# Patient Record
Sex: Male | Born: 1935 | Race: White | Hispanic: No | Smoking: Former smoker
Health system: Southern US, Community
[De-identification: ages and names within clinical notes are randomized; demographics above are authoritative.]

## PROBLEM LIST (undated history)

## (undated) DIAGNOSIS — N289 Disorder of kidney and ureter, unspecified: Secondary | ICD-10-CM

## (undated) DIAGNOSIS — I4891 Unspecified atrial fibrillation: Secondary | ICD-10-CM

## (undated) DIAGNOSIS — I2510 Atherosclerotic heart disease of native coronary artery without angina pectoris: Secondary | ICD-10-CM

## (undated) DIAGNOSIS — I4510 Unspecified right bundle-branch block: Secondary | ICD-10-CM

## (undated) DIAGNOSIS — I6529 Occlusion and stenosis of unspecified carotid artery: Secondary | ICD-10-CM

## (undated) DIAGNOSIS — I10 Essential (primary) hypertension: Secondary | ICD-10-CM

## (undated) DIAGNOSIS — M549 Dorsalgia, unspecified: Secondary | ICD-10-CM

## (undated) DIAGNOSIS — E7800 Pure hypercholesterolemia, unspecified: Secondary | ICD-10-CM

## (undated) DIAGNOSIS — E785 Hyperlipidemia, unspecified: Secondary | ICD-10-CM

## (undated) DIAGNOSIS — G4710 Hypersomnia, unspecified: Secondary | ICD-10-CM

## (undated) HISTORY — PX: CHOLECYSTECTOMY: SHX55

## (undated) HISTORY — PX: INTRAOCULAR LENS INSERTION: SHX110

## (undated) HISTORY — DX: Occlusion and stenosis of unspecified carotid artery: I65.29

## (undated) HISTORY — DX: Essential (primary) hypertension: I10

## (undated) HISTORY — DX: Dorsalgia, unspecified: M54.9

## (undated) HISTORY — DX: Atherosclerotic heart disease of native coronary artery without angina pectoris: I25.10

## (undated) HISTORY — PX: HERNIA REPAIR: SHX51

## (undated) HISTORY — DX: Hypersomnia, unspecified: G47.10

## (undated) HISTORY — DX: Pure hypercholesterolemia, unspecified: E78.00

## (undated) HISTORY — PX: CAROTID ENDARTERECTOMY: SUR193

## (undated) HISTORY — DX: Unspecified atrial fibrillation: I48.91

## (undated) HISTORY — DX: Disorder of kidney and ureter, unspecified: N28.9

## (undated) HISTORY — PX: CORONARY ARTERY BYPASS GRAFT: SHX141

## (undated) HISTORY — DX: Hyperlipidemia, unspecified: E78.5

## (undated) HISTORY — DX: Unspecified right bundle-branch block: I45.10

---

## 2006-09-15 ENCOUNTER — Encounter: Admission: RE | Admit: 2006-09-15 | Discharge: 2006-09-15 | Payer: Self-pay | Admitting: Cardiology

## 2007-10-12 ENCOUNTER — Encounter: Admission: RE | Admit: 2007-10-12 | Discharge: 2007-10-12 | Payer: Self-pay | Admitting: Cardiology

## 2007-10-19 ENCOUNTER — Inpatient Hospital Stay (HOSPITAL_COMMUNITY): Admission: RE | Admit: 2007-10-19 | Discharge: 2007-10-20 | Payer: Self-pay | Admitting: Cardiology

## 2007-11-07 ENCOUNTER — Ambulatory Visit (HOSPITAL_COMMUNITY): Admission: RE | Admit: 2007-11-07 | Discharge: 2007-11-07 | Payer: Self-pay | Admitting: Cardiology

## 2007-11-25 ENCOUNTER — Inpatient Hospital Stay (HOSPITAL_COMMUNITY): Admission: EM | Admit: 2007-11-25 | Discharge: 2007-12-02 | Payer: Self-pay | Admitting: Emergency Medicine

## 2007-11-25 ENCOUNTER — Encounter (INDEPENDENT_AMBULATORY_CARE_PROVIDER_SITE_OTHER): Payer: Self-pay | Admitting: Internal Medicine

## 2007-12-01 ENCOUNTER — Ambulatory Visit: Payer: Self-pay | Admitting: Infectious Disease

## 2007-12-13 ENCOUNTER — Ambulatory Visit (HOSPITAL_COMMUNITY): Admission: RE | Admit: 2007-12-13 | Discharge: 2007-12-13 | Payer: Self-pay | Admitting: Surgery

## 2008-01-03 ENCOUNTER — Ambulatory Visit (HOSPITAL_COMMUNITY): Admission: RE | Admit: 2008-01-03 | Discharge: 2008-01-03 | Payer: Self-pay | Admitting: General Surgery

## 2008-02-03 ENCOUNTER — Encounter: Admission: RE | Admit: 2008-02-03 | Discharge: 2008-02-03 | Payer: Self-pay | Admitting: Gastroenterology

## 2008-04-28 ENCOUNTER — Inpatient Hospital Stay (HOSPITAL_COMMUNITY): Admission: EM | Admit: 2008-04-28 | Discharge: 2008-05-16 | Payer: Self-pay | Admitting: Emergency Medicine

## 2008-05-07 ENCOUNTER — Encounter (INDEPENDENT_AMBULATORY_CARE_PROVIDER_SITE_OTHER): Payer: Self-pay | Admitting: Surgery

## 2008-05-07 ENCOUNTER — Encounter: Payer: Self-pay | Admitting: Surgery

## 2008-07-05 ENCOUNTER — Emergency Department (HOSPITAL_COMMUNITY): Admission: EM | Admit: 2008-07-05 | Discharge: 2008-07-06 | Payer: Self-pay | Admitting: Emergency Medicine

## 2008-07-09 ENCOUNTER — Inpatient Hospital Stay (HOSPITAL_COMMUNITY): Admission: EM | Admit: 2008-07-09 | Discharge: 2008-07-24 | Payer: Self-pay | Admitting: Emergency Medicine

## 2008-07-13 ENCOUNTER — Ambulatory Visit: Payer: Self-pay | Admitting: Surgery

## 2008-07-17 ENCOUNTER — Encounter: Payer: Self-pay | Admitting: Thoracic Surgery

## 2008-08-01 ENCOUNTER — Ambulatory Visit: Payer: Self-pay | Admitting: Thoracic Surgery

## 2008-08-01 ENCOUNTER — Encounter: Admission: RE | Admit: 2008-08-01 | Discharge: 2008-08-01 | Payer: Self-pay | Admitting: Thoracic Surgery

## 2008-08-29 ENCOUNTER — Ambulatory Visit: Payer: Self-pay | Admitting: Thoracic Surgery

## 2008-08-29 ENCOUNTER — Encounter: Admission: RE | Admit: 2008-08-29 | Discharge: 2008-08-29 | Payer: Self-pay | Admitting: Thoracic Surgery

## 2008-10-10 ENCOUNTER — Ambulatory Visit: Payer: Self-pay | Admitting: Thoracic Surgery

## 2008-10-10 ENCOUNTER — Encounter: Admission: RE | Admit: 2008-10-10 | Discharge: 2008-10-10 | Payer: Self-pay | Admitting: Thoracic Surgery

## 2009-04-05 ENCOUNTER — Ambulatory Visit (HOSPITAL_COMMUNITY): Admission: RE | Admit: 2009-04-05 | Discharge: 2009-04-06 | Payer: Self-pay | Admitting: General Surgery

## 2009-06-18 ENCOUNTER — Ambulatory Visit: Payer: Self-pay | Admitting: Thoracic Surgery

## 2009-06-18 ENCOUNTER — Encounter: Admission: RE | Admit: 2009-06-18 | Discharge: 2009-06-18 | Payer: Self-pay | Admitting: Thoracic Surgery

## 2009-07-30 IMAGING — CR DG CHEST 1V PORT
1 series · 1 of 1 positions shown · non-contrast
Comparison: 04/30/2008

CLINICAL DATA: Back and side pain.  Shortness of breath.

PORTABLE CHEST - 1 VIEW

[view not recorded]
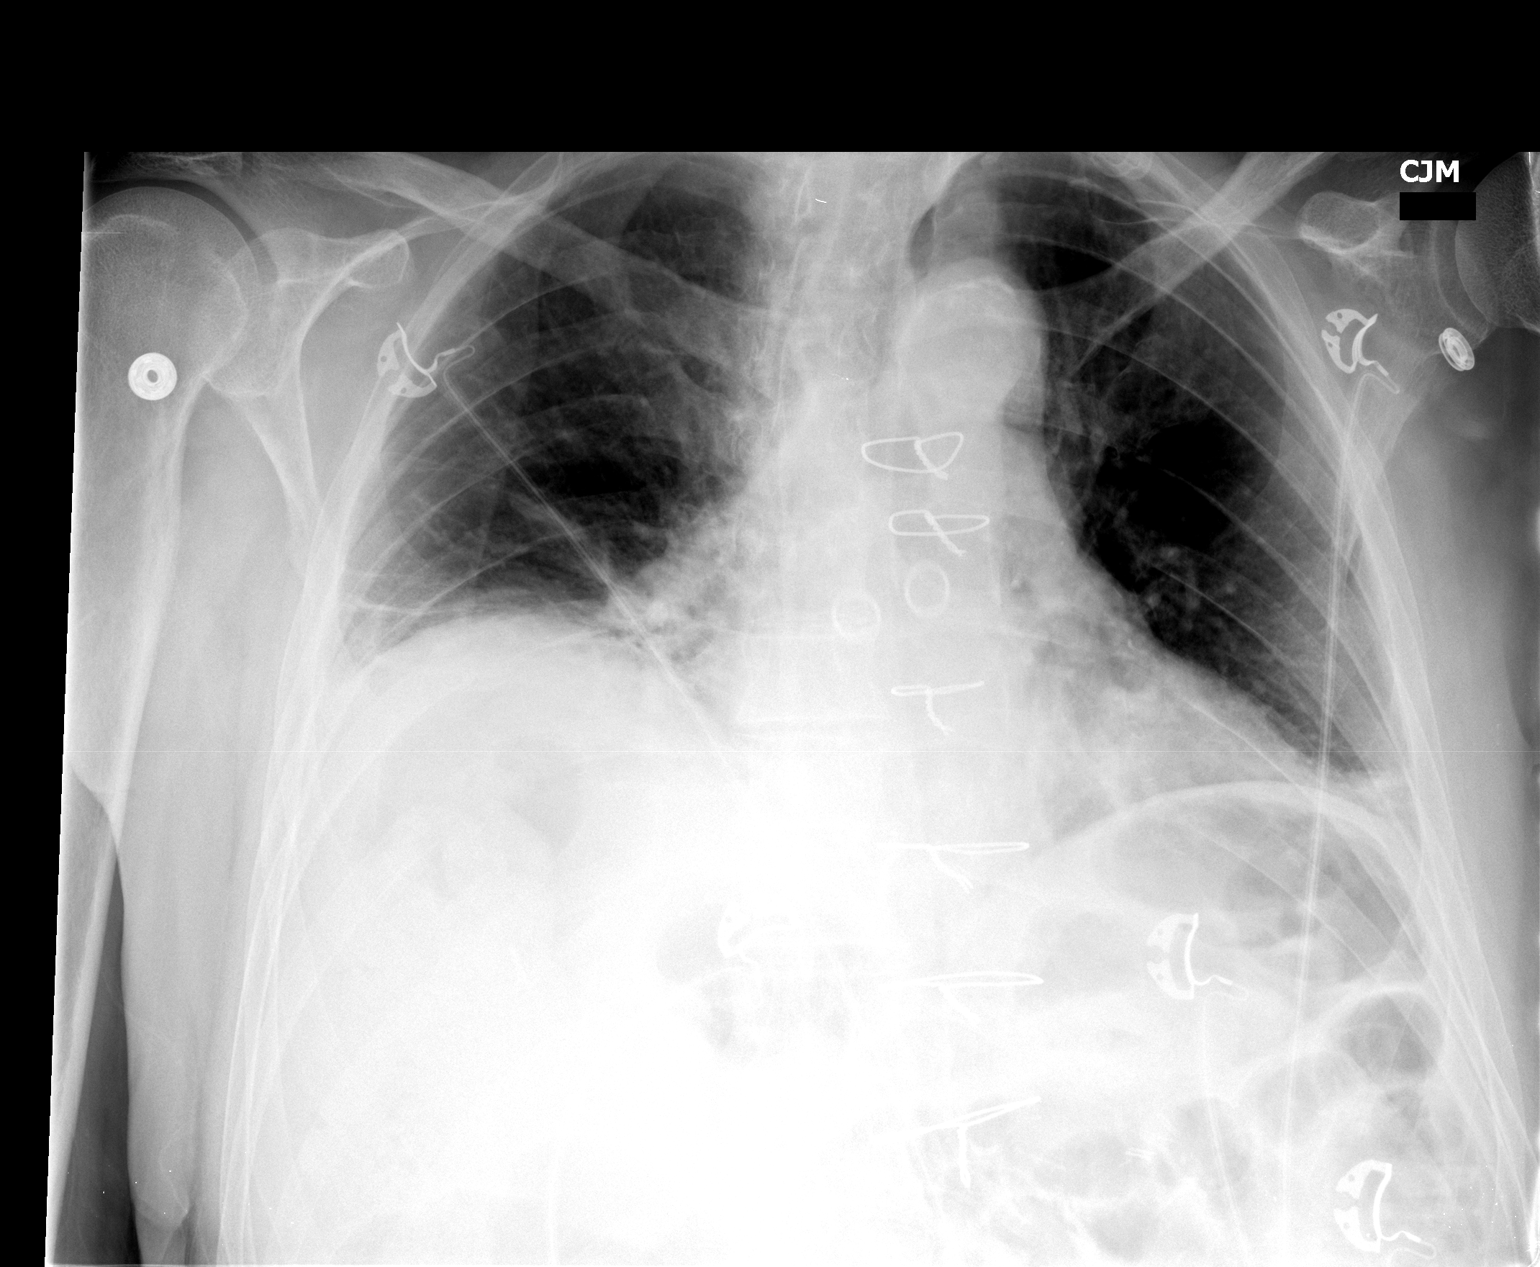

[1 of 1 positions shown; findings below may reference images not displayed]

FINDINGS: Single view of the chest demonstrates stable elevation of
the right hemidiaphragm.  There appears to be increased basilar
atelectasis, particularly on the right side.  The patient is status
post median sternotomy.  No evidence for frank pulmonary edema or
focal airspace disease.
IMPRESSION: Low lung volumes with bibasilar atelectasis.

## 2009-08-02 IMAGING — CR DG CHEST 1V PORT
1 series · 1 of 1 positions shown · non-contrast
Comparison: 07/05/2008

CLINICAL DATA: Pleuritic chest pain.  Weakness.  Shortness of
breath.

PORTABLE CHEST - 1 VIEW

[AP]
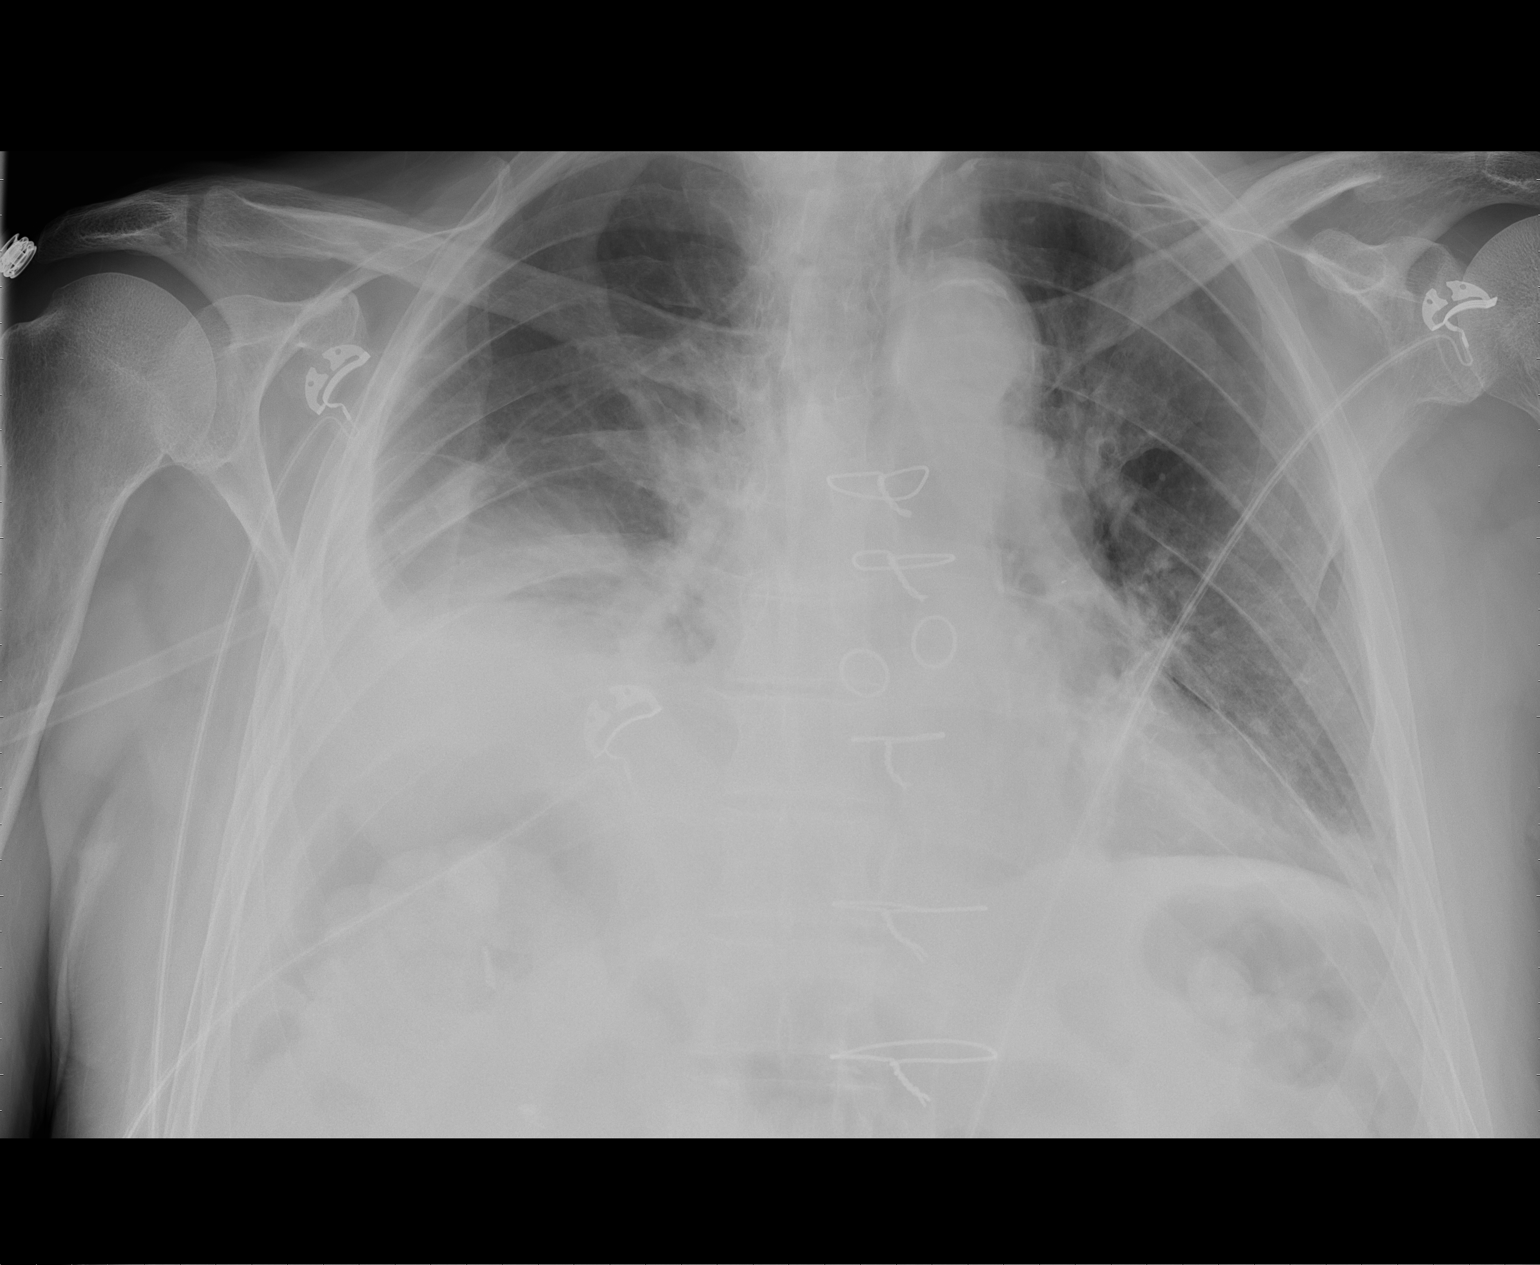

[1 of 1 positions shown; findings below may reference images not displayed]

FINDINGS: Right pleural effusion with right basilar atelectasis is
again noted.  There continue to be opacities at the left lung base,
similar to the prior exam, probably reflecting atelectasis.  Mildly
elevated right hemidiaphragm is again noted.

Prior CABG noted. Low lung volumes are present, causing crowding of
the pulmonary vasculature.
IMPRESSION: 1.  Airspace opacity the right lung base with right pleural
effusion and patchy opacities at the left lung base.  These
findings are not significantly changed compared to the CT scan from
07/05/2008.

## 2009-08-03 IMAGING — NM NM LIVER FUNCTION STUDY
2 series · 12 of 12 positions shown · non-contrast
Comparison: 05/09/2008 and 05/01/2008

CLINICAL DATA: Bile leak.

NM LIVER FUNCTION STUDY
TECHNIQUE: Sequential abdominal images were obtained for
approximately 60 minutes following intravenous injection of
radiopharmaceutical.
Radiopharmaceutical: 5 mCi technetium Choletec.

[Series 1: he hepatobiliary · 3.22mm/px · 6 of 56 frames shown (1 of 2)]
[frame 5/56]
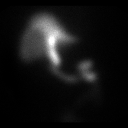
[frame 14/56]
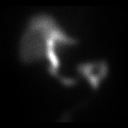
[frame 24/56]
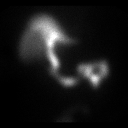
[frame 33/56]
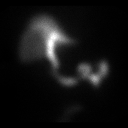
[frame 42/56]
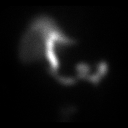
[frame 52/56]
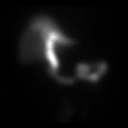

[Series 1: he hepatobiliary · 3.22mm/px · 6 of 60 frames shown (2 of 2)]
[frame 6/60]
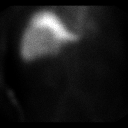
[frame 16/60]
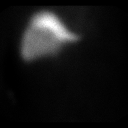
[frame 26/60]
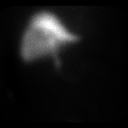
[frame 36/60]
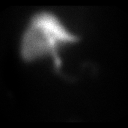
[frame 46/60]
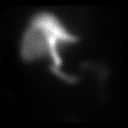
[frame 56/60]
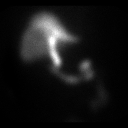

[12 of 12 positions shown; findings below may reference images not displayed]

FINDINGS: There is asymmetric appearing uptake in the liver which
is a stable finding and may be due to colonic interposition.  There
is prompt excretion into biliary tree.  Activity seen the small
bowel by 35 minutes.  No abnormal collections of the
radiopharmaceutical to suggest a bile leak.
IMPRESSION: Negative nuclear medicine hepatobiliary scan for bile leak.

## 2010-05-25 ENCOUNTER — Encounter: Payer: Self-pay | Admitting: Cardiology

## 2010-05-25 ENCOUNTER — Encounter: Payer: Self-pay | Admitting: Surgery

## 2010-05-26 ENCOUNTER — Encounter: Payer: Self-pay | Admitting: Cardiology

## 2010-05-26 ENCOUNTER — Encounter: Payer: Self-pay | Admitting: Gastroenterology

## 2010-08-05 LAB — TYPE AND SCREEN: ABO/RH(D): B NEG

## 2010-08-05 LAB — CBC
HCT: 40.8 % (ref 39.0–52.0)
Hemoglobin: 14 g/dL (ref 13.0–17.0)
MCHC: 34.2 g/dL (ref 30.0–36.0)
MCV: 94.5 fL (ref 78.0–100.0)
RDW: 13.1 % (ref 11.5–15.5)

## 2010-08-05 LAB — URINALYSIS, ROUTINE W REFLEX MICROSCOPIC
Ketones, ur: NEGATIVE mg/dL
Protein, ur: NEGATIVE mg/dL
Urobilinogen, UA: 0.2 mg/dL (ref 0.0–1.0)

## 2010-08-05 LAB — COMPREHENSIVE METABOLIC PANEL
Alkaline Phosphatase: 65 U/L (ref 39–117)
BUN: 22 mg/dL (ref 6–23)
Creatinine, Ser: 1.35 mg/dL (ref 0.4–1.5)
Glucose, Bld: 101 mg/dL — ABNORMAL HIGH (ref 70–99)
Potassium: 4.9 mEq/L (ref 3.5–5.1)
Total Protein: 6.6 g/dL (ref 6.0–8.3)

## 2010-08-05 LAB — PREPARE PLATELETS

## 2010-08-05 LAB — DIFFERENTIAL
Basophils Absolute: 0 10*3/uL (ref 0.0–0.1)
Basophils Relative: 0 % (ref 0–1)
Monocytes Relative: 6 % (ref 3–12)
Neutro Abs: 6.3 10*3/uL (ref 1.7–7.7)
Neutrophils Relative %: 79 % — ABNORMAL HIGH (ref 43–77)

## 2010-08-05 LAB — ABO/RH: ABO/RH(D): B NEG

## 2010-08-14 LAB — CBC
HCT: 31.2 % — ABNORMAL LOW (ref 39.0–52.0)
HCT: 34.4 % — ABNORMAL LOW (ref 39.0–52.0)
HCT: 38.8 % — ABNORMAL LOW (ref 39.0–52.0)
HCT: 39.3 % (ref 39.0–52.0)
HCT: 43.1 % (ref 39.0–52.0)
Hemoglobin: 10.6 g/dL — ABNORMAL LOW (ref 13.0–17.0)
Hemoglobin: 11.6 g/dL — ABNORMAL LOW (ref 13.0–17.0)
Hemoglobin: 11.8 g/dL — ABNORMAL LOW (ref 13.0–17.0)
Hemoglobin: 13.7 g/dL (ref 13.0–17.0)
Hemoglobin: 14.3 g/dL (ref 13.0–17.0)
MCHC: 34.2 g/dL (ref 30.0–36.0)
MCHC: 34.3 g/dL (ref 30.0–36.0)
MCHC: 35.1 g/dL (ref 30.0–36.0)
MCHC: 34.3 g/dL (ref 30.0–36.0)
MCHC: 34.7 g/dL (ref 30.0–36.0)
MCHC: 34.8 g/dL (ref 30.0–36.0)
MCHC: 35 g/dL (ref 30.0–36.0)
MCV: 90.8 fL (ref 78.0–100.0)
MCV: 91.6 fL (ref 78.0–100.0)
MCV: 92.9 fL (ref 78.0–100.0)
MCV: 91.8 fL (ref 78.0–100.0)
MCV: 92.1 fL (ref 78.0–100.0)
MCV: 92.2 fL (ref 78.0–100.0)
MCV: 93.2 fL (ref 78.0–100.0)
Platelets: 260 10*3/uL (ref 150–400)
Platelets: 279 10*3/uL (ref 150–400)
Platelets: 170 10*3/uL (ref 150–400)
Platelets: 183 10*3/uL (ref 150–400)
Platelets: 207 10*3/uL (ref 150–400)
RBC: 3.41 MIL/uL — ABNORMAL LOW (ref 4.22–5.81)
RBC: 3.67 MIL/uL — ABNORMAL LOW (ref 4.22–5.81)
RBC: 3.7 MIL/uL — ABNORMAL LOW (ref 4.22–5.81)
RBC: 4.21 MIL/uL — ABNORMAL LOW (ref 4.22–5.81)
RBC: 4.41 MIL/uL (ref 4.22–5.81)
RDW: 14.3 % (ref 11.5–15.5)
RDW: 14.6 % (ref 11.5–15.5)
RDW: 14.6 % (ref 11.5–15.5)
RDW: 14 % (ref 11.5–15.5)
RDW: 14.4 % (ref 11.5–15.5)
WBC: 17.5 10*3/uL — ABNORMAL HIGH (ref 4.0–10.5)
WBC: 7.7 10*3/uL (ref 4.0–10.5)
WBC: 9.8 10*3/uL (ref 4.0–10.5)
WBC: 11.2 10*3/uL — ABNORMAL HIGH (ref 4.0–10.5)
WBC: 9.4 10*3/uL (ref 4.0–10.5)

## 2010-08-14 LAB — POCT I-STAT 3, ART BLOOD GAS (G3+)
Bicarbonate: 21.9 mEq/L (ref 20.0–24.0)
O2 Saturation: 93 %
TCO2: 23 mmol/L (ref 0–100)
pCO2 arterial: 35.9 mmHg (ref 35.0–45.0)
pH, Arterial: 7.392 (ref 7.350–7.450)
pO2, Arterial: 67 mmHg — ABNORMAL LOW (ref 80.0–100.0)

## 2010-08-14 LAB — COMPREHENSIVE METABOLIC PANEL
ALT: 24 U/L (ref 0–53)
AST: 15 U/L (ref 0–37)
AST: 203 U/L — ABNORMAL HIGH (ref 0–37)
AST: 24 U/L (ref 0–37)
AST: 26 U/L (ref 0–37)
Albumin: 1.9 g/dL — ABNORMAL LOW (ref 3.5–5.2)
Albumin: 2.1 g/dL — ABNORMAL LOW (ref 3.5–5.2)
Albumin: 2.3 g/dL — ABNORMAL LOW (ref 3.5–5.2)
Alkaline Phosphatase: 100 U/L (ref 39–117)
BUN: 12 mg/dL (ref 6–23)
BUN: 12 mg/dL (ref 6–23)
BUN: 18 mg/dL (ref 6–23)
CO2: 27 mEq/L (ref 19–32)
CO2: 28 mEq/L (ref 19–32)
CO2: 24 mEq/L (ref 19–32)
Calcium: 8.3 mg/dL — ABNORMAL LOW (ref 8.4–10.5)
Calcium: 8.5 mg/dL (ref 8.4–10.5)
Calcium: 8.6 mg/dL (ref 8.4–10.5)
Calcium: 8.8 mg/dL (ref 8.4–10.5)
Chloride: 102 mEq/L (ref 96–112)
Chloride: 99 mEq/L (ref 96–112)
Creatinine, Ser: 1.01 mg/dL (ref 0.4–1.5)
Creatinine, Ser: 1.17 mg/dL (ref 0.4–1.5)
Creatinine, Ser: 1.18 mg/dL (ref 0.4–1.5)
GFR calc Af Amer: >60 mL/min (ref >60)
GFR calc Af Amer: >60 mL/min (ref >60)
GFR calc Af Amer: >60 mL/min (ref >60)
GFR calc Af Amer: >60 mL/min (ref >60)
GFR calc non Af Amer: >60 mL/min (ref >60)
GFR calc non Af Amer: >60 mL/min (ref >60)
GFR calc non Af Amer: 59 mL/min — ABNORMAL LOW (ref >60)
GFR calc non Af Amer: 60 mL/min — ABNORMAL LOW (ref >60)
Glucose, Bld: 107 mg/dL — ABNORMAL HIGH (ref 70–99)
Potassium: 4.3 mEq/L (ref 3.5–5.1)
Sodium: 139 mEq/L (ref 135–145)
Total Bilirubin: 0.7 mg/dL (ref 0.3–1.2)
Total Protein: 4.6 g/dL — ABNORMAL LOW (ref 6.0–8.3)

## 2010-08-14 LAB — POCT I-STAT, CHEM 8
Calcium, Ion: 1.05 mmol/L — ABNORMAL LOW (ref 1.12–1.32)
Creatinine, Ser: 1.3 mg/dL (ref 0.4–1.5)
Glucose, Bld: 118 mg/dL — ABNORMAL HIGH (ref 70–99)
Glucose, Bld: 139 mg/dL — ABNORMAL HIGH (ref 70–99)
HCT: 42 % (ref 39.0–52.0)
Hemoglobin: 13.6 g/dL (ref 13.0–17.0)
Hemoglobin: 14.3 g/dL (ref 13.0–17.0)
Potassium: 4 mEq/L (ref 3.5–5.1)
TCO2: 26 mmol/L (ref 0–100)
TCO2: 27 mmol/L (ref 0–100)

## 2010-08-14 LAB — CROSSMATCH: Antibody Screen: NEGATIVE

## 2010-08-14 LAB — TISSUE CULTURE

## 2010-08-14 LAB — BASIC METABOLIC PANEL
BUN: 18 mg/dL (ref 6–23)
CO2: 24 mEq/L (ref 19–32)
CO2: 29 mEq/L (ref 19–32)
CO2: 29 mEq/L (ref 19–32)
Calcium: 8 mg/dL — ABNORMAL LOW (ref 8.4–10.5)
Calcium: 8.6 mg/dL (ref 8.4–10.5)
Calcium: 8.7 mg/dL (ref 8.4–10.5)
Calcium: 8.8 mg/dL (ref 8.4–10.5)
Chloride: 101 mEq/L (ref 96–112)
Creatinine, Ser: 0.95 mg/dL (ref 0.4–1.5)
Creatinine, Ser: 1.16 mg/dL (ref 0.4–1.5)
Creatinine, Ser: 1.18 mg/dL (ref 0.4–1.5)
GFR calc Af Amer: >60 mL/min (ref >60)
GFR calc Af Amer: >60 mL/min (ref >60)
GFR calc Af Amer: >60 mL/min (ref >60)
GFR calc non Af Amer: 56 mL/min — ABNORMAL LOW (ref >60)
GFR calc non Af Amer: >60 mL/min (ref >60)
Glucose, Bld: 105 mg/dL — ABNORMAL HIGH (ref 70–99)
Sodium: 138 mEq/L (ref 135–145)
Sodium: 138 mEq/L (ref 135–145)

## 2010-08-14 LAB — LIPASE, BLOOD
Lipase: 138 U/L — ABNORMAL HIGH (ref 11–59)
Lipase: 17 U/L (ref 11–59)

## 2010-08-14 LAB — HEPATIC FUNCTION PANEL
ALT: 249 U/L — ABNORMAL HIGH (ref 0–53)
ALT: 50 U/L (ref 0–53)
AST: 40 U/L — ABNORMAL HIGH (ref 0–37)
AST: 44 U/L — ABNORMAL HIGH (ref 0–37)
Albumin: 2.7 g/dL — ABNORMAL LOW (ref 3.5–5.2)
Albumin: 3.6 g/dL (ref 3.5–5.2)
Alkaline Phosphatase: 83 U/L (ref 39–117)
Alkaline Phosphatase: 88 U/L (ref 39–117)
Bilirubin, Direct: 0.2 mg/dL (ref 0.0–0.3)
Bilirubin, Direct: 0.5 mg/dL — ABNORMAL HIGH (ref 0.0–0.3)
Indirect Bilirubin: 1.3 mg/dL — ABNORMAL HIGH (ref 0.3–0.9)
Total Bilirubin: 1.1 mg/dL (ref 0.3–1.2)
Total Bilirubin: 1.5 mg/dL — ABNORMAL HIGH (ref 0.3–1.2)
Total Protein: 5.8 g/dL — ABNORMAL LOW (ref 6.0–8.3)
Total Protein: 6.6 g/dL (ref 6.0–8.3)

## 2010-08-14 LAB — APTT
aPTT: 34 seconds (ref 24–37)
aPTT: 51 seconds — ABNORMAL HIGH (ref 24–37)

## 2010-08-14 LAB — POCT CARDIAC MARKERS: CKMB, poc: 2.2 ng/mL (ref 1.0–8.0)

## 2010-08-14 LAB — DIFFERENTIAL
Basophils Relative: 0 % (ref 0–1)
Basophils Relative: 1 % (ref 0–1)
Eosinophils Absolute: 0.1 10*3/uL (ref 0.0–0.7)
Eosinophils Relative: 1 % (ref 0–5)
Eosinophils Relative: 2 % (ref 0–5)
Lymphocytes Relative: 6 % — ABNORMAL LOW (ref 12–46)
Lymphs Abs: 0.7 10*3/uL (ref 0.7–4.0)
Lymphs Abs: 0.7 10*3/uL (ref 0.7–4.0)
Monocytes Absolute: 0.6 10*3/uL (ref 0.1–1.0)
Monocytes Absolute: 0.6 10*3/uL (ref 0.1–1.0)
Monocytes Relative: 7 % (ref 3–12)
Neutro Abs: 8.2 10*3/uL — ABNORMAL HIGH (ref 1.7–7.7)
Neutrophils Relative %: 77 % (ref 43–77)

## 2010-08-14 LAB — BODY FLUID CULTURE: Culture: NO GROWTH

## 2010-08-14 LAB — PROTIME-INR
INR: 1.5 (ref 0.00–1.49)
Prothrombin Time: 16.2 seconds — ABNORMAL HIGH (ref 11.6–15.2)
Prothrombin Time: 18.9 seconds — ABNORMAL HIGH (ref 11.6–15.2)

## 2010-08-18 LAB — TYPE AND SCREEN: ABO/RH(D): B NEG

## 2010-08-18 LAB — BASIC METABOLIC PANEL
BUN: 19 mg/dL (ref 6–23)
BUN: 40 mg/dL — ABNORMAL HIGH (ref 6–23)
CO2: 23 mEq/L (ref 19–32)
CO2: 25 mEq/L (ref 19–32)
CO2: 28 mEq/L (ref 19–32)
Calcium: 8.4 mg/dL (ref 8.4–10.5)
Chloride: 105 mEq/L (ref 96–112)
Chloride: 97 mEq/L (ref 96–112)
Creatinine, Ser: 1.04 mg/dL (ref 0.4–1.5)
Creatinine, Ser: 1.31 mg/dL (ref 0.4–1.5)
GFR calc Af Amer: 34 mL/min — ABNORMAL LOW (ref >60)
GFR calc Af Amer: >60 mL/min (ref >60)
Glucose, Bld: 118 mg/dL — ABNORMAL HIGH (ref 70–99)
Glucose, Bld: 122 mg/dL — ABNORMAL HIGH (ref 70–99)
Glucose, Bld: 94 mg/dL (ref 70–99)
Potassium: 3.7 mEq/L (ref 3.5–5.1)
Potassium: 4 mEq/L (ref 3.5–5.1)
Sodium: 137 mEq/L (ref 135–145)
Sodium: 139 mEq/L (ref 135–145)

## 2010-08-18 LAB — CBC
HCT: 24 % — ABNORMAL LOW (ref 39.0–52.0)
HCT: 25.9 % — ABNORMAL LOW (ref 39.0–52.0)
HCT: 30.4 % — ABNORMAL LOW (ref 39.0–52.0)
HCT: 31.2 % — ABNORMAL LOW (ref 39.0–52.0)
HCT: 33.3 % — ABNORMAL LOW (ref 39.0–52.0)
HCT: 33.4 % — ABNORMAL LOW (ref 39.0–52.0)
HCT: 33.7 % — ABNORMAL LOW (ref 39.0–52.0)
HCT: 35 % — ABNORMAL LOW (ref 39.0–52.0)
Hemoglobin: 10 g/dL — ABNORMAL LOW (ref 13.0–17.0)
Hemoglobin: 10.2 g/dL — ABNORMAL LOW (ref 13.0–17.0)
Hemoglobin: 10.7 g/dL — ABNORMAL LOW (ref 13.0–17.0)
Hemoglobin: 6.4 g/dL — CL (ref 13.0–17.0)
Hemoglobin: 8.1 g/dL — ABNORMAL LOW (ref 13.0–17.0)
Hemoglobin: 8.4 g/dL — ABNORMAL LOW (ref 13.0–17.0)
MCHC: 33.8 g/dL (ref 30.0–36.0)
MCHC: 34 g/dL (ref 30.0–36.0)
MCHC: 34.2 g/dL (ref 30.0–36.0)
MCHC: 34.4 g/dL (ref 30.0–36.0)
MCHC: 34.4 g/dL (ref 30.0–36.0)
MCHC: 34.4 g/dL (ref 30.0–36.0)
MCHC: 34.8 g/dL (ref 30.0–36.0)
MCHC: 34.9 g/dL (ref 30.0–36.0)
MCV: 91.7 fL (ref 78.0–100.0)
MCV: 92.2 fL (ref 78.0–100.0)
MCV: 92.2 fL (ref 78.0–100.0)
MCV: 92.6 fL (ref 78.0–100.0)
MCV: 92.9 fL (ref 78.0–100.0)
MCV: 92.9 fL (ref 78.0–100.0)
Platelets: 158 10*3/uL (ref 150–400)
Platelets: 251 10*3/uL (ref 150–400)
Platelets: 265 10*3/uL (ref 150–400)
Platelets: 270 10*3/uL (ref 150–400)
Platelets: 274 10*3/uL (ref 150–400)
Platelets: 287 10*3/uL (ref 150–400)
Platelets: 376 10*3/uL (ref 150–400)
Platelets: 437 10*3/uL — ABNORMAL HIGH (ref 150–400)
RBC: 2 MIL/uL — ABNORMAL LOW (ref 4.22–5.81)
RBC: 3.14 MIL/uL — ABNORMAL LOW (ref 4.22–5.81)
RBC: 3.28 MIL/uL — ABNORMAL LOW (ref 4.22–5.81)
RBC: 3.77 MIL/uL — ABNORMAL LOW (ref 4.22–5.81)
RDW: 12.3 % (ref 11.5–15.5)
RDW: 12.8 % (ref 11.5–15.5)
RDW: 12.9 % (ref 11.5–15.5)
RDW: 12.9 % (ref 11.5–15.5)
RDW: 13.2 % (ref 11.5–15.5)
RDW: 13.8 % (ref 11.5–15.5)
RDW: 13.8 % (ref 11.5–15.5)
RDW: 14.6 % (ref 11.5–15.5)
WBC: 11.1 10*3/uL — ABNORMAL HIGH (ref 4.0–10.5)
WBC: 13 10*3/uL — ABNORMAL HIGH (ref 4.0–10.5)
WBC: 13.6 10*3/uL — ABNORMAL HIGH (ref 4.0–10.5)
WBC: 33.2 10*3/uL — ABNORMAL HIGH (ref 4.0–10.5)
WBC: 8.4 10*3/uL (ref 4.0–10.5)
WBC: 9.1 10*3/uL (ref 4.0–10.5)

## 2010-08-18 LAB — DIFFERENTIAL
Basophils Absolute: 0 10*3/uL (ref 0.0–0.1)
Basophils Absolute: 0 10*3/uL (ref 0.0–0.1)
Basophils Relative: 0 % (ref 0–1)
Basophils Relative: 0 % (ref 0–1)
Eosinophils Relative: 1 % (ref 0–5)
Monocytes Absolute: 0.9 10*3/uL (ref 0.1–1.0)
Monocytes Absolute: 1 10*3/uL (ref 0.1–1.0)
Neutro Abs: 11.3 10*3/uL — ABNORMAL HIGH (ref 1.7–7.7)
Neutro Abs: 17.2 10*3/uL — ABNORMAL HIGH (ref 1.7–7.7)
Neutrophils Relative %: 89 % — ABNORMAL HIGH (ref 43–77)

## 2010-08-18 LAB — CULTURE, BLOOD (ROUTINE X 2)
Culture: NO GROWTH
Culture: NO GROWTH
Culture: NO GROWTH

## 2010-08-18 LAB — CLOSTRIDIUM DIFFICILE EIA
C difficile Toxins A+B, EIA: NEGATIVE
C difficile Toxins A+B, EIA: NEGATIVE

## 2010-08-18 LAB — ABO/RH: ABO/RH(D): B NEG

## 2010-08-18 LAB — PROTIME-INR
INR: 1.3 (ref 0.00–1.49)
Prothrombin Time: 16.9 seconds — ABNORMAL HIGH (ref 11.6–15.2)

## 2010-08-18 LAB — HEPATIC FUNCTION PANEL
Bilirubin, Direct: 0.3 mg/dL (ref 0.0–0.3)
Indirect Bilirubin: 1 mg/dL — ABNORMAL HIGH (ref 0.3–0.9)

## 2010-08-18 LAB — COMPREHENSIVE METABOLIC PANEL
ALT: 38 U/L (ref 0–53)
Alkaline Phosphatase: 115 U/L (ref 39–117)
BUN: 22 mg/dL (ref 6–23)
Chloride: 101 mEq/L (ref 96–112)
Glucose, Bld: 122 mg/dL — ABNORMAL HIGH (ref 70–99)
Potassium: 3.8 mEq/L (ref 3.5–5.1)
Sodium: 138 mEq/L (ref 135–145)
Total Bilirubin: 1.3 mg/dL — ABNORMAL HIGH (ref 0.3–1.2)

## 2010-08-18 LAB — CROSSMATCH: Antibody Screen: NEGATIVE

## 2010-08-18 LAB — HEMOGLOBIN AND HEMATOCRIT, BLOOD
HCT: 22.7 % — ABNORMAL LOW (ref 39.0–52.0)
Hemoglobin: 7.7 g/dL — CL (ref 13.0–17.0)

## 2010-08-18 LAB — CREATININE, SERUM
Creatinine, Ser: 1.92 mg/dL — ABNORMAL HIGH (ref 0.4–1.5)
GFR calc Af Amer: 41 mL/min — ABNORMAL LOW (ref >60)
GFR calc non Af Amer: 34 mL/min — ABNORMAL LOW (ref >60)

## 2010-08-18 LAB — URINE CULTURE
Culture: NO GROWTH
Special Requests: NEGATIVE

## 2010-08-18 LAB — POTASSIUM: Potassium: 5.1 mEq/L (ref 3.5–5.1)

## 2010-09-03 ENCOUNTER — Ambulatory Visit (HOSPITAL_COMMUNITY)
Admission: RE | Admit: 2010-09-03 | Discharge: 2010-09-03 | Disposition: A | Discharge status: Home / Self Care | Payer: Medicare Other | Source: Ambulatory Visit | Attending: General Surgery | Admitting: General Surgery

## 2010-09-03 ENCOUNTER — Other Ambulatory Visit (HOSPITAL_COMMUNITY): Payer: Self-pay | Admitting: General Surgery

## 2010-09-03 ENCOUNTER — Other Ambulatory Visit: Payer: Self-pay | Admitting: General Surgery

## 2010-09-03 ENCOUNTER — Encounter (HOSPITAL_COMMUNITY): Payer: Medicare Other

## 2010-09-03 DIAGNOSIS — I10 Essential (primary) hypertension: Secondary | ICD-10-CM

## 2010-09-03 DIAGNOSIS — I519 Heart disease, unspecified: Secondary | ICD-10-CM

## 2010-09-03 DIAGNOSIS — Z01812 Encounter for preprocedural laboratory examination: Secondary | ICD-10-CM | POA: Insufficient documentation

## 2010-09-03 DIAGNOSIS — Z01818 Encounter for other preprocedural examination: Secondary | ICD-10-CM | POA: Insufficient documentation

## 2010-09-03 LAB — SURGICAL PCR SCREEN
MRSA, PCR: NEGATIVE
Staphylococcus aureus: POSITIVE — AB

## 2010-09-03 LAB — CBC
Hemoglobin: 13.9 g/dL (ref 13.0–17.0)
MCHC: 33.7 g/dL (ref 30.0–36.0)
Platelets: 135 10*3/uL — ABNORMAL LOW (ref 150–400)
RDW: 13 % (ref 11.5–15.5)

## 2010-09-03 LAB — BASIC METABOLIC PANEL
Calcium: 9.9 mg/dL (ref 8.4–10.5)
GFR calc Af Amer: 54 mL/min — ABNORMAL LOW (ref >60)
GFR calc non Af Amer: 44 mL/min — ABNORMAL LOW (ref >60)
Sodium: 143 mEq/L (ref 135–145)

## 2010-09-08 ENCOUNTER — Observation Stay (HOSPITAL_COMMUNITY)
Admission: RE | Admit: 2010-09-08 | Discharge: 2010-09-09 | Disposition: A | Discharge status: Home / Self Care | Payer: Medicare Other | Source: Ambulatory Visit | Attending: General Surgery | Admitting: General Surgery

## 2010-09-08 DIAGNOSIS — Z91199 Patient's noncompliance with other medical treatment and regimen due to unspecified reason: Secondary | ICD-10-CM | POA: Insufficient documentation

## 2010-09-08 DIAGNOSIS — G4733 Obstructive sleep apnea (adult) (pediatric): Secondary | ICD-10-CM | POA: Insufficient documentation

## 2010-09-08 DIAGNOSIS — I2510 Atherosclerotic heart disease of native coronary artery without angina pectoris: Secondary | ICD-10-CM | POA: Insufficient documentation

## 2010-09-08 DIAGNOSIS — I4891 Unspecified atrial fibrillation: Secondary | ICD-10-CM | POA: Insufficient documentation

## 2010-09-08 DIAGNOSIS — Z951 Presence of aortocoronary bypass graft: Secondary | ICD-10-CM | POA: Insufficient documentation

## 2010-09-08 DIAGNOSIS — K4090 Unilateral inguinal hernia, without obstruction or gangrene, not specified as recurrent: Principal | ICD-10-CM | POA: Insufficient documentation

## 2010-09-08 DIAGNOSIS — Z9119 Patient's noncompliance with other medical treatment and regimen: Secondary | ICD-10-CM | POA: Insufficient documentation

## 2010-09-08 DIAGNOSIS — K219 Gastro-esophageal reflux disease without esophagitis: Secondary | ICD-10-CM | POA: Insufficient documentation

## 2010-09-08 DIAGNOSIS — I10 Essential (primary) hypertension: Secondary | ICD-10-CM | POA: Insufficient documentation

## 2010-09-09 NOTE — Op Note (Signed)
NAME:  Jesus Beck, Jesus Beck NO.:  [id number]  MEDICAL RECORD NO.:  [id number]           PATIENT TYPE:  O  LOCATION:  DAYL                         FACILITY:  [facility]  PHYSICIAN:  Sharlet Salina T. Maite Burlison, M.D.DATE OF BIRTH:  05/06/1925  DATE OF PROCEDURE:  09/08/2010 DATE OF DISCHARGE:                              OPERATIVE REPORT   PREOPERATIVE DIAGNOSIS:  Right inguinal hernia.  POSTOPERATIVE DIAGNOSIS:  Right inguinal hernia.  SURGICAL PROCEDURE:  Open repair of right inguinal hernia with mesh.  SURGEON:  Lorne Skeens. Shawnta Schlegel, M.D.  ANESTHESIA:  General.  BRIEF HISTORY:  Mr. [name] is an 77 year old male who presents with an increasing uncomfortable bulge in his right groin and on exam has a moderate-sized reducible slightly tender right inguinal hernia.  The patient has had an open hernia repair on the left previously and did well with this and is on chronic anticoagulation and we therefore after discussion have elected to proceed with open repair of his right inguinal hernia with mesh.  This procedure, indications, risks of anesthetic complications, bleeding, infection, recurrence were discussed and understood.  The patient stopped his Coumadin 5 days preoperatively. He is now brought to the operating room for the procedure.  DESCRIPTION OF OPERATION:  The patient was brought to the operating room, placed in the supine position on the operating room table and general laryngeal mask anesthesia was induced.  The right groin was widely sterilely prepped and draped.  He received preoperative IV antibiotics.  Correct patient and procedure were verified.  Oblique incision was made in the right groin.  Dissection was carried down through the subcutaneous tissues and cautery.  The external oblique was exposed down to the external ring and the inguinal ligament.  It was divided along the line of its fibers through the external ring.  Both the ilioinguinal nerves and  hypogastric nerves were identified and protected through the remainder of the dissection.  The cord was dissected up above the floor of the pubic tubercle and cremasteric fibers, was divided back to the internal ring completely freeing the cord to the internal ring.  There was obviously a moderate-sized direct hernia.  Dissection within the cord structures also revealed a small to moderate sized indirect sac which was dissected away from cord structures up to the level of the internal ring at which point it was suture ligated and divided.  The stump retracted above the internal ring.  The hernia defect at the floor was mobilized from surrounding soft tissue completely and defined.  I did imbricate the floor of the anal canal with a running 2-0 Prolene.  Following this, a piece of Parietex mesh was chosen and trimmed to size to fit the floor of the anal canal with tails around the cord of the internal ring.  It was sutured nicely in the pubic tubercle and then to the shelving edge of inguinal ligament working medial to lateral.  Medially the mesh was sutured into the rectus sheath with interrupted 2-0 Prolene.  The tails were then tacked to the lateral to the cord with interrupted Prolene creating a new internal ring  snug to a fingertip.  The cord and ilioinguinal nerves were returned to their anatomic position.  The soft tissue was infiltrated with Marcaine with epinephrine.  An On-Q pain catheter was introduced percutaneously deep to the external oblique and directed along the cord and lying on the mesh.  Following this, the external oblique was closed over the cord with running 2-0 Vicryl. Scarpa fascia was closed with running 2-0 Vicryl and the skin with subcuticular 4-0 Monocryl and Dermabond.  Sponge and needle count was correct.  The patient was taken to recovery in good condition.     Lorne Skeens. Myley Bahner, M.D.     Tory Emerald  D:  09/08/2010  T:  09/08/2010  Job:   [id number]  Electronically Signed by Glenna Fellows M.D. on 09/09/2010 11:09:47 AM

## 2010-09-16 NOTE — Consult Note (Signed)
[name], [name] NO.:  [id number]   MEDICAL RECORD NO.:  [id number]          PATIENT TYPE:  INP   LOCATION:  5120                         FACILITY:  [facility]   PHYSICIAN:  Evelene Croon, M.D.     DATE OF BIRTH:  [birth year]   DATE OF CONSULTATION:  07/13/2008  DATE OF DISCHARGE:                                 CONSULTATION   REASON FOR CONSULTATION:  Loculated right pleural effusion, possible  empyema.   CLINICAL HISTORY:  I was asked by Dr. Johna Sheriff to evaluate Jesus Beck for  a large right pleural effusion that appeared loculated by CT scan and  ultrasound.  He is an 77 year old gentleman with a history of recurrent  acute cholecystitis, who had undergone coronary stenting in June 2009  after an acute MI.  He had an episode of acute cholecystitis during that  time and was treated with percutaneous cholecystostomy tube placement.  This tube was subsequently removed by Dr. Johna Sheriff in the office and  plans are made to proceed with elective cholecystectomy once the patient  could be taken off Plavix.  The patient was brought into the hospital  and placed on Lovenox twice daily as a bridge while the Plavix was  washed out.  He subsequently underwent laparoscopic cholecystectomy on  May 07, 2008, by Dr. Michaell Cowing, which sound like it was a long procedure  with greater than 60 minutes of lysis of adhesions.  He had a slow  postoperative course with postoperative anemia on postop day #3, with a  hemoglobin of 6.4.  He had leukocytosis of 23,000.  He was transfused  and treated for urinary tract infection.  He gradually improved and was  discharged home on May 16, 2008.  The patient presented again on  July 09, 2008, with complaints of pleuritic right-sided chest pain and  shortness of breath.  He was diagnosed with subhepatic fluid collection  by CT scan.  It was felt most likely to be a hematoma.  This was drained  percutaneously by Interventional Radiology and  showed hematoma with all  cultures negative.  There was no bile leak through the catheter.  The  patient has had a scan to rule out bile leak also.  He had a small right  pleural effusion at the time of admission.  Subsequent CT scan showed  that there was a moderate-sized right pleural effusion with the right  lower lobe atelectasis or infiltrate.  Since admission, the patient has  had repeated chest x-rays that have shown progression of the right  pleural effusion.  The Pulmonary Medicine Team was consulted and Dr.  Levy Pupa saw the patient and did an ultrasound of the right chest  and felt that this was a loculated effusion and may not be amenable to  thoracentesis drainage.   REVIEW OF SYSTEMS:  GENERAL:  He denies any fever or chills.  He denies  any recent weight changes.  He denies fatigue.  EYES:  Negative.  ENT:  Negative.  ENDOCRINE:  He denies diabetes and hypothyroidism.  CARDIOVASCULAR:  He denies any anginal  chest pain.  He has had shortness  of breath.  He does report pleuritic right chest pain radiating to the  scapula on the right side.  He denies any peripheral edema.  He denies  palpitations.  RESPIRATORY:  He denies cough or sputum production.  GI:  He has had no nausea or vomiting.  He denies melena and bright red  blood per rectum.  GU:  He denies dysuria and hematuria.  MUSCULOSKELETAL:  He denies arthralgias and myalgias.  NEUROLOGIC:  He denies any focal weakness or numbness.  Denies dizziness  and syncope.  He has never had a TIA or a stroke.   ALLERGIES:  None.   PAST MEDICAL HISTORY:  Significant for coronary disease, status post  coronary artery bypass graft surgery in the past and status post  coronary stenting.  He has history of atrial fibrillation.  He has a  history of hypertension.  He has a history of hyperlipidemia.  He is  status post cholecystectomy.  He is status post carotid endarterectomy.  He is status post lens implantation.    SOCIAL HISTORY:  He lives with his daughter.  He is a previous smoker.  He denies alcohol use.   FAMILY HISTORY:  Negative.   PHYSICAL EXAMINATION:  VITAL SIGNS:  He is afebrile.  Blood pressure  146/54, pulse 62 and regular, respiratory rate is 16 and unlabored.  Oxygen saturation on room air is 93%-95%.  GENERAL:  He is an elderly white male, in no distress.  HEENT:  Normocephalic and atraumatic.  Pupils are equal and reactive to  light and accommodation.  Extraocular muscles are intact.  Throat is  clear.  NECK:  Normal carotid pulses bilaterally.  There are no bruits.  There  is no adenopathy or thyromegaly.  CARDIAC:  Regular rate and rhythm with normal S1 and S2.  There is no  murmur, rub, or gallop.  There is a well-healed sternotomy incision.  The sternum is stable.  LUNGS:  Decreased breath sounds over the right lower lobe with tubular  breath sounds there.  There is a percutaneous drainage through the right  lateral lower chest.  ABDOMINAL:  Active bowel sounds.  His abdomen is soft, flat, and  nontender.  There are no palpable masses or organomegaly.  EXTREMITY:  No peripheral edema.  Pedal pulses are palpable bilaterally.  SKIN:  Warm and dry.  NEUROLOGIC:  Alert and oriented x3.  Motor and sensory exams are grossly  normal.   LABORATORY EXAMINATION:  All fluid culture to be negative.  His white  blood cell count was 10.3 on 07/12/2008.  Hemoglobin 12.8, platelet  count 207,000.  His electrolytes were normal with a BUN of 12,  creatinine 1.0.  Total bilirubin was elevated at 2.8.  Alkaline  phosphatase 232, SGOT 203, SGPT 522.  Albumin was low at 2.3.  His  lipase was elevated at 138, amylase was elevated at 170.  His BNP was  169.   IMPRESSION:  [name] has a moderate-sized loculated right pleural  effusion with right lower lobe atelectasis.  This has progressed since  admission and maybe related to the percutaneous subhepatic drain, which  may traverse the lower  portion in the right pleural space.  He currently  has no signs of infection and is clinically stable.  I suspect this will  probably require a right VATS for complete drainage and re-expansion of  the lung.  I discussed this with him.  His daughter is not  here at this  time.  We will plan to follow him over the weekend and may plan to  perform this procedure early next week.      Evelene Croon, M.D.  Electronically Signed     BB/MEDQ  D:  07/14/2008  T:  07/14/2008  Job:  [id number]

## 2010-09-16 NOTE — H&P (Signed)
NAMELUDGER, BONES NO.:  [id number]   MEDICAL RECORD NO.:  [id number]          PATIENT TYPE:  INP   LOCATION:  3729                         FACILITY:  [facility]   PHYSICIAN:  Lyn Records, M.D.   DATE OF BIRTH:  [birth year]   DATE OF ADMISSION:  04/28/2008  DATE OF DISCHARGE:                              HISTORY & PHYSICAL   REASON FOR ADMISSION:  Left subcostal and parasternal chest discomfort.   SUBJECTIVE:  The patient is an 77 year old gentleman with a prior  history of coronary artery bypass grafting in 1999 and drug-eluting  stent implantation in the LAD beyond the LIMA graft insertion site in  June 2009.  For the past 1-2 months, he has experienced left parasternal  and left subcostal discomfort with exertion that he describes as a gas  pain.  Prior to last evening, the discomfort would go away with rest.  After eating supper last evening, he climbs stairs to go to bed and  began having the subcostal and parasternal chest discomfort.  It did not  resolve with rest.  He denies ongoing chest discomfort but states that  last evening the discomfort was quite severe.  It did not occur to him  to try a sublingual nitroglycerin.  He is being admitted to the hospital  now to rule out unstable angina and to determine the source of pain.   SIGNIFICANT MEDICAL PROBLEMS:  1. Hypertension.  2. Hyperlipidemia.  3. Coronary artery disease with 1999 coronary artery bypass grafting      which included a LIMA to the LAD, a saphenous vein graft to the      first diagonal, and a saphenous vein graft to the RCA.  The patient      also had a drug-eluting stent placed in the mid LAD this past June      2009 by Dr. Eldridge Dace.  4. Cholelithiasis, treated with cholecystostomy in November 2009.  5. History of paroxysmal atrial fibrillation.  6. Chronic right bundle-branch block.  7. Bilateral carotid endarterectomy in 2004.   MEDICATIONS:  1. Sotalol 40 mg b.i.d.  2.  Isosorbide mononitrate 60 mg per day.  3. Plavix 75 mg per day.  4. Atacand 16 mg per day.  5. Simvastatin 40 mg per day.  6. Aspirin 325 mg per day.  7. Amlodipine 5 mg daily.  8. Tramadol 50 mg as needed for pain.   SOCIAL HISTORY:  The patient is widowed.  He smoked a pipe but quit  greater than 5 years ago.  He has an occasional alcoholic beverage.   FAMILY HISTORY:  Noncontributory.   OBJECTIVE:  On exam, the patient is in no acute distress.  The blood  pressure is 160/90, heart rate 98.  He is afebrile.  The cardiac monitor  reveals sinus tachycardia.  He has a left carotid bruit.  No jugular  vein distention is noted.  The lungs are clear to auscultation and  percussion.  Cardiac exam reveals no gallop, click, rub, or murmur.  On  inspiration, however, a soft S4 gallop, summation may be  heard.  The  abdomen is soft.  There is no tenderness in the left or right upper  quadrant.  Bowel sounds are normal.  Extremities reveal no edema.  The  pulses are 2+ in the femorals, the posterior tibials, and the radials  bilaterally.   EKG reveals right bundle, leftward axis, first-degree AV block, and no  acute ST-T wave change.  His EKG is unchanged when compared to a December 02, 2007, tracing.   The point-of-care, CK-MB is 17 and a CK-MB on standard laboratory  testing is 10.2.  Both troponin measurements were normal.  Chest x-ray  reveals cardiomegaly but is otherwise unremarkable.   ASSESSMENT:  1. Left subcostal and parasternal discomfort of uncertain etiology.      Ischemic origin is suspected.  Rule out recrudescence of      gallbladder disease.  Rule out reflux esophagitis or other      gastrointestinal source.  2. Hypertension.  3. History of paroxysmal atrial fibrillation.   PLAN:  1. Check amylase/lipase.  2. IV nitroglycerin and heparin.  3. May need further cardiac testing.  4. If he develops any abdominal discomfort, may need a repeat general      surgical  evaluation.      Lyn Records, M.D.  Electronically Signed     HWS/MEDQ  D:  04/28/2008  T:  04/29/2008  Job:  [id number]   cc:   Armanda Magic, M.D.  Deirdre Peer. Polite, M.D.

## 2010-09-16 NOTE — Discharge Summary (Signed)
Jesus Beck, Jesus Beck NO.:  [id number]   MEDICAL RECORD NO.:  [id number]          PATIENT TYPE:  INP   LOCATION:  3728                         FACILITY:  [facility]   PHYSICIAN:  Juanetta Gosling, MDDATE OF BIRTH:  June 05, 1925   DATE OF ADMISSION:  04/28/2008  DATE OF DISCHARGE:  05/16/2008                               DISCHARGE SUMMARY   ADMITTING PHYSICIAN:  Lyn Records, MD   DISCHARGING PHYSICIAN:  Troy Sine. Dwain Sarna, MD   OPERATIVE PHYSICIAN:  [name], MD   PRIMARY CARDIOLOGIST:  Armanda Magic, MD   CHIEF COMPLAINT/REASON FOR ADMISSION:  Mr. Knotts is an 77 year old male  patient with significant cardiac history including prior CABG as well as  drug-eluting stent in June 2009 and associated acute MI.  He is  currently on Plavix.  He also has a history during that admission in  June experiencing acute cholecystitis requiring percutaneous  cholecystostomy tube placement.  He had subsequently followed up with  Dr. Johna Sheriff in the office and the percutaneous drain had been  discontinued, and Dr. Johna Sheriff had been in communication with Dr. Mayford Knife  with plans to eventually proceed with elective cholecystectomy once Dr.  Mayford Knife felt it was safe for the patient to have Plavix discontinued with  a 5-7 days necessary preoperatively.   The patient did present to the ER on April 28, 2008, with left  parasternal left subcostal discomfort with exertion that he describes as  gas pain.  The discomfort would go away with rest.  He ate supper the  evening prior but also climbed the stairs to go to bed and began having  subcostal and parasternal chest discomfort that did not resolve with  rest.  There by making it difficult to differentiate whether this was  ischemic pain or GI pain.  The evening prior to pain was very very  severe, worsen it had ever been.  The patient did not attempt to use  sublingual nitroglycerin.  He presented to the ER where Cardiology  evaluated him.  His vital signs were stable.  He was mildly hypertensive  with a BP of 160/90, sinus tachycardia on the telemetry monitor.  No  JVD.  A soft S4.  Abdomen was soft without tenderness in either upper  quadrants.  No peripheral edema.  EKG was stable with first degree AV  block, right bundle-branch block, and this was comparable to December 02, 2007, EKG.  Initial point-of-care enzymes were negative.  Dr. Katrinka Blazing  admitted the patient was following diagnoses.  1. Left subcostal and parasternal chest discomfort, uncertain of      cardiac versus GI etiology.  2. Known history of acute cholecystitis and cholelithiasis and prior      cholecystostomy tube.  3. Hypertension, moderate control.  4. History of paroxysmal atrial fibrillation.  5. History of known CAD, on Plavix and aspirin.   HOSPITAL COURSE:  The patient was admitted by the cardiac team where  subsequent workup revealed no ischemic etiology for his pain.  He did  have a mildly elevated BNP at 717, but otherwise,  did not appear to have  any clinical signs consistent with acute CHF.   After several days in the hospital, the patient was able to clarify that  actually his pain was more in the stomach and has been reoccurring since  hospitalization.  Labs were checked that revealed a total bilirubin of  2.0, and given his history of known gallbladder disease, surgical  consultation was obtained.  Dr. Carolynne Edouard initially saw the patient since he  was on-call for [facility].  The patient's white count at that time  was found to be 15,000.  All LFTs were normal except for a bilirubin of  2.  Dr. Carolynne Edouard felt that the patient probably had a degree of chronic  cholecystitis with an acute exacerbation as well as cholelithiasis.  At  this point, there was some concern whether the patient would potentially  need percutaneous cholecystostomy tube for additional temporization if  could not come off Plavix for an extended period versus  going ahead and  proceeding with surgery if the patient could come off Plavix.   During this time frame, the hospital day #2 onward, Dr. Mayford Knife was not  available and Dr. Eldridge Dace was seeing the patient in her absence.  He  was initially reluctant to take the patient off Plavix without talking  to Dr. Mayford Knife first.  From a surgical standpoint, the patient's LFTs  began to trend upwards.  His AST increased to 173, ALT 156, alkaline  phosphatase 172, total bilirubin 2.4.  At this point, the patient had  stopped having pain and he was tolerating a low-fat diet without any  nausea as well.  He did have a white count that had increased to 30,000,  neutrophils were 83%.  Because of increase in LFTs and concerns of  recurrent issues after discharge home, Dr. Eldridge Dace talked with the  patient's family including risks of stent thrombosis off Plavix if  stopped.  Plans were at this point to go ahead and proceed with holding  the Plavix for 5 days but beginning Lovenox b.i.d. as a bridge to  minimize risk of stent thrombosis.   The patient remained hospitalized on Lovenox with plans to proceed with  cholecystectomy the following week once the patient has been off Plavix  for least 5 days.  During this time frame, the patient's white count  began to normalize and his LFTs began to trend downward towards normal.  He had no additional pain or nausea with diet.   The patient was subsequently taken to the OR on May 07, 2008, by Dr.  Michaell Cowing where he underwent a laparoscopic cholecystectomy with a normal  intraoperative cholangiogram.  He also had a laparoscopic lysis of  adhesions greater than 60 minutes due to the dense adhesions found with  dense inflammation as well involving the transverse colon and the colon  and omentum.  The patient was sent back to the telemetry floor to  recover.   In immediate postop period, postop days #1 and #2, the patient began to  complain of dizziness and not  feeling well.  His vital signs were  stable.  Blood pressure was stable on 163/67, heart rate was 65.  He was  tolerating clear liquids but had marked decrease in urinary output.  By  postop day #2, his white count had increased from an immediate postop  white count of 19,800 to 33,200.  In review of prior cultures, the  patient did have a urine culture from April 30, 2008, that  showed  30,000 colonies of Enterococcus, sensitive to Levaquin and ampicillin.  The patient was also having diarrhea and complaining of significant  weakness and mild crampy abdominal pain but no significant pain.  At  this point, it was uncertain what the etiology of the leukocytosis was.  Considerations were given to a flare and full development of a  Enterococcus urinary tract infection versus C. difficile colitis versus  other.  He was started empirically on IV Unasyn.  His creatinine had  also increased to 3.29, so aggressive IV fluid hydration was initiated.  His total bilirubin, which had briefly bumped to 1.7, postop was  decreasing down to 1.2.  But given the degree of inflammation,  adhesions, and difficulty with the laparoscopic cholecystectomy  procedure, Dr. Michaell Cowing opted to check a HIDA scan to make sure there was  no evidence of a bile leak.  Again on abdominal exam, his abdomen was  nondistended, nontender, incisions were stable, and he had normal active  bowel sounds.  The patient's hemoglobin was also checked and had  decreased to 9.  Cardiology was assisting in the management of his  medications, and because of the renal insufficiency and acute renal  failure, his ARB was placed on hold.  Subsequently early a.m.,  hemoglobin on postop day #3 revealed a hemoglobin had decreased to 6.4,  so the patient was ordered 1 unit of packed red blood cells with plans  to additionally transfuse.  After the first unit of packed red blood  cells if hemoglobin was still less than 8 and it was less than 8, so  he  received a second unit of packed red blood cells.  In regard to his  white count, this had decreased to 23,400.  With hydration, his  creatinine had decreased to 2.28.  His coags were normal.  He did have  positive occult blood in stool microscopic, but this is not unexpected  given the degree of manipulation of the bowels during surgery as well as  his significant diarrhea.  He had no frank blood per rectum.  Because of  the acute blood loss anemia felt to be related to an intraoperative  issue, Plavix and Lovenox were placed on hold.   For the next several days, the patient's Lovenox and Plavix remained on  hold.  His diet was slowly advanced.  Stool collections for C. diff were  negative.  His leukocytosis gradually improved.  Cardiology made  adjustments in medications as well.  The patient had some difficulty  with bradycardia, so sotalol dosage was adjusted.  The patient continued  to tolerate advancement in diet and his hemoglobin remained stable  without any additional transfusion requirements.  He remained on Unasyn  IV to empirically treat possible UTI.  His diet re-improved and his  creatinine decreased to 1.31 by postop day #4, so IV fluids were  decreased.   By postop day #8, the patient's hematocrit was stable at 33.6 and Dr.  Dwain Sarna who assumed the care of the patient this week opted to restart  Plavix without any Lovenox bridge and changed the patient over from the  Unasyn to p.o. Augmentin.   On postop day #9, the patient's vital signs were stable.  His  temperature was 97.2, BP 149/74, pulse 63 and regular, respirations 18.  He was sating 93-96% on room air.  Last creatinine had been checked on  May 14, 2008, and this was 1.04 with a creatinine clearance greater  than 60 mL.  Potassium was 3.7 and sodium 133.  The patient was having  bowel movements.  He had a CBC checked on postop day #9, which showed a  white count of 10,800, hemoglobin 11.6, platelets  274,000.  The patient  from the general surgical standpoint as well as cardiac standpoint was  doing fine, no abdominal pain, tolerating a diet.  No chest pain.  No  further bradycardia.  He was complaining of bilateral foot pain and  redness, especially pain in the right great toe.  The patient has a  history of gout in the past.  On exam, the patient's feet especially the  right great toe was warm and erythematous in appearance and quite  tender.  The patient does not normally take allopurinol or colchicine as  a gout preventative, so he was given a solitary dose of Toradol 15 mg IV  before discharge and started on colchicine 0.6 mg b.i.d. for total of 14  days since his creatinine clearance is greater than 60 mL and plans are  to, otherwise, let the patient return home today and follow up with  Surgery, Medicine, and Cardiology after discharge.  Because we are  restarting Plavix and he has had the issue of a postoperative bleed, it  is recommended that at some point would follow up with either the  surgeon, the cardiologist, or the primary care physician.  He has a  repeat H and H done.  Also since he has a history of renal insufficiency  although he is not on an ARB, we are starting colchicine for the gout.  It is recommended that he go ahead and have if not a BMET at least a  creatinine checked in the followup.   FINAL DISCHARGE DIAGNOSES:  1. Abdominal pain secondary to acute-on-chronic cholecystitis, known      cholelithiasis.  2. Status post laparoscopic cholecystectomy with extensive      laparoscopic lysis of adhesions.  3. History of coronary artery disease, prior coronary artery bypass      grafting and prior stent, on Plavix.  4. Acute blood loss anemia secondary to postoperative bleeding,      hemoglobin and hematocrit stable on date of discharge.  5. History of prior chronic renal insufficiency.  Current creatinine      clearance greater than 60.  6. Hypertension,  controlled.  7. Leukocytosis, improved multifactorial secondary to probable urinary      tract infection as well as reactive secondary to intra-abdominal      hematoma from bleed.   DISCHARGE MEDICATIONS:  The patient is to resume the following home  medications.  1. Plavix 75 mg daily.  2. Atacand 32 mg half a tab, this has been placed on hold by      Cardiology.  This may or may not be resumed on the date of      discharge.  Cardiology has not seen the patient yet.  3. Sotalol 80 mg one-half tablet daily.  4. Imdur 60 mg daily.  5. Simvastatin 40 mg daily.  6. Aspirin 325 mg daily.  7. Tylenol p.r.n.  8. Zantac p.r.n.  9. Protonix 40 mg daily.  10.Tramadol 50 mg b.i.d.   NEW MEDICATIONS:  1. Norvasc 5 mg daily.  2. Colchicine 0.6 mg b.i.d. for 14 days.  3. Augmentin 875 mg b.i.d. for 7 days.   DIET:  Low sodium, heart healthy.   ACTIVITY:  No restrictions.   WOUND CARE:  Not applicable.   FOLLOWUP:  1.  The patient is to followup with Dr. Michaell Cowing, [phone].  He needs to      call and to be seen in 7-10 days.  Recommend follow up H and H and      creatinine at followup.  2. Follow up with Dr. Mayford Knife as directed.  3. Follow up with Dr. Nehemiah Settle regarding your gout.      Allison L. Marya Landry, MD  Electronically Signed    ALE/MEDQ  D:  05/16/2008  T:  05/16/2008  Job:  [id number]   cc:   Armanda Magic, M.D.  [name], MD  Deirdre Peer Polite, M.D.

## 2010-09-16 NOTE — Consult Note (Signed)
NAME:  JARNELL, CORDARO NO.:  [id number]   MEDICAL RECORD NO.:  [id number]          PATIENT TYPE:  AMB   LOCATION:  DAY                          FACILITY:  [facility]   PHYSICIAN:  Ollen Gross. Vernell Morgans, M.D. DATE OF BIRTH:  1925-11-04   DATE OF CONSULTATION:  04/30/2008  DATE OF DISCHARGE:                                 CONSULTATION   We were asked to see Mr. Yerger in consultation by Dr. Eldridge Dace to  evaluate him for cholecystitis.   CHIEF COMPLAINT:  Epigastric pain.   Mr. Mitton is an 77 year old gentleman who was admitted couple of days ago  with some epigastric pain and episode of nausea.  This had been going on  for several days.  He has a strong history of coronary artery disease  and congestive heart failure and was brought in to make sure he did not  have an MI.  He actually had similar pains to this back in July when he  was found to have cholecystitis and was treated with a percutaneous  gallbladder drain.  At that same time, he also had a cardiac stent  placed and was started on Plavix.  He is followed by Dr. Johna Sheriff for  this.  He currently denies any pain and has tolerated the diet in the  last day or so without any difficulties.   His past medical history is significant for:  1. Coronary artery disease.  2. Congestive heart failure.  3. Hyperlipidemia.  4. Hypertension.  5. Paroxysmal atrial fibrillation.  6. Right bundle-branch block.  7. Carotid vascular disease.  8. Gallstones with cholecystitis.   Past surgical history is significant for:  1. Coronary artery bypass grafting.  2. Placement of a stent.  3. Carotid endarterectomies.  4. Percutaneous cholecystostomy drainage tube.   Medications include sotalol, isosorbide, Plavix, Atacand, simvastatin,  aspirin, amlodipine, tramadol.   ALLERGIES:  No known drug allergies.   SOCIAL HISTORY:  He denies any current use of alcohol or tobacco  products.   Family history is noncontributory.   PHYSICAL EXAMINATION:  VITAL SIGNS:  He is afebrile with stable vitals.  GENERAL:  He is an elderly white male in no acute distress.  SKIN:  Warm and dry.  No jaundice.  EYES:  Extraocular muscles are intact.  Pupils equal, round, and  reactive to light.  Sclerae nonicteric.  LUNGS:  Clear bilaterally.  No use of accessory respiratory muscles.  HEART:  There is regular rate and rhythm with an impulse in left chest.  ABDOMEN:  Soft, nontender.  No palpable mass or hepatosplenomegaly.  EXTREMITIES:  No cyanosis, clubbing, or edema.  Good strength in his  arms and legs.  PSYCHOLOGICAL:  He is alert and oriented x3 with no evidence of anxiety  or depression.   On review of his lab work, his white count today was about 15,000, but  it was normal on previous studies.  His liver functions are essentially  normal except for total bilirubin of 2.  His ultrasound was done today,  which showed some stones in the gallbladder and  gallbladder wall  thickening, but this is unchanged pretty much since July.   ASSESSMENT AND PLAN:  This is an 77 year old gentleman with some  significant cardiac history who appears to have some chronic  cholecystitis with cholelithiasis.  At this point though he is pain free  and has tolerated a diet in the last 24 hours, options at this time I  believe would include antibiotics and more time versus percutaneously  draining the gallbladder versus surgery.  The last 2 options would still  require him to be off his Plavix for at least 5-7 days.  At this point  though since he looks so good, we will plan to start him on antibiotics  and see how he does.  If he gets better on antibiotics, then we may be  able to follow him for a period of time and get him farther out from the  stand whether it may be safer to take his Plavix away for a period of  time, and we will continue to follow him closely with you.      Ollen Gross. Vernell Morgans, M.D.  Electronically Signed      PST/MEDQ  D:  04/30/2008  T:  05/01/2008  Job:  [id number]

## 2010-09-16 NOTE — Assessment & Plan Note (Signed)
OFFICE VISIT   Jesus Beck, Jesus Beck  DOB:  12/03/1925                                        June 18, 2009  CHART #:  [id number]   HISTORY:  The patient is an 77 year old gentleman who is approximately  11 months out following a right video-assisted thoracoscopic surgery  with mini thoracotomy for drainage of an empyema and decortication.  Recently, he has noted some occasional discomfort in particular with  coughing or sneezing, but also when lifting or pulling on things, this  discomfort becomes somewhat worse.  It is fairly intermittent in nature.  It is usually quite mild on a scale of mild to severe.  He has had no  significant difficulties with shortness of breath.  Overall, he states  he does pretty much what he wants to do as far as activity without too  much difficulty.   Chest x-ray was obtained on today's date.  It appears to be quite  stable.  There is a chronically elevated right hemidiaphragm, which is  essentially unchanged.  There are no new infiltrates or effusions.   PHYSICAL EXAMINATION:  Vital Signs:  Blood pressure 123/66, pulse 52,  respirations 18, and oxygen saturation is 92% on room air.  General:  A  well-developed elderly male, in no acute distress.  Pulmonary:  Diminished breath sounds in the right base, otherwise clear.  Cardiac:  Regular rate and rhythm.  Incision is well healed.   ASSESSMENT:  Chronic post thoracotomy discomfort.  There are no real  acute changes in this regard.  We will follow him on a p.r.n. basis  should there be any other significant surgical issues that present.   Rowe Clack, P.A.-C.   Sherryll Burger  D:  06/18/2009  T:  06/19/2009  Job:  [id number]

## 2010-09-16 NOTE — Letter (Signed)
August 29, 2008   Jesus Sportsman, MD  [address] [place], [place] [address]   Re:  Jesus Beck, [name]                  DOB:  1926-01-01   Dear Dr. Michaell Cowing:   I saw the patient back today and his blood pressure was 109/54, pulse  54, respirations 18, and saturations were 96%.  Chest x-ray still showed  elevation of the right diaphragm, but no recurrence of his effusion.  He  is doing well overall.  I will see him back again in 6 weeks with a  chest x-ray.   Ines Bloomer, M.D.  Electronically Signed   DPB/MEDQ  D:  08/29/2008  T:  08/30/2008  Job:  [id number]

## 2010-09-16 NOTE — Letter (Signed)
August 01, 2008   [name], MD  [address] [place] [address]   Re:  Jesus Beck, HUTMACHER                  DOB:  05-Dec-1925   Dear Viviann Spare,   I saw the patient back in the office today after we drained his empyema.  His chest x-ray shows just some postoperative changes, but it looks much  better than previously.  He is feeling better.  There is no tenderness.  No abdominal tenderness.  His lungs are clear to auscultation and  percussion.  His blood pressure was 120/61, sats were 93%, pulse 62.  He  is doing well overall.  We will see him back again in 4 weeks with a  chest x-ray.   Sincerely,   Ines Bloomer, M.D.  Electronically Signed   DPB/MEDQ  D:  08/01/2008  T:  08/02/2008  Job:  [id number]   cc:   Deirdre Peer. Polite, M.D.

## 2010-09-16 NOTE — Discharge Summary (Signed)
Beck, Jesus NO.:  [id number]   MEDICAL RECORD NO.:  [id number]          PATIENT TYPE:  INP   LOCATION:  2018                         FACILITY:  [facility]   PHYSICIAN:  Jesus Beck, M.D.    DATE OF BIRTH:  [birth year]   DATE OF ADMISSION:  07/08/2008  DATE OF DISCHARGE:  07/24/2008                               DISCHARGE SUMMARY   CONSULTANTS:  1. Dr. Miles Beck with Interventional Radiology.  2. Dr. Delton Beck with Critical Care/Pulmonary Medicine.  3. Dr. Evelene Beck with Cardiothoracic Surgery.   OPERATING PHYSICIAN THIS ADMISSION:  Dr. Karle Beck with  Cardiothoracic Surgery.   CHIEF COMPLAINT/REASON FOR ADMISSION:  Jesus Beck is an 77 year old male  patient with multiple medical problems, well known to our service from  prior laparoscopic cholecystectomy with postoperative ERCP and known  common bile duct stones.  His surgery was done on May 07, 2008, by  Dr. Michaell Beck.  At that time, he had a negative intraoperative  cholangiogram.  His postoperative course was complicated by a  perihepatic hematoma secondary to gallbladder fossa bed bleeding.  This  did require transfusion.  This delayed the patient going home, but  otherwise he apparently was discharged home without incident.  Prior to  the surgery, the patient had had percutaneous cholecystostomy tube  placed for an acute episode of cholecystitis back in July 2009.  At that  time, he had recently had an MI and was deemed unsafe to remove off  Plavix after having had cardiac stent placed.   The patient represented to the hospital on July 08, 2008, with symptoms  consistent with failure to thrive, abdominal pain, and right pleuritic  chest pain.  The patient had been in the ER several days before  complaining of chest pain and he had actually seen Dr. Nehemiah Beck on July 06, 2008, and had EKG and chest x-ray, and because of this was sent to  the ER by Dr. Nehemiah Beck.  He was evaluated by the Jesus Beck, and was found  to  have atelectasis in the setting of an elevated right hemidiaphragm and  was discharged home to follow up with his primary care physician.  Upon  Dr. Dixon Beck evaluation, the patient was found to be short of breath, had  diminished breath sounds, and was tender in the epigastric and right  upper quadrant regions without guarding or rebounding.  His LFTs were  found to be normal.  His white count was normal at 9.9 and platelets  were 183,000.  Dr. Lindie Beck admitted the patient for observation and a  possible right lower lobe pneumonia.  A CT had been done that showed a  perihepatic fluid collection that could be a biloma given the patient's  abdominal pain.   ADMITTING DIAGNOSES:  1. Abdominal pain and perihepatic fluid collection in a patient post      cholecystectomy with prior postoperative gallbladder fossa      bleeding.  2. Normal LFTs.  3. Questionable mild respiratory failure.  4. Questionable right lower lobe pneumonia on x-ray.  5. Coronary artery disease, asymptomatic on  Plavix.   HOSPITAL COURSE:  The patient was admitted.  He was sent to the step-  down unit.  Within the first 24 hours, the patient had a dramatic  increase in his LFTs, total bilirubin went to 3.5, direct bilirubin 2.2,  indirect bilirubin 1.3, alkaline phosphatase 289, AST 1449, ALT 1100,  lipase 138, amylase 170.  The patient was having significant abdominal  pain.  There was some concern the patient may have either had a retained  common bile duct stone or possibly passed a common bile duct stone.  Because of his pain, his blood pressure was not controlled, and he was  having low-grade fevers.  Additional review of studies showed that on  May 09, 2008, prior to the previous discharge, HIDA scan done at that  time showed no bile leak.   CT was reviewed per myself and Jesus Beck and we opted to have  Interventional Radiology evaluate the patient for possible drain  placement in the perihepatic fluid  collection.  This was done and there  was found to be an old hematoma, drain was left in place.  Over the next  several days, the patient's LFTs dramatically decreased.  His abdominal  pain began to resolve.  A HIDA scan was done that showed no evidence of  biloma, bile leak, or obstructive process.  The patient continued to  have fevers up to 101, continued to have decreased lung sounds in the  right base, although on exam he looked tachypneic, the patient denied  shortness of breath.   Over the next several days, focus was on the patient's pulmonary status.  He had been empirically placed on Avelox upon admission and this had  been continued.  Because of the hematoma, his Plavix had been placed on  hold as well as any empiric Lovenox.  His LFTs continued to trend down,  his white count had normalized, and he continued to have coarse lung  sounds and diminished lung sounds in the right base.  By July 12, 2008,  repeat chest x-ray was done and that demonstrated a very large layering  pleural effusion.  Previous diagnostic studies were reviewed including  prior abdominal CTs, which did capture the base of the lungs, and it  appears the patient had a degree of an effusion back on May 13, 2008, and this appears to have been an evolving process.  This effusion  looked like it would be amenable to thoracentesis procedures, so Dr.  Delton Beck of Critical Care Medicine was consulted, but due to the complexity  of the effusion, given its location, and tracking anteriorly and  laterally with many compartments, it was felt that this would not be  amenable to percutaneous thoracentesis procedure and a CT consult was  recommended.  Dr. Evelene Beck did evaluate the patient on July 13, 2008, and felt that the patient would probably benefit from a VATS  procedure.  It is important to note that over the previous several days,  the patient have been complaining of some right scapular and shoulder   pain.   On July 14, 2008, from the abdominal standpoint, the patient was  stable.  He had no further abdominal pain, was tolerating a regular  diet.  Interventional Radiology discontinued his percutaneous drain.   On July 17, 2008, the patient was taken to the OR by Dr. Laneta Simmers, where  he underwent right video-assisted thoracoscopic surgery.  Mini  thoracotomy drainage of empyema with decortication by Dr. Edwyna Shell.  The  patient was sent back to the Critical Care Unit in immediate  postoperative period with several chest tubes in place, and was  requiring oxygen, but did not require postoperative intubation.   Over the next several days, the patient continued to improve from a  pulmonary status.  By postop day #2, he was able to transfer to the step-  down unit.  His Foley was discontinued on postop day #3.  He had been  placed empirically on vancomycin and continued on Avelox in the  postoperative period.  On postop day #4, his posterior chest tube was  discontinued and over the next several days, the remainder of the chest  tubes were discontinued.  By postoperative day 5, the patient was deemed  appropriate to transfer to the general floor.  He had been up,  ambulating in the room with assistance and by postop day #5, once the  majority of the indwelling lines were discontinued, the patient was able  to ambulate without difficulty.  His white count was 7200, hemoglobin  was 10.4, platelets 279,000.  Sodium was 140, potassium 4.2, CO2 of 29,  glucose 106, BUN 20, creatinine 1.16.  LFTs were normal.  All of his  intraoperative tissue and fluid cultures were negative and showed no  growth.  On date of discharge, his right chest tube sutures were in  place.  Those areas were clean without any drainage.  The mini  thoracotomy incision was approximated and the skin was approximated with  Dermabond and this was clean, dry, and intact without any redness.  The  patient was ambulating without  shortness of breath, shoulder pain, and  was not utilizing any oxygen.  He had an IJ line that was in place and  Dr. Edwyna Shell had ordered that discontinued and okayed the patient for  discharge home on postoperative day #6.   FINAL DISCHARGE DIAGNOSES:  1. Pleuritic chest pain and right shoulder pain secondary to complex      loculated right pleural effusions with associated empyema.  2. Status post video-assisted thoracoscopic surgery procedure with      mini thoracotomy and decortication by Dr. Edwyna Shell on July 17, 2008.  3. Abdominal pain and transaminitis with presumed passage of prior      retained common bile duct stone, now resolved.  4. Resolving perihepatic hematoma from previous laparoscopic      cholecystectomy in January 2010.  5. Chronic acute blood loss anemia exacerbated by recent failure to      thrive and deconditioning, now on iron supplementation.  6. History of coronary artery disease on Plavix, stable this      hospitalization.   DISCHARGE MEDICATIONS:  1. Plavix 75 mg daily.  2. Sotalol 80 mg half tablet b.i.d.  3. Protonix 40 mg daily.  4. Imdur 60 mg daily.  5. Simvastatin 40 mg daily.  6. Atacand 32 mg daily.  7. Amlodipine 5 mg daily.  8. Zantac p.r.n.  9. Nitroglycerin 0.4 mg p.r.n.  10.Vicodin.  He was taking this prior to admission.  This is then      placed on hold.  11.Tramadol 50 mg p.r.n. as previously directed.  Do not take if you      are taking Percocet.   New medications include:  1. Avelox 400 mg daily for the next 2 days.  Complete your course of      treatment.  2. Nu-Iron 150 mg daily.  3. Percocet 5/325 one to two tablets every 4  hours as needed for pain.      Do not take Percocet with your tramadol.   DIET:  Low sodium, heart healthy.   WOUND CARE:  Pat sutures dry, right chest.   ACTIVITY:  Increase activity slowly.  May walk up steps.  May shower.  No driving for 1 week.  No lifting for 4 weeks or as directed by Dr.  Edwyna Shell.    FOLLOWUP APPOINTMENTS:  1. He needs to call Dr. Scheryl Darter office to be seen in 1 week to have      routine followup and removal of chest tube      sutures.  2. You need to call Dr. Michaell Beck' office to be seen in the next 2-3      weeks, so he can follow up with you regarding surgical reasons for      hospitalization, i.e. the passage of presumed retained common bile      duct stone.      Jesus Beck, N.P.      Jesus Beck, M.D.  Electronically Signed    ALE/MEDQ  D:  07/24/2008  T:  07/24/2008  Job:  [id number]   cc:   Ines Bloomer, M.D.  Deirdre Peer. Polite, M.D.  Armanda Magic, M.D.  [name], MD

## 2010-09-16 NOTE — Letter (Signed)
October 10, 2008   [name], MD  [address] [place], [place] [address]   Re:  QUINTEL, MCCALLA                  DOB:  [birth year]   Dear Brett Canales,   I saw the patient back today for follow up of his empyema.  His  incisions are well healed.  His chest x-ray is stable.  Blood pressure  is 155/75, pulse 52, respirations 18, and saturations were 95%.  I will  see him back again.  He is doing well from his empyema status and I do  not need to see him again anymore.  I appreciate the opportunity of  seeing the patient.   Sincerely,   Ines Bloomer, M.D.  Electronically Signed   DPB/MEDQ  D:  10/10/2008  T:  10/11/2008  Job:  [id number]

## 2010-09-16 NOTE — Op Note (Signed)
NAME:  Jesus Beck, Jesus Beck NO.:  [id number]   MEDICAL RECORD NO.:  [id number]          PATIENT TYPE:  INP   LOCATION:  3314                         FACILITY:  [facility]   PHYSICIAN:  Ines Bloomer, M.D. DATE OF BIRTH:  1926/02/16   DATE OF PROCEDURE:  DATE OF DISCHARGE:                               OPERATIVE REPORT   PREOPERATIVE DIAGNOSIS:  Empyema, right chest.   POSTOPERATIVE DIAGNOSIS:  Empyema, right chest.   OPERATION PERFORMED:  Right video-assisted thoracoscopic surgery, mini-  thoracotomy, drainage of empyema with decortication.   SURGEON:  Ines Bloomer, MD   FIRST ASSISTANT:  Doree Fudge, PA-C.   ANESTHESIA:  General anesthesia.   DESCRIPTION OF PROCEDURE:  After percutaneous insertion of all  monitoring lines, the patient underwent general anesthesia, turned to  the thoracotomy position.  The patient had a previous cholecystectomy  for chronic cholelithiasis and had percutaneous drains inserted and then  developed a right empyema.  Two trocar sites were made in the anterior  and posterior axillary line at the seventh intercostal space, the left  lung was deflated with a dual-lumen tube.  Near in there space there was  evidence of marked inflammatory process, infection secondary __________  that you would see.  This was dissected out and then the exudate was  sent for culture and using Kaiser ring forceps, the lung was freed up.  The upper lobe and middle lobe were freed up and then the exudate and  the inflamed  __________ was taken off the pleura, medially and  laterally and down into the costophrenic angle.  However, we could not  decorticate the lower lobe because of his thick pleura, so we did a 5cm  to 6 cm incision over the 7th and 6th intercostal space and placed a  small __________ retractor in this space after dividing the muscle.  We  then did a decortication of the lower lobe, stripping the thickened peel  off with sharp and  blunt dissection using Kitner's as well as freeing up  the major fissure and freeing all the debris and exudate out the major  fissure.  When the right lower lobe was completely free, we then did a  partial decortication of the posterior segment of the upper lobe and of  the middle lobe until they were completely freed also.  It was a very  tedious procedure, but after it was completed, we then placed 3 chest  tubes, 2 to the trocar sides in the anterior and posterior straight  chest tube and into an incision in between the 2, a right angle chest  tube in the costophrenic angle.  The chest was closed with 3  pericostal's drilling through the 7th rib and passed around the 6th rib,  #1 Vicryl in the muscle layer, 2-0 Vicryl in subcutaneous tissue, and  Dermabond for the skin.  The patient was returned to the recovery room  in stable condition.      Ines Bloomer, M.D.  Electronically Signed    DPB/MEDQ  D:  07/19/2008  T:  07/20/2008  Job:  [id number] 

## 2010-09-16 NOTE — H&P (Signed)
NAME:  Jesus Beck, Jesus Beck                  ACCOUNT NO.:  [id number]   MEDICAL RECORD NO.:  [id number]          PATIENT TYPE:  INP   LOCATION:  4733                         FACILITY:  [facility]   PHYSICIAN:  Corinna L. Lendell Caprice, MDDATE OF BIRTH:  13-Jan-1926   DATE OF ADMISSION:  11/24/2007  DATE OF DISCHARGE:                              HISTORY & PHYSICAL   CHIEF COMPLAINT:  Fevers and chills.   HISTORY OF PRESENT ILLNESS:  Jesus Beck is a pleasant 77 year old white  male with a history of heart disease, paroxysmal atrial fibrillation,  and cholelithiasis who presents to the emergency room with the above  complaints.  He has been having fevers and chills for about 3 or 4 days  now.  He has had intermittent vomiting and intermittent right upper  quadrant pain.  He has had no hematemesis.  He has had no diarrhea.  He  also feels some joint pain, specifically his hips, his right wrist, and  his right shoulder, which he attributed to doing yard work.  He had an  ultrasound of the abdomen on November 07, 2007, which showed cholelithiasis,  but no evidence of cholecystitis or biliary obstruction.  He has had no  cough.  He has had some dysuria for the past several weeks.  He feels  short of breath when he has chills.  He was found to have a  leukocytosis.  No urinalysis has been done.  He also had an episode of  SVT into 150s which resolved with IV Cardizem 10 mg.  He was  asymptomatic with this episode.  He was also found to have an elevated  troponin and Jesus Beck was called, but deferred admission to the  patient's medical doctor which is Jesus Beck.   PAST MEDICAL HISTORY:  1. Coronary artery disease with history of coronary artery bypass      graft and drug-eluting stent on October 19, 2007, to the mid LAD.      Cardiac catheterization at that time showed good ejection fraction.  2. Paroxysmal atrial fibrillation.  3. Cholelithiasis.  4. Hiatal hernia and gastroesophageal reflux based on upper GI  from      November 07, 2007.  5. Chronic right bundle-branch block.  6. Hypertension.  7. Hyperlipidemia.   MEDICATIONS:  1. Tramadol.  2. Imdur 30 mg a day.  3. Plavix 75 mg a day.  4. Aspirin 325 mg a day.  5. Atacand 32 mg a day.  6. Propafenone 150 mg a day.  7. Simvastatin 40 mg a day.  8. Zantac 75 mg a day.  9. Tylenol as needed.   SOCIAL HISTORY:  The patient quit smoking about 6 years ago.  He drinks  occasionally.   FAMILY HISTORY:  His mother had heart disease.  He has several siblings  with heart disease.   PAST SURGICAL HISTORY:  Coronary artery bypass graft.   REVIEW OF SYSTEMS:  As above, otherwise negative.   PHYSICAL EXAMINATION:  VITAL SIGNS:  Temperature was 99.3, blood  pressure 108/64, pulse 86, and while I was examining the patient  increased to 150 and now is down into the 70s after Cardizem,  respiratory rate 18, and oxygen saturation 96% on room air.  GENERAL:  The patient is well nourished, well developed with occasional  rigors.  HEENT:  Normocephalic, atraumatic.  Pupils equal, round, and reactive to  light.  He has dry mucous membranes.  NECK:  Supple.  No JVD.  No lymphadenopathy.  LUNGS:  Clear to auscultation bilaterally without wheezes, rhonchi, or  rales.  CARDIOVASCULAR:  Regular rate and rhythm without murmurs, gallops, or  rubs.  ABDOMEN:  Normal bowel sounds, soft, nontender, and nondistended.  GU/RECTAL:  Deferred.  EXTREMITIES:  No clubbing, cyanosis, or edema.  He has some healing  abrasions over his pretibial areas.  Pulses are intact.  NEUROLOGIC:  Alert and oriented.  Cranial nerves and sensorimotor exam  are intact.  PSYCHIATRIC:  The patient is calm and cooperative with normal affect.  SKIN:  No rash.   LABORATORY DATA:  White blood cell count is 18,000 with 91% neutrophils,  3% lymphocytes, hemoglobin 13.6, hematocrit 40.2, and platelet count  120.  On last CBC, his platelet count was 147.  Sodium 135, potassium  5.4, and  his potassium was 4.2 on October 20, 2007, chloride 102,  bicarbonate 27, glucose 129, BUN 32, creatinine 1.99, and on October 20, 2007 his creatinine was 1.37, total bilirubin is 2.7, alkaline  phosphatase 83, SGOT 125, SGPT 81, total protein 6.4, albumin 3.5.  CPK  total is 1854, CPK-MB is 6.2, troponin is 0.97, relative index is 0.3,  myoglobin greater than 500, lipase 15.  EKG initially showed normal  sinus rhythm with first-degree AV block and right bundle-branch block  which is old.  When he had tachycardia, EKG showed SVT with strain.  Chest x-ray shows no active lung disease, stable chest x-ray.   ASSESSMENT/PLAN:  1. Fevers, chills in the setting of vomiting, leukocytosis, and      occasional right upper quadrant pain.  He did have cholelithiasis      earlier this month.  I will check ultrasound of the gallbladder to      check for cholecystitis or common duct stone.  Although his      alkaline phosphatase is normal, he may need HIDA scan.  Also, check      a urinalysis.  He has received a dose of vancomycin and Zosyn here.      I will continue empiric Unasyn for now.  After his ultrasound,      start clear liquids.  2. Coronary artery disease with recent stent and preserved ejection      fraction and history of coronary artery bypass graft.  The patient      had an elevated CPK as well as troponin.  This may be related to      intermittent rapid atrial fibrillation versus rhabdomyolysis.  I      will check another set of cardiac enzymes and hold his statin for      now.  He has endorsed some pain albeit arthralgias not myalgias.  I      have also consulted Jesus Beck Beck, apparently someone was called      last night by the ED physician.  3. Acute renal insufficiency secondary to prerenal azotemia.  4. Tachycardia, suspect rapid atrial fibrillation versus atrial      flutter.  The patient has converted back to sinus rhythm after IV      Cardizem.  He will be placed  on  telemetry.  5. Hyperkalemia.  Hold his Atacand and give IV fluids.  Repeat this      now and also check magnesium and phosphorus.  6. Hypertension.  7. Hyperlipidemia.  8. Chronic right bundle-branch block.   In addition to above, I will check a TSH and a phosphorus and repeat his  EKG.      Corinna L. Lendell Caprice, MD  Electronically Signed     CLS/MEDQ  D:  11/25/2007  T:  11/25/2007  Job:  [id number]   cc:   Armanda Magic, M.D.

## 2010-09-16 NOTE — Consult Note (Signed)
NAMEMALIKI, GIGNAC NO.:  [id number]   MEDICAL RECORD NO.:  [id number]           PATIENT TYPE:   LOCATION:                                 FACILITY:   PHYSICIAN:  Jake Bathe, MD      DATE OF BIRTH:  11-26-1925   DATE OF CONSULTATION:  11/25/2007  DATE OF DISCHARGE:                                 CONSULTATION   REQUESTING PHYSICIAN:  Corinna L. Lendell Caprice, MD, [facility] hospitalist.   PRIMARY CARDIOLOGIST:  Armanda Magic, MD   REASON FOR CONSULTATION:  Mr. Witherspoon is being seen at the request of Dr.  Lendell Caprice for the evaluation of supraventricular tachycardia as well as  elevated troponin.   HISTORY OF PRESENT ILLNESS:  Mr. Schubert is a 77 year old male with  coronary artery disease, status post bypass with recent drug-eluting  stent placement to distal LAD, past the LIMA insertion point on October 19, 2007, with chronic paroxysmal atrial fibrillation/SVT on propafenone who  last night was admitted secondary to nausea, vomiting, diaphoresis, and  right upper quadrant discomfort.  According to him, he is about to  undergo more complete GI evaluation for esophageal condition.  He, for  the past 2 days, has been having the heavy shakes and could not eat.  He has missed approximately 2-3 doses of his propafenone or 2-3 days.  He denies any chest pain.  He has had chills as described above and  fevers.   Here in the [facility], he was found to have a  white count of 18.9 and slightly elevated LFTs with normal lipase.  During the middle of the night, he began to experience SVT which was  caught on EKG at a ventricular rate of 155 beats per minute, regular,  and consistent with possible atrial tachycardia versus reentrant  circuit.  There is a noticeable retrograde P-wave just following the QRS  complex.  Note, he has had prior history of SVT and is on propafenone.  He also had complained of some shortness of breath.  He stated that he  did some yard  work the other day and feels like some of this right upper  quadrant pain may be associated with heavy exertional activity or muscle  pull.   PAST MEDICAL HISTORY:  1. Coronary artery disease - coronary artery bypass graft - recent      drug-eluting stent to LAD after LIMA insertion point.  2. Hypertension.  3. Dyslipidemia.  4. Paroxysmal atrial fibrillation/SVT on propafenone  5. Chronic right bundle-branch block.   MEDICATIONS:  At home, he is taking:  1. Plavix 75 mg a day.  2. Imdur 30 mg a day.  3. Aspirin 325 mg a day.  4. Atacand 32 mg a day.  5. Propafenone 150 mg once a day.  6. Simvastatin 40 mg a day.  7. Zantac 75 mg a day.  8. Tylenol p.r.n.   FAMILY HISTORY:  Currently noncontributory.   SOCIAL HISTORY:  Denies any tobacco, alcohol, or illicit drug use.   REVIEW OF SYSTEMS:  Unless described above, all  other review of systems  negative.  He does complain of occasional backache and joint pain.   PHYSICAL EXAMINATION:  VITAL SIGNS:  Blood pressure 152/69, heart rate  currently sinus rhythm rate 94, sating 92% on room air with a  respiration rate of 16, and temperature on arrival was 99.3.  GENERAL:  He is alert and oriented x3, in no acute distress, resting  comfortably in his bed, and pleasant.  During the episode of paroxysmal  atrial tachycardia/SVT, he was in no distress.  EYES:  Well-perfused conjunctivae.  No scleral icterus.  NECK:  No JVD.  No carotid bruits, supple.  CARDIOVASCULAR:  Regular rate and rhythm with no appreciable murmurs,  rubs, or gallops.  Normal PMI.  LUNGS:  Clear to auscultation bilaterally.  Normal respiratory effort.  ABDOMEN:  Perhaps mild tenderness in the right upper quadrant area,  otherwise normal.  I do not appreciate any hepatosplenomegaly.  Normal  bowel sounds.  EXTREMITIES:  No clubbing, cyanosis, or edema.  Normal distal pulses.  NEUROLOGIC:  Nonfocal.  Unable to assess gait.  No tremors.  SKIN:  Warm, dry, and intact.   No rashes.   LABS:  First set of biomarkers demonstrated a myoglobin of greater than  500, CK-MB of 3.5, and troponin of 0.22.  Second set then demonstrated a  CK of 18.54, MB of 6.2, and troponin of 0.97.  Lipase was normal.  Sodium 135; potassium 5.4, slightly elevated; BUN 32; creatinine 1.99;  AST 125; ALT 81; total bilirubin 2.7; and calcium 9.5.  White blood cell  count 18.9 with neutrophil percentage 91%, hemoglobin 13.6, hematocrit  40.2, and platelet count 120, slightly low.   Chest x-ray, personally viewed showed no acute airspace disease, prior  sternotomy wires noted, and slightly prominent pulmonary arteries.  Upper GI series on November 07, 2007, showed a minimal hiatal hernia marked  reflux and postbulbar focal narrowing.  CT of abdomen currently pending.   ASSESSMENT AND PLAN:  A 77 year old male with coronary artery disease,  status post bypass with recent drug-eluting stent placement admitted  with leukocytosis, fevers, chills, right upper quadrant discomfort with  mildly elevated bilirubin and liver enzymes concerning for possible  cholecystitis with supraventricular tachycardia noted on ECG as well as  positive troponin.  1. Coronary artery disease - currently stable.  Certainly with recent      drug-eluting stent placement, he would require both Plavix and      aspirin.  Of course, if surgical procedure is required, this may      need to be stopped.  If this is a necessity, he does have bypass      grafting for protection; however, we will have to weigh risks      versus benefit.  For now, continue both Plavix and aspirin as Dr.      Lendell Caprice has prescribed.  Currently, describing no angina.  2. Positive troponin - CK is quite elevated and troponin is also      mildly elevated.  CK and MB ratio is currently reassuring.  Mild      elevation in troponin likely secondary to current illness/abdominal      process.  Certainly, with his mildly elevated creatinine and  recent      intervention, he may still have residual troponin elevation from      that prior procedure.  This is not likely acute coronary syndrome      currently.  We will continue Plavix and aspirin.  3.  Paroxysmal atrial tachycardia/atrial fibrillation/supraventricular      tachycardia - supraventricular tachycardia noted on ECG with clear      retrograde P wave.  May be reentrant tachycardia.  He does have a      history, however, of paroxysmal atrial fibrillation.  He has missed      a few doses of his propafenone.  We will re-prescribe.  Hopefully,      this will help alleviate tachycardia.  Also with a small dose of      diltiazem, I believe 10 mg, he quickly converted back to sinus      rhythm.  4. Hyperlipidemia - we will currently hold statin given LFTs.  5. Right upper quadrant pain/fever/leukocytosis - currently managed by      Dr. Lendell Caprice.  He is on Unasyn and is awaiting right upper quadrant      ultrasound.  6. We will continue to cycle cardiac enzymes and monitor for any signs      of extreme elevation.  We will also obtain an EKG in the morning.      We will continue to follow along with you.   Thank you for this consult.      Jake Bathe, MD  Electronically Signed     MCS/MEDQ  D:  11/25/2007  T:  11/25/2007  Job:  [id number]   cc:   Corinna L. Lendell Caprice, MD

## 2010-09-16 NOTE — Discharge Summary (Signed)
NAME:  Jesus Beck, Jesus Beck NO.:  [id number]   MEDICAL RECORD NO.:  [id number]          PATIENT TYPE:  OIB   LOCATION:  6524                         FACILITY:  [facility]   PHYSICIAN:  Guy Franco, P.A.       DATE OF BIRTH:  February 11, 1926   DATE OF ADMISSION:  10/19/2007  DATE OF DISCHARGE:  10/20/2007                               DISCHARGE SUMMARY   ADDENDUM   His discharge medication was subinguinal nitroglycerin p.r.n. chest  pain.  I reviewed how to lower this medication and how to take it if  needed for chest pain.  Also, the patient will participate in cardiac  rehab phase I and phase II as recommended.      Guy Franco, P.A.     LB/MEDQ  D:  10/20/2007  T:  10/20/2007  Job:  [id number]

## 2010-09-16 NOTE — Cardiovascular Report (Signed)
NAME:  Jesus Beck, Jesus Beck                  ACCOUNT NO.:  [id number]   MEDICAL RECORD NO.:  [id number]          PATIENT TYPE:  OIB   LOCATION:  6524                         FACILITY:  [facility]   PHYSICIAN:  Armanda Magic, M.D.     DATE OF BIRTH:  Jan 07, 1926   DATE OF PROCEDURE:  10/19/2007  DATE OF DISCHARGE:                            CARDIAC CATHETERIZATION   PROCEDURES:  1. Left heart catheterization.  2. Coronary angiography.  3. Left ventriculography.  4. LIMA angiography.  5. Saphenous vein graft angiography.   OPERATOR:  Armanda Magic, M.D.   INDICATIONS:  Chest pain.   COMPLICATIONS:  None.   IV access via right femoral artery 6-French sheath.   This is a 77 year old male who has a history of coronary disease with a  30% left main ostial stenosis and ramus intermedius with a 50% distal  lesion in the lower branch.  Circumflex was normal, right coronary  artery with 70-80% proximal narrowing and then a 90% midvessel lesion.  The patient underwent coronary artery bypass grafting with a LIMA to the  LAD, saphenous vein graft to the diagonal, and saphenous vein graft to  the RCA.  He has been having exertional chest pain and now presents for  cardiac catheterization.  The patient now presents for heart  catheterization.   The patient was brought to the Cardiac Catheterization Laboratory in a  fasting, nonsedated state.  Informed consent was obtained.  The patient  was connected to continuous heart rate, pulse oximetry monitoring, and  his blood pressure monitoring.  The right groin was prepped and draped  in sterile fashion.  Xylocaine 1% was used for local anesthesia.  Using  modified Seldinger technique, a 6-French sheath was placed in right  femoral artery.  Under fluoroscopic guidance, a 6-French JL5 catheter  was placed in the left coronary artery.  Multiple cine films were taken  at 30 degree RAO, 40 degree LAO views.  His catheter was then exchanged  out over a guidewire for  6-French JR4 catheter which was placed under  fluoroscopic guidance in the saphenous vein graft to the RCA.  Multiple  cine films were taken at 30-degree RAO, 40-degree LAO views.  This  catheter was then advanced into the saphenous vein graft to the diagonal  and multiple cine films were taken at 30-degree RAO, 40-degree LAO  views.  This catheter was then placed in the right coronary artery.  Multiple cine films were taken at 30-degree RAO, 40-degree LAO views.  The catheter was then pulled back into the aortic arch and attempts at  advancing into the left subclavian artery were somewhat difficult in  that the artery was very torturous.  Flush cine films were taken of the  LIMA graft which showed that it was widely patent proximally.  The  catheter was then exchanged out of a guidewire for 6-French angled  pigtail catheter which was placed under fluoroscopic guidance in the  left ventricular cavity.  Left ventriculography was performed in the 30-  degree RAO view using total of 30 cc of contrast at 15 cc per  second.  Catheter was then pulled back across the aortic valve with no  significant gradient noted.  At the end of procedure, the patient went  on to PCI of the native LAD.   RESULTS:  The left main shows an ostial stenosis approximately 50%.  It  then trifurcates in the left anterior descending artery, left circumflex  artery, and ramus branch.  The left anterior descending artery has an  80% proximal stenosis and then gives rise to a first diagonal which has  a 95% ostial stenosis.  There is evidence of competitive flow into a  saphenous vein graft to the diagonal mid way down the vessel.  In the  ongoing LAD, there is evidence of a LIMA graft insertion into the mid  LAD.  Just distal to that is the takeoff of the second diagonal branch  which has an ostial 70% narrowing and then bifurcates into two daughter  vessels.  This vessel is very small in caliber.  Just distal to the   takeoff of the second diagonal, there is an 80% eccentric stenosis of  the mid LAD.  The ongoing LAD is widely patent.   The ramus branch has an 80% ostial stenosis proximally and bifurcates  into two daughter vessels.   Left circumflex is widely patent throughout its course in the AV groove.   The right coronary artery is occluded proximally.   The saphenous vein graft to the RCA is widely patent with a widely  patent distal RCA, posterior descending, and posterior lateral vessels.   The saphenous vein graft to the diagonal 1 is widely patent.   The LIMA to the LAD proximally appears patent by flush shots.  We could  not engage it because of the tortuosity of the left subclavian artery.  There was evidence of excellent competitive flow through the native LAD  into the LIMA graft, and we were able to track the LIMA graft almost up  to its origin off the left subclavian artery and appeared widely patent.   Left ventriculography shows normal LV function, EF 60%, left ventricular  pressure 158/9 mmHg, aortic pressure 161/64 mmHg, LVEDP 15 mmHg.   ASSESSMENT:  1. Severe 3-vessel coronary disease with patent left internal mammary      artery to the left anterior descending (coronary artery), patent      saphenous vein graft to the diagonal 1, patent saphenous vein graft      the right coronary artery, 80% stenosis of the left anterior      descending (coronary artery) distal to left internal mammary artery      insertion in the left anterior descending (coronary artery).  The      ramus had an 80% stenosis, was a fairly small vessel.  There was      also a second diagonal with a 70% stenosis proximally, again too      small for percutaneous coronary intervention.  2. Normal left ventricular function.  3. Exertional angina with anterior ischemia, on Cardiolite.   PLAN:  PCI of the mid native LAD, on aspirin and Plavix.  We will hold  his metoprolol since he is bradycardic and continue  his other  medications.      Armanda Magic, M.D.  Electronically Signed    TT/MEDQ  D:  10/19/2007  T:  10/20/2007  Job:  [id number]

## 2010-09-16 NOTE — Cardiovascular Report (Signed)
NAMECALIEB, LICHTMAN NO.:  [id number]   MEDICAL RECORD NO.:  [id number]          PATIENT TYPE:  OIB   LOCATION:  6524                         FACILITY:  [facility]   PHYSICIAN:  Corky Crafts, MDDATE OF BIRTH:  04/27/1926   DATE OF PROCEDURE:  10/19/2007  DATE OF DISCHARGE:                            CARDIAC CATHETERIZATION   REFERRING PHYSICIAN:  Armanda Magic, MD   PROCEDURE PERFORMED:  PCI of the LAD.   PROCEDURE NARRATIVE:  The diagnostic catheterization was performed by  Dr. Mayford Knife.  This revealed a 70% LAD lesion past the insertion of the  LIMA in the mid LAD.  Angiomax was used for anticoagulation.  A CLS-4  guiding catheter was placed into the ostium of the vessel.  Prowater  wire was placed across the lesion.  A 2.0 x 12 Apex balloon was used to  predilate the lesion.  It was deployed at 10 atmospheres for 27 seconds.  Subsequently, a 2.25 x 16 mm Taxus Atom stent was then placed across the  lesion and deployed at 10 atmospheres for 40 seconds.  The procedure was  complicated because it was difficult to get opacification of the mid  LAD.  There was an ostial left main stenosis and there was also  competitive flow from the LIMA which was washing out dye in the area of  interest.  There was an excellent angiographic result.  No residual  stenosis.  The patient tolerated the procedure well.   IMPRESSION:  1. Successful drug-eluting stent to the mid-left anterior descending      past the insertion of the left internal mammary artery.  This was a      2.25 x 16 mm Taxus Atom stent.  2. We will continue aspirin and Plavix for minimum of 12 months along      with other secondary prevention.  He will be watched overnight and      assuming no complications is discharged tomorrow.      Corky Crafts, MD  Electronically Signed     JSV/MEDQ  D:  10/19/2007  T:  10/20/2007  Job:  [id number]   cc:   Deirdre Peer. Polite, M.D.

## 2010-09-16 NOTE — Consult Note (Signed)
NAMEHERSHY, FLENNER NO.:  [id number]   MEDICAL RECORD NO.:  [id number]          PATIENT TYPE:  INP   LOCATION:  4733                         FACILITY:  [facility]   PHYSICIAN:  Sharlet Salina T. Hoxworth, M.D.DATE OF BIRTH:  Sep 20, 1925   DATE OF CONSULTATION:  11/26/2007  DATE OF DISCHARGE:                                 CONSULTATION   CHIEF COMPLAINT:  Right upper quadrant pain, fever, chills, and  shortness of breath.   HISTORY OF PRESENT ILLNESS:  Mr. Toft is a very pleasant 77 year old  male.  I was asked by the [facility] to evaluate him.  He  was admitted late in the evening on November 24, 2007.  He states that the  day prior to this, he was having increasing difficulty with shortness of  breath, fever, shaking chills, and right upper quadrant discomfort.  He  has at least a several week history of some intermittent right upper  quadrant abdominal pain.  He was actually evaluated in early July for  some abdominal discomfort with a gallbladder ultrasound showing a single  gallstone in a noninflamed gallbladder.  He also had an upper GI series  showing some mild narrowing of the duodenum and upper endoscopy was  recommended and is planned.  However, he became more acutely ill on November 24, 2007, as described and was admitted through the emergency room.  Since admission, the patient has been on intravenous antibiotics and is  feeling definitely better with resolution of his fever, chills, and  shortness of breath, although he continues to have some right upper  quadrant discomfort.  This is not severe pain but catches him  when he  takes a deep breath or moves certain ways.   The patient also was found to be in SVT on admission.  He has a history  of paroxysmal atrial fibrillation.  Cardiac workup showed an elevated  troponin.  The patient has a significant recent cardiac history of a  drug-eluting stent placed in the LAD on October 21, 2007, and has been  on  aspirin and Plavix.  He has been evaluated at this hospitalization by  Cardiology.  He is back in normal sinus rhythm.  It appears from the  consultation that his elevated troponin is not felt secondary to acute  ischemia, but this is not completely clear to me on reviewing the  consult written note.  It is felt that the patient needs to remain on  Plavix and aspirin due to his recent stent unless there is an emergency  need for surgery.  The patient has developed some moderately elevated  LFTs, also as described below.  Again, the patient currently feels  better with just some mild persistent right upper quadrant discomfort.  No nausea or vomiting.   PAST MEDICAL HISTORY:  Coronary artery disease, status post coronary  artery bypass graft at [facility] in the 1990s and then status post  drug-eluting stent to the LAD on October 19, 2007.  He is followed for  hypertension, elevated lipids, and GERD.  History of PAF and  SVT as  above.  He has a history of peripheral vascular disease and is status  post bilateral carotid endarterectomy.   MEDICATIONS:  1. Aspirin 324 mg daily.  2. Nitropaste.  3. Ultram p.r.n.  4. Isosorbide 30 mg daily.  5. Diltiazem drip.  6. Protonix 40 mg IV daily.  7. Plavix 75 mg p.o. daily.  8. Rythmol 150 mg p.o. daily.  9. Subcu heparin.  10.IV vancomycin.  11.Unasyn.   ALLERGIES:  No known drug allergies.   Regular medications prior to admission were:  1. Metoprolol 12.5 mg daily.  2. Atacand 60 mg daily.  3. Tramadol p.r.n.  4. Isosorbide 30 mg daily.  5. Plavix 75 daily.  6. Propafenone 150 mg daily.  7. Simvastatin 40 daily.  8. Aspirin 81 mg daily.  9. Zantac 75 p.r.n.   SOCIAL HISTORY:  Lives at home with his son.  Does not smoke cigarettes  and drinks rare alcohol.   FAMILY HISTORY:  Noncontributory.   REVIEW OF SYSTEMS:  GENERAL:  Positive for fever, chills, and malaise  with this illness.  RESPIRATORY:  Positive shortness of  breath with this  illness on presentation, none currently.  CARDIAC:  Denies chest pain.  ABDOMEN:  GI as above.  GU:  No urinary burning or frequency.   PHYSICAL EXAMINATION:  VITAL SIGNS:  Temperature is 99.4, heart rate 96,  respirations 20, and blood pressure 180/87.  GENERAL:  He is a thin, alert, elderly white male in no acute distress.  SKIN:  Warm and dry.  No rash or infection.  HEENT:  Sclerae nonicteric.  NECK:  No palpable mass or thyromegaly.  LYMPH NODES:  No cervical, supraclavicular, or inguinal nodes palpable.  LUNGS:  No increased work of breathing.  Breath sounds clear and equal.  CARDIAC:  Well-healed sternotomy.  Regular rate and rhythm.  No edema.  No carotid bruits.  Healed incisions.  ABDOMEN:  Nondistended.  Soft.  There is a well localized mild-to-  moderate right upper quadrant tenderness with slight guarding.  No  palpable masses or hepatosplenomegaly.  EXTREMITIES:  Thin.  No joint swelling or deformity.  NEUROLOGIC:  Alert and oriented.  Motor and sensory exams grossly  normal.   LABORATORY:  CBC on admission showed [place] 95[address], hemoglobin  of 13.6, platelets of 120 and white count today is decreased to 12.5 and  hemoglobin is 13.  LFTs showed a bilirubin of 2.7 on admission, today  2.6, alkaline phosphatase 83 on admission, 156 today, SGOT and PT 125  and 81 on admission, 173 and 127 today.  Lipase normal at 15.  Blood  cultures, no growth to date.  Cardiac markers showed an elevated  troponin 0.22 on admission, 0.97 on November 25, 2007, and 0.57 later on the  November 25, 2007.  CK-MB and index normal.   IMAGING:  Chest x-ray negative.  Ultrasound of the abdomen now shows  moderate thickening of the gallbladder wall and sludge in the  gallbladder.  Common bile duct could not be visualized due to the gas.  Pancreas appeared normal, previously noted gallstone, not visualized.   ASSESSMENT AND PLAN:  A 77 year old male with multiple medical  problems  and recent coronary artery stent as described above on aspirin and  Plavix.  He also had recent SVT and elevated troponin of questionable  significance.  He certainly has evidence of acute cholecystitis.  He  clinically appears improved on IV antibiotics.  He has moderately  elevated LFTs.  At this point, I would recommend following his LFTs  closely.  If they continue to increase, I would ask Gastroenterology to  evaluate the patient regarding a possible ERCP for suspected common bile  duct stone.  If his LFTs stabilize or decrease, then options would  include urgent laparoscopic cholecystectomy versus percutaneous drainage  of the gallbladder versus treatment with antibiotics and delayed  cholecystectomy.  If the patient is felt to be at higher risk by  Cardiology or if he cannot temporarily come off his Plavix and aspirin  temporarily for surgery, then I would favor percutaneous drainage or  antibiotic treatment and delayed cholecystectomy when his cardiac status  is more stable.  We will follow with you.      Lorne Skeens. Hoxworth, M.D.  Electronically Signed     BTH/MEDQ  D:  11/26/2007  T:  11/26/2007  Job:  [id number]

## 2010-09-16 NOTE — Discharge Summary (Signed)
NAMEALF, DOYLE                  ACCOUNT NO.:  [id number]   MEDICAL RECORD NO.:  [id number]          PATIENT TYPE:  OIB   LOCATION:  6524                         FACILITY:  [facility]   PHYSICIAN:  Armanda Magic, M.D.     DATE OF BIRTH:  02/25/1926   DATE OF ADMISSION:  10/19/2007  DATE OF DISCHARGE:  10/20/2007                               DISCHARGE SUMMARY   DISCHARGE DIAGNOSES:  1. Coronary artery disease and history of known coronary artery bypass      grafting now with disease to the left anterior descending, status      post drug-eluting stent to that region.  2. Hypertension, good control.  3. Dyslipidemia, treated.  4. Paroxysmal atrial fibrillation, on propafenone.  5. Chronic right bundle branch block.  6. Long-term medication use.   HOSPITAL COURSE:  Jesus Beck is a 77 year old male patient with a known  history of coronary artery disease.  He had bypass [place] [facility] [place]  He at that time  had a catheterization done that showed significant left main disease.  He had a LIMA graft to the LAD implanted.  There was also a saphenous  vein graft to the diagonal and saphenous vein graft to the right  coronary artery performed at that time.   Now, he is complaining of some occasional chest discomfort without doing  yard work.  He does not get any shortness of breath with the work.  He  states the pain will gradually go away on its own after about 10 minutes  of rest.  He states that his symptoms are similar to those clearly prior  to his acute heart surgery.  A stress test was ordered and it did reveal  evidence of mild-to-moderate ischemia in the mid anterior region.  The  test was also associated with the patient's typical chest discomfort.  For these reasons, it was recommended that he undergo cardiac  catheterization and the patient was in full agreement.   The cardiac catheterization was scheduled for October 19, 2007.  The  patient was found to have diffuse disease in the native LAD system.  There was 60% left main and proximal 80% LAD lesion and distal 80% LAD  lesion.  All diagonals had ostial lesions:  D1 80%, D2 95%, and D3 70%  stenosis.  The LIMA to the LAD was implanted in between the second and  third diagonals.  The right coronary artery was clean.  The saphenous  vein graft to the right coronary artery was occluded.  The saphenous  vein graft to the first diagonal was patent as well as LIMA to the LAD.   He had 80% stenosis of the LAD distal to the LIMA.  Insertion site was  felt to be discomfort.  Dr. Allyson Sabal was then scrubbed in and deployed a  drug-eluting stent to that area without difficulty.   The patient then remained in the hospital overnight.  Lab studies showed  hemoglobin of 14.5, hematocrit 41.5, platelets 147, white count 6.9.  BUN 7, creatinine  1.37, sodium 141, potassium 4.2, glucose 98.  Please  note the patient had some bradycardia during the night with heart rates  down to 48.  He has a history of PAF and the Rythmol dose that he was on  was 150 mg twice a day and this decreased as listed below.   DISCHARGE MEDICATIONS:  1. Imdur 30 mg a day.  2. Plavix 75 mg a day.  3. Enteric-coated aspirin 325 mg a day.  4. Atacand 32 mg daily.  5. Tramadol p.r.n.  6. Propafenone 150 mg once a day (new dose).  7. Simvastatin 40 mg a day.  8. Zantac 75 mg a day.  9. Tylenol if needed.   DISCHARGE INSTRUCTIONS:  The patient is to remain on a low-sodium heart-  healthy diet.  Increase activity slowly.  No lifting over 10 pounds for  1 week and no driving for 2 days.  Clean cath site gently with soap and  water.  No scrubbing.  Follow up with Dr. Armanda Magic on October 31, 2007  at 2:45 p.m.      Guy Franco, P.A.      Armanda Magic, M.D.  Electronically Signed    LB/MEDQ  D:  10/20/2007  T:  10/20/2007  Job:  [id number]   cc:   Deirdre Peer. Polite, M.D.

## 2010-09-16 NOTE — Op Note (Signed)
NAME:  Jesus Beck, Jesus Beck NO.:  [id number]   MEDICAL RECORD NO.:  [id number]          PATIENT TYPE:  AMB   LOCATION:  DAY                          FACILITY:  [facility]   PHYSICIAN:  Jesus Sportsman, MD     DATE OF BIRTH:  09/29/1925   DATE OF PROCEDURE:  DATE OF DISCHARGE:                               OPERATIVE REPORT   PRIMARY CARE PHYSICIAN:  Armanda Magic, MD and Deirdre Peer. Polite, MD   CARDIOLOGIST:  Lyn Records, MD   SURGEON:  Jesus Sportsman, MD   ASSISTANT:  Kelle Darting. Rennis Harding, N.P..   PREOPERATIVE DIAGNOSIS:  Recurrent acute cholecystitis.   POSTOPERATIVE DIAGNOSIS:  Recurrent acute cholecystitis.   PROCEDURE PERFORMED:  1. Laparoscopic lysis of adhesions x60 minutes.  2. Laparoscopic cholecystectomy with intraoperative cholangiogram.   SPECIMEN:  Gallbladder.   DRAINS:  None.   ESTIMATED BLOOD LOSS:  100 mL.   COMPLICATIONS:  No major complications.   INDICATIONS:  [name] is an 77 year old gentleman who had acute  cholecystitis.  He has coronary disease.  At the time of his gallbladder  attack, he had percutaneous drainage of his gallbladder since they could  not hold anticoagulation for a stent.  Unfortunately it reoccurred with  cholecystitis and was readmitted last week.  His Plavix was held.  He  was transitioned over to Lovenox followed by Cardiology, and it was felt  that he would benefit from cholecystectomy during this admission.  His  white count normalized and his pain decreased.   Anatomy and physiology of hepatobiliary function and pancreatic function  was discussed.  Pathophysiology of cholecystitis was discussed.  Options  were discussed and recommendation was made for laparoscopic possible  open cholecystectomy with intraoperative cholangiogram.  Risks,  benefits, and alternatives were discussed.  Questions were answered and  he agreed to proceed.   OPERATIVE FINDINGS:  He had chronic adhesions from his prior  percutaneous  cholecystostomy tube.  He had some acute on chronic  adhesions of greater omentum as well as transverse colon and mesocolon.   His cholangiogram showed a dilated common bile duct with no evidence of  any obstruction.  I cannulated the cystic duct.   DESCRIPTION OF PROCEDURE:  Informed consent was first confirmed.  The  patient was already on IV Unasyn.  He underwent general anesthesia  without any difficulty.  He had sequential compression devices active  during the entire case.  His Lovenox had been held this morning.  His  Plavix had been held for 6 days.  He underwent general anesthesia  without any difficulty.  He was positioned supine, with arms out.  His  abdomen was prepped and draped in sterile fashion.   A 5-mm port was placed in the right upper quadrant using optical entry  technique with the patient in steep reverse Trendelenburg and right side  up.  A camera inspection revealed no intra-abdominal injury.  Under  direct visualization, final ports were placed in the right flank x2 and  a 10-mm port was placed in the subxiphoid region.   There was  dense inflammation in the right upper quadrant.  I followed  the transverse colon from the hepatic flexure over to its adhesions on  to the gallbladder.  Through careful sharp, blunt, and focused cautery  dissection, I was able to peel off the transverse colon from its  mesocolon off the gallbladder.  Once it freed out, I peeled that down.  We could better see the gallbladder.  The liver and gallbladder were  densely adherent up to the anterior chest wall and held up.  Peritoneum  was freed off the posterolateral wall of the gallbladder turning in to  the dome and to move proximally as possible.  This helped free up some  of the greater omentum attachments as well and freeing the anteromedial  peritoneal coverage from the gallbladder and liver as well.  However,  most of his gallbladder was as hard as concrete and his infundibulum  was  rather thickened and inflamed.   Therefore, I had to transition over to dome down technique.  The  gallbladder fundus was grasped, and I was able to free off the  gallbladder and peel it down off the gallbladder fossa of the liver bed  using cautery and blunt dissection.  Eventually, I was able to peel it  back down until it came to 2 obvious structures on the anterior and  medial side of the gallbladder.  One was coming up on the anterior  medial wall consistent with the cystic artery.  One clip on the  gallbladder site, two clips were placed on the structure, and it was  transected just coming right off the gallbladder.  It had been  skeletonized to make sure there was an end-vessel and there was no loop  to it.   This left one structure going from the gallbladder down to the porta  hepatis and that was the only thing attached to the gallbladder.  It was  skeletonized and followed down.  He had a short cystic duct.   A partial cystectomy was performed.  A 5-French cholangiocatheter was  placed through a subcostal stab incision, and placed on the cystic duct.  A cholangiogram was run using dilute radiopaque contrast and continuous  fluoroscopy.  Contrast flowed from a slightly helical side branch  consistent with cystic duct cannulization.  It went to a very dilated  common bile duct and refluxed up into the right and left intrahepatic  chains.  The contrast easily flowed down the normal feeling ampulla into  the duodenum.  There was no evidence of any definite defects.  We ran  the cholangiogram again at a different angle to make sure since the  right branches seemed to be moving around somewhat anteriorly and  overlapping with the cystic duct initially, but on reinspection I could  see that the cystic lit up first and was distinct from everything else.  Cholangiogram catheter was removed.  I placed an Endoloop around the  gallbladder all the way down to the cystic duct and  ligated it.  I  placed three clips on the cystic duct as well and cystic duct  transection was done right at the infundibulum.   Gallbladder was placed inside the EndoCatch bag and removed from the  subxiphoid port with some general dilation.  The facial defect over the  subxiphoid region was reapproximated using a 0 Vicryl stitch in figure-  of-eight fashion using laparoscopic suture passer.   Copious irrigation of over 3 L was done.  There were hemostasis assured  on the liver bed.  Clips were intact on the cystic duct and arterial  stumps.  There was still some phlegmon on the liver bed from some part  of posterior wall of the gallbladder, but I decided to leave that in  place, and everything was cleaned at the end.  Hemostasis was excellent  on the mesocolon as well.  Capnoperitoneum was evacuated, and ports were  removed.  The ports were tied down.  Skin was closed using 4-0 Monocryl  stitch.  Sterile dressing was applied.   The patient was extubated and sent to recovery room in stable condition.  We will return him back to Telemetry under care of Medicine and  Cardiology.   I am about to discuss the postoperative care with the patient's family.      Jesus Sportsman, MD  Electronically Signed     SCG/MEDQ  D:  05/07/2008  T:  05/08/2008  Job:  [id number]   cc:   Lyn Records, M.D.  Armanda Magic, M.D.  Deirdre Peer. Polite, M.D.

## 2010-09-19 NOTE — Discharge Summary (Signed)
Jesus Beck, Jesus Beck NO.:  [id number]   MEDICAL RECORD NO.:  [id number]          PATIENT TYPE:  INP   LOCATION:  4733                         FACILITY:  [facility]   PHYSICIAN:  Deirdre Peer. Polite, M.D. DATE OF BIRTH:  1926-04-30   DATE OF ADMISSION:  11/24/2007  DATE OF DISCHARGE:  12/02/2007                               DISCHARGE SUMMARY   DISCHARGE DIAGNOSES:  1. Cholecystitis status post percutaneous drain.  2. One of two positive blood cultures enterococcus sensitive to      ampicillin, discharged to home on Augmentin 875 b.i.d.  3. Right ankle focal cellulitis.  4. Coronary artery disease, history of coronary artery bypass grafting      and drug-eluting stent in June 2009.  5. Paroxysmal atrial fibrillation.  6. Hiatal hernia/gastroesophageal reflux disease.  7. Hypertension.  8. Hyperlipidemia.   DISCHARGE MEDICATIONS:  1. Tramadol 50 mg p.r.n.  2. Amlodipine 5 mg daily.  3. Atacand 16 mg daily.  4. Simvastatin 40 mg daily.  5. Isosorbide mono 60 mg daily.  6. Plavix 75 mg daily.  7. Aspirin 325 mg daily.  8. Tylenol p.r.n.  9. Betapace 80 mg b.i.d.  10.Augmentin 875 mg b.i.d.   DISPOSITION:  The patient discharged home in stable condition.   CONSULTANTS:  Dr. Mayford Knife, Cardiology, Interventional Radiology and  General Surgery.   PROCEDURES:  Cholecystostomy place November 29, 2007, by Interventional  Radiology.  MRI lower extremity probable focal cellulitis, abdominal  ultrasound cholelithiasis suggestive of cholecystitis, mild sludge in  the gallbladder without visualization of stone.   HISTORY OF PRESENT ILLNESS:  A 77 year old male presented to [facility] with complaint of fever and chills.  The patient was evaluated, was  felt to have acute cholecystitis and admission was deemed necessary for  further evaluation and treatment.  Please see dictated H&P for further  details.   PAST MEDICAL HISTORY/MEDICATIONS/SOCIAL HISTORY/PAST SURGICAL  HISTORY/ALLERGIES:  Per admission H&P.   HOSPITAL COURSE:  The patient was admitted to telemetry floor bed for  evaluation and management of acute cholecystitis.  The patient's course  was complicated by arrhythmia and elevated troponins.  It was felt that  the patient's troponin elevation was secondary to his arrhythmia.  Because of his recent stent placement, the patient's Plavix was not  held.  Surgery was consulted and was felt that percutaneous drain  will  be the best option at this time for this patient.  In the interim, the  patient was continued on antibiotics.  Cultures did show enterococcus.  The patient had mild erythema and pain on his right ankle initially  thought to be gout.  MRI suggested focal cellulitis.  I&D was consulted  and recommended complete 7 days of antibiotics and followup Augmentin.  The patient remained afebrile, remained without arrhythmia, was cleared  for discharge by Cardiology as well as General Surgery.  The patient was  discharged on above meds, was asked to continue a 7-day course of  Augmentin and have outpatient followup with GI, General Surgery,  Cardiology and Internal Medicine.  Deirdre Peer. Polite, M.D.  Electronically Signed     RDP/MEDQ  D:  04/01/2008  T:  04/01/2008  Job:  [id number]

## 2010-09-30 NOTE — Discharge Summary (Signed)
  NAMEJOSPH, NORFLEET NO.:  [id number]  MEDICAL RECORD NO.:  [id number]           PATIENT TYPE:  O  LOCATION:  1533                         FACILITY:  [facility]  PHYSICIAN:  Sharlet Salina T. Kaisy Severino, M.D.DATE OF BIRTH:  [birth year]  DATE OF ADMISSION:  09/08/2010 DATE OF DISCHARGE:  09/09/2010                              DISCHARGE SUMMARY   DISCHARGE DIAGNOSIS:  Right inguinal hernia.  OPERATIONS AND PROCEDURES:  Open repair of right inguinal hernia on Sep 08, 2010.  HISTORY:  Mr. Gudino is an 77 year old male who presents with a symptomatic enlarging right inguinal hernia.  Due to his age and comorbidities, he is admitted overnight following repair.  PAST MEDICAL HISTORY:  He had an episode of severe cholecystitis in 2009, underwent cholecystectomy.  He had a pleural effusion and VATS procedure.  After that period, he has had repair of his left inguinal hernia in the past.  He has remote history of coronary artery bypass grafting and has peripheral vascular disease and carotid disease.  MEDICATIONS:  Multiple, see H and P.  ALLERGIES:  None.  PERTINENT PHYSICAL EXAMINATION:  GENERAL:  Thin, reasonably healthy- appearing white male. ABDOMEN:  Pertinent for a reducible slightly tender right inguinal hernia.  HOSPITAL COURSE:  The patient underwent uneventful repair of his right inguinal hernia.  He was observed overnight and did very well without complications.  He was up and about wound healing nicely on the first postoperative day and he was discharged home.  He was given prescription for Vicodin for pain.  Will follow up in the office in 2 weeks.     Lorne Skeens. Nikea Settle, M.D.     Tory Emerald  D:  09/16/2010  T:  09/17/2010  Job:  [id number]  Electronically Signed by Glenna Fellows M.D. on 09/30/2010 10:03:29 AM

## 2010-12-23 ENCOUNTER — Other Ambulatory Visit: Payer: Self-pay | Admitting: Internal Medicine

## 2010-12-23 DIAGNOSIS — M545 Low back pain: Secondary | ICD-10-CM

## 2010-12-29 ENCOUNTER — Ambulatory Visit
Admission: RE | Admit: 2010-12-29 | Discharge: 2010-12-29 | Disposition: A | Discharge status: Home / Self Care | Payer: Medicare Other | Source: Ambulatory Visit | Attending: Internal Medicine | Admitting: Internal Medicine

## 2010-12-29 DIAGNOSIS — M545 Low back pain: Secondary | ICD-10-CM

## 2011-01-29 LAB — BASIC METABOLIC PANEL
BUN: 17
BUN: 24 — ABNORMAL HIGH
CO2: 27
Calcium: 9.4
Chloride: 102
Chloride: 107
Creatinine, Ser: 1.22
GFR calc Af Amer: >60
GFR calc non Af Amer: 50 — ABNORMAL LOW
Glucose, Bld: 83
Glucose, Bld: 98
Potassium: 3.9
Potassium: 4.7
Sodium: 141
Sodium: 142

## 2011-01-29 LAB — CBC
HCT: 41.5
MCHC: 35.1
MCV: 94.5
RBC: 4.39

## 2011-01-30 LAB — CBC
HCT: 34.5 — ABNORMAL LOW
HCT: 36.3 — ABNORMAL LOW
HCT: 38.6 — ABNORMAL LOW
Hemoglobin: 12.2 — ABNORMAL LOW
MCHC: 33.6
MCHC: 33.9
MCHC: 34.2
MCV: 94
Platelets: 120 — ABNORMAL LOW
Platelets: 192
Platelets: 87 — ABNORMAL LOW
Platelets: 90 — ABNORMAL LOW
RBC: 3.81 — ABNORMAL LOW
RBC: 3.86 — ABNORMAL LOW
RDW: 12.9
RDW: 13
RDW: 13.2
WBC: 12.5 — ABNORMAL HIGH
WBC: 9.9

## 2011-01-30 LAB — COMPREHENSIVE METABOLIC PANEL
ALT: 35
ALT: 81 — ABNORMAL HIGH
AST: 173 — ABNORMAL HIGH
AST: 22
AST: 29
Albumin: 2.1 — ABNORMAL LOW
Albumin: 2.7 — ABNORMAL LOW
Albumin: 3.5
Alkaline Phosphatase: 148 — ABNORMAL HIGH
Alkaline Phosphatase: 155 — ABNORMAL HIGH
Alkaline Phosphatase: 83
BUN: 15
BUN: 18
CO2: 27
CO2: 29
Calcium: 8 — ABNORMAL LOW
Calcium: 8.4
Calcium: 9.1
Chloride: 103
Chloride: 105
Creatinine, Ser: 1.21
GFR calc Af Amer: >60
GFR calc Af Amer: >60
GFR calc Af Amer: >60
GFR calc non Af Amer: 58 — ABNORMAL LOW
GFR calc non Af Amer: 58 — ABNORMAL LOW
Glucose, Bld: 105 — ABNORMAL HIGH
Glucose, Bld: 98
Potassium: 3.4 — ABNORMAL LOW
Potassium: 4.1
Potassium: 5.4 — ABNORMAL HIGH
Sodium: 135
Sodium: 136
Sodium: 138
Sodium: 141
Total Bilirubin: 1.5 — ABNORMAL HIGH
Total Bilirubin: 2.6 — ABNORMAL HIGH
Total Protein: 4.7 — ABNORMAL LOW
Total Protein: 5 — ABNORMAL LOW
Total Protein: 5.4 — ABNORMAL LOW
Total Protein: 6.4

## 2011-01-30 LAB — CULTURE, BLOOD (ROUTINE X 2)

## 2011-01-30 LAB — DIFFERENTIAL
Basophils Absolute: 0
Basophils Relative: 0
Basophils Relative: 0
Eosinophils Absolute: 0
Eosinophils Relative: 0
Monocytes Absolute: 0.6
Monocytes Absolute: 1.1 — ABNORMAL HIGH
Monocytes Relative: 5
Neutro Abs: 17.2 — ABNORMAL HIGH

## 2011-01-30 LAB — CARDIAC PANEL(CRET KIN+CKTOT+MB+TROPI)
CK, MB: 3.8
Relative Index: 0.5
Total CK: 812 — ABNORMAL HIGH
Troponin I: 0.52

## 2011-01-30 LAB — TROPONIN I: Troponin I: 0.97

## 2011-01-30 LAB — PROTIME-INR
INR: 1.3
Prothrombin Time: 16.8 — ABNORMAL HIGH

## 2011-01-30 LAB — BASIC METABOLIC PANEL
BUN: 27 — ABNORMAL HIGH
CO2: 21
Chloride: 104
Chloride: 106
Creatinine, Ser: 1.14
GFR calc Af Amer: >60
GFR calc non Af Amer: 47 — ABNORMAL LOW
Glucose, Bld: 171 — ABNORMAL HIGH
Potassium: 3.7
Potassium: 4
Sodium: 143

## 2011-01-30 LAB — CK TOTAL AND CKMB (NOT AT ARMC)
Relative Index: 0.3
Relative Index: 0.5

## 2011-01-30 LAB — POCT CARDIAC MARKERS: Troponin i, poc: 0.22 — ABNORMAL HIGH

## 2011-01-30 LAB — BODY FLUID CULTURE

## 2011-01-30 LAB — URIC ACID: Uric Acid, Serum: 4.9

## 2011-01-30 LAB — APTT: aPTT: 46 — ABNORMAL HIGH

## 2011-02-06 LAB — COMPREHENSIVE METABOLIC PANEL WITH GFR
ALT: 15 U/L (ref 0–53)
ALT: 91 U/L — ABNORMAL HIGH (ref 0–53)
AST: 16 U/L (ref 0–37)
AST: 42 U/L — ABNORMAL HIGH (ref 0–37)
Albumin: 2.5 g/dL — ABNORMAL LOW (ref 3.5–5.2)
Albumin: 2.6 g/dL — ABNORMAL LOW (ref 3.5–5.2)
Alkaline Phosphatase: 155 U/L — ABNORMAL HIGH (ref 39–117)
Alkaline Phosphatase: 65 U/L (ref 39–117)
BUN: 22 mg/dL (ref 6–23)
BUN: 26 mg/dL — ABNORMAL HIGH (ref 6–23)
CO2: 25 meq/L (ref 19–32)
CO2: 28 meq/L (ref 19–32)
Calcium: 8.9 mg/dL (ref 8.4–10.5)
Calcium: 9 mg/dL (ref 8.4–10.5)
Chloride: 102 meq/L (ref 96–112)
Chloride: 99 meq/L (ref 96–112)
Creatinine, Ser: 1.36 mg/dL (ref 0.4–1.5)
Creatinine, Ser: 1.46 mg/dL (ref 0.4–1.5)
GFR calc non Af Amer: 46 mL/min — ABNORMAL LOW
GFR calc non Af Amer: 50 mL/min — ABNORMAL LOW
Glucose, Bld: 118 mg/dL — ABNORMAL HIGH (ref 70–99)
Glucose, Bld: 142 mg/dL — ABNORMAL HIGH (ref 70–99)
Potassium: 3.5 meq/L (ref 3.5–5.1)
Potassium: 3.8 meq/L (ref 3.5–5.1)
Sodium: 135 meq/L (ref 135–145)
Sodium: 136 meq/L (ref 135–145)
Total Bilirubin: 1.5 mg/dL — ABNORMAL HIGH (ref 0.3–1.2)
Total Bilirubin: 1.9 mg/dL — ABNORMAL HIGH (ref 0.3–1.2)
Total Protein: 5.3 g/dL — ABNORMAL LOW (ref 6.0–8.3)
Total Protein: 5.5 g/dL — ABNORMAL LOW (ref 6.0–8.3)

## 2011-02-06 LAB — DIFFERENTIAL
Basophils Absolute: 0 K/uL (ref 0.0–0.1)
Basophils Relative: 0 % (ref 0–1)
Basophils Relative: 0 % (ref 0–1)
Eosinophils Absolute: 0 K/uL (ref 0.0–0.7)
Eosinophils Absolute: 0.1 10*3/uL (ref 0.0–0.7)
Eosinophils Relative: 0 % (ref 0–5)
Lymphocytes Relative: 7 % — ABNORMAL LOW (ref 12–46)
Lymphs Abs: 1 K/uL (ref 0.7–4.0)
Monocytes Absolute: 0.8 10*3/uL (ref 0.1–1.0)
Monocytes Absolute: 1.1 K/uL — ABNORMAL HIGH (ref 0.1–1.0)
Monocytes Relative: 8 % (ref 3–12)
Monocytes Relative: 8 % (ref 3–12)
Neutro Abs: 11 K/uL — ABNORMAL HIGH (ref 1.7–7.7)
Neutrophils Relative %: 83 % — ABNORMAL HIGH (ref 43–77)
Neutrophils Relative %: 84 % — ABNORMAL HIGH (ref 43–77)

## 2011-02-06 LAB — URINALYSIS, ROUTINE W REFLEX MICROSCOPIC
Bilirubin Urine: NEGATIVE
Glucose, UA: NEGATIVE mg/dL
Ketones, ur: NEGATIVE mg/dL
Leukocytes, UA: NEGATIVE
Nitrite: NEGATIVE
Protein, ur: NEGATIVE mg/dL
Specific Gravity, Urine: 1.016 (ref 1.005–1.030)
Urobilinogen, UA: 1 mg/dL (ref 0.0–1.0)
pH: 6.5 (ref 5.0–8.0)

## 2011-02-06 LAB — BASIC METABOLIC PANEL
BUN: 25 mg/dL — ABNORMAL HIGH (ref 6–23)
BUN: 26 mg/dL — ABNORMAL HIGH (ref 6–23)
Calcium: 8.4 mg/dL (ref 8.4–10.5)
Calcium: 8.7 mg/dL (ref 8.4–10.5)
Calcium: 8.9 mg/dL (ref 8.4–10.5)
Chloride: 95 mEq/L — ABNORMAL LOW (ref 96–112)
Creatinine, Ser: 1.47 mg/dL (ref 0.4–1.5)
GFR calc Af Amer: 51 mL/min — ABNORMAL LOW (ref >60)
GFR calc Af Amer: 56 mL/min — ABNORMAL LOW (ref >60)
GFR calc non Af Amer: 42 mL/min — ABNORMAL LOW (ref >60)
GFR calc non Af Amer: 46 mL/min — ABNORMAL LOW (ref >60)
GFR calc non Af Amer: 46 mL/min — ABNORMAL LOW (ref >60)
Potassium: 3.4 mEq/L — ABNORMAL LOW (ref 3.5–5.1)
Potassium: 3.4 mEq/L — ABNORMAL LOW (ref 3.5–5.1)
Sodium: 136 mEq/L (ref 135–145)

## 2011-02-06 LAB — CBC
HCT: 31.3 % — ABNORMAL LOW (ref 39.0–52.0)
HCT: 32.8 % — ABNORMAL LOW (ref 39.0–52.0)
HCT: 36.3 % — ABNORMAL LOW (ref 39.0–52.0)
Hemoglobin: 10.7 g/dL — ABNORMAL LOW (ref 13.0–17.0)
Hemoglobin: 11.4 g/dL — ABNORMAL LOW (ref 13.0–17.0)
Hemoglobin: 11.6 g/dL — ABNORMAL LOW (ref 13.0–17.0)
MCHC: 34.2 g/dL (ref 30.0–36.0)
MCHC: 34.3 g/dL (ref 30.0–36.0)
MCHC: 34.9 g/dL (ref 30.0–36.0)
MCV: 82.7 fL (ref 78.0–100.0)
MCV: 91.1 fL (ref 78.0–100.0)
MCV: 92.3 fL (ref 78.0–100.0)
Platelets: 113 K/uL — ABNORMAL LOW (ref 150–400)
Platelets: 118 10*3/uL — ABNORMAL LOW (ref 150–400)
RBC: 3.39 MIL/uL — ABNORMAL LOW (ref 4.22–5.81)
RBC: 4.39 MIL/uL (ref 4.22–5.81)
RDW: 12.2 % (ref 11.5–15.5)
RDW: 12.3 % (ref 11.5–15.5)
RDW: 12.9 % (ref 11.5–15.5)
WBC: 10.2 K/uL (ref 4.0–10.5)
WBC: 13 10*3/uL — ABNORMAL HIGH (ref 4.0–10.5)
WBC: 8.3 10*3/uL (ref 4.0–10.5)

## 2011-02-06 LAB — CARDIAC PANEL(CRET KIN+CKTOT+MB+TROPI)
CK, MB: 1.9 ng/mL (ref 0.3–4.0)
CK, MB: 7 ng/mL — ABNORMAL HIGH (ref 0.3–4.0)
Relative Index: INVALID (ref 0.0–2.5)
Total CK: 82 U/L (ref 7–232)

## 2011-02-06 LAB — COMPREHENSIVE METABOLIC PANEL
ALT: 156 U/L — ABNORMAL HIGH (ref 0–53)
Alkaline Phosphatase: 172 U/L — ABNORMAL HIGH (ref 39–117)
BUN: 30 mg/dL — ABNORMAL HIGH (ref 6–23)
CO2: 27 mEq/L (ref 19–32)
Chloride: 101 mEq/L (ref 96–112)
Chloride: 101 mEq/L (ref 96–112)
Creatinine, Ser: 1.46 mg/dL (ref 0.4–1.5)
GFR calc non Af Amer: 46 mL/min — ABNORMAL LOW (ref >60)
Glucose, Bld: 134 mg/dL — ABNORMAL HIGH (ref 70–99)
Glucose, Bld: 154 mg/dL — ABNORMAL HIGH (ref 70–99)
Potassium: 3.6 mEq/L (ref 3.5–5.1)
Sodium: 135 mEq/L (ref 135–145)
Total Bilirubin: 2 mg/dL — ABNORMAL HIGH (ref 0.3–1.2)
Total Protein: 5.3 g/dL — ABNORMAL LOW (ref 6.0–8.3)

## 2011-02-06 LAB — PROTIME-INR
INR: 1.2 (ref 0.00–1.49)
Prothrombin Time: 15.1 seconds (ref 11.6–15.2)

## 2011-02-06 LAB — URINE MICROSCOPIC-ADD ON

## 2011-02-06 LAB — B-NATRIURETIC PEPTIDE (CONVERTED LAB)
Pro B Natriuretic peptide (BNP): 181 pg/mL — ABNORMAL HIGH (ref 0.0–100.0)
Pro B Natriuretic peptide (BNP): 717 pg/mL — ABNORMAL HIGH (ref 0.0–100.0)

## 2011-02-06 LAB — HEPARIN LEVEL (UNFRACTIONATED)
Heparin Unfractionated: 0.46 IU/mL (ref 0.30–0.70)
Heparin Unfractionated: <0.1 IU/mL — ABNORMAL LOW (ref 0.30–0.70)

## 2011-02-06 LAB — URINE CULTURE

## 2011-02-06 LAB — CK TOTAL AND CKMB (NOT AT ARMC)
CK, MB: 17 ng/mL — ABNORMAL HIGH (ref 0.3–4.0)
CK, MB: 7.9 ng/mL — ABNORMAL HIGH (ref 0.3–4.0)
Total CK: 191 U/L (ref 7–232)
Total CK: 348 U/L — ABNORMAL HIGH (ref 7–232)

## 2011-02-06 LAB — TROPONIN I: Troponin I: 0.06 ng/mL (ref 0.00–0.06)

## 2011-07-15 DIAGNOSIS — H43399 Other vitreous opacities, unspecified eye: Secondary | ICD-10-CM | POA: Diagnosis not present

## 2011-07-15 DIAGNOSIS — H04129 Dry eye syndrome of unspecified lacrimal gland: Secondary | ICD-10-CM | POA: Diagnosis not present

## 2011-07-15 DIAGNOSIS — Z9849 Cataract extraction status, unspecified eye: Secondary | ICD-10-CM | POA: Diagnosis not present

## 2011-07-15 DIAGNOSIS — H5210 Myopia, unspecified eye: Secondary | ICD-10-CM | POA: Diagnosis not present

## 2011-07-20 DIAGNOSIS — Z79899 Other long term (current) drug therapy: Secondary | ICD-10-CM | POA: Diagnosis not present

## 2011-07-20 DIAGNOSIS — E7800 Pure hypercholesterolemia, unspecified: Secondary | ICD-10-CM | POA: Diagnosis not present

## 2011-08-24 DIAGNOSIS — M2550 Pain in unspecified joint: Secondary | ICD-10-CM | POA: Diagnosis not present

## 2011-08-31 DIAGNOSIS — I2510 Atherosclerotic heart disease of native coronary artery without angina pectoris: Secondary | ICD-10-CM | POA: Diagnosis not present

## 2011-08-31 DIAGNOSIS — I10 Essential (primary) hypertension: Secondary | ICD-10-CM | POA: Diagnosis not present

## 2011-08-31 DIAGNOSIS — I4891 Unspecified atrial fibrillation: Secondary | ICD-10-CM | POA: Diagnosis not present

## 2011-09-29 DIAGNOSIS — I10 Essential (primary) hypertension: Secondary | ICD-10-CM | POA: Diagnosis not present

## 2011-09-29 DIAGNOSIS — E785 Hyperlipidemia, unspecified: Secondary | ICD-10-CM | POA: Diagnosis not present

## 2011-09-29 DIAGNOSIS — M545 Low back pain: Secondary | ICD-10-CM | POA: Diagnosis not present

## 2011-10-23 DIAGNOSIS — R609 Edema, unspecified: Secondary | ICD-10-CM | POA: Diagnosis not present

## 2011-12-30 DIAGNOSIS — Z23 Encounter for immunization: Secondary | ICD-10-CM | POA: Diagnosis not present

## 2012-01-26 DIAGNOSIS — E7800 Pure hypercholesterolemia, unspecified: Secondary | ICD-10-CM | POA: Diagnosis not present

## 2012-02-25 DIAGNOSIS — Z23 Encounter for immunization: Secondary | ICD-10-CM | POA: Diagnosis not present

## 2012-02-25 DIAGNOSIS — R21 Rash and other nonspecific skin eruption: Secondary | ICD-10-CM | POA: Diagnosis not present

## 2012-02-29 DIAGNOSIS — E7800 Pure hypercholesterolemia, unspecified: Secondary | ICD-10-CM | POA: Diagnosis not present

## 2012-02-29 DIAGNOSIS — I4891 Unspecified atrial fibrillation: Secondary | ICD-10-CM | POA: Diagnosis not present

## 2012-02-29 DIAGNOSIS — I2510 Atherosclerotic heart disease of native coronary artery without angina pectoris: Secondary | ICD-10-CM | POA: Diagnosis not present

## 2012-02-29 DIAGNOSIS — I10 Essential (primary) hypertension: Secondary | ICD-10-CM | POA: Diagnosis not present

## 2012-03-28 DIAGNOSIS — I10 Essential (primary) hypertension: Secondary | ICD-10-CM | POA: Diagnosis not present

## 2012-03-28 DIAGNOSIS — E7800 Pure hypercholesterolemia, unspecified: Secondary | ICD-10-CM | POA: Diagnosis not present

## 2012-06-13 DIAGNOSIS — M545 Low back pain: Secondary | ICD-10-CM | POA: Diagnosis not present

## 2012-08-30 DIAGNOSIS — E7800 Pure hypercholesterolemia, unspecified: Secondary | ICD-10-CM | POA: Diagnosis not present

## 2012-08-30 DIAGNOSIS — I4891 Unspecified atrial fibrillation: Secondary | ICD-10-CM | POA: Diagnosis not present

## 2012-08-30 DIAGNOSIS — I2510 Atherosclerotic heart disease of native coronary artery without angina pectoris: Secondary | ICD-10-CM | POA: Diagnosis not present

## 2012-08-30 DIAGNOSIS — I10 Essential (primary) hypertension: Secondary | ICD-10-CM | POA: Diagnosis not present

## 2012-09-19 DIAGNOSIS — I10 Essential (primary) hypertension: Secondary | ICD-10-CM | POA: Diagnosis not present

## 2012-09-19 DIAGNOSIS — Z0000 Encounter for general adult medical examination without abnormal findings: Secondary | ICD-10-CM | POA: Diagnosis not present

## 2012-09-19 DIAGNOSIS — E785 Hyperlipidemia, unspecified: Secondary | ICD-10-CM | POA: Diagnosis not present

## 2012-10-17 DIAGNOSIS — R21 Rash and other nonspecific skin eruption: Secondary | ICD-10-CM | POA: Diagnosis not present

## 2012-10-31 DIAGNOSIS — R21 Rash and other nonspecific skin eruption: Secondary | ICD-10-CM | POA: Diagnosis not present

## 2012-10-31 DIAGNOSIS — I129 Hypertensive chronic kidney disease with stage 1 through stage 4 chronic kidney disease, or unspecified chronic kidney disease: Secondary | ICD-10-CM | POA: Diagnosis not present

## 2012-11-01 ENCOUNTER — Other Ambulatory Visit: Payer: Self-pay | Admitting: Dermatology

## 2012-11-01 DIAGNOSIS — D485 Neoplasm of uncertain behavior of skin: Secondary | ICD-10-CM | POA: Diagnosis not present

## 2012-11-01 DIAGNOSIS — L851 Acquired keratosis [keratoderma] palmaris et plantaris: Secondary | ICD-10-CM | POA: Diagnosis not present

## 2013-01-27 DIAGNOSIS — I2510 Atherosclerotic heart disease of native coronary artery without angina pectoris: Secondary | ICD-10-CM | POA: Diagnosis not present

## 2013-01-27 DIAGNOSIS — Z79899 Other long term (current) drug therapy: Secondary | ICD-10-CM | POA: Diagnosis not present

## 2013-01-31 ENCOUNTER — Other Ambulatory Visit: Payer: Self-pay | Admitting: Cardiology

## 2013-01-31 DIAGNOSIS — Z79899 Other long term (current) drug therapy: Secondary | ICD-10-CM

## 2013-01-31 DIAGNOSIS — E7800 Pure hypercholesterolemia, unspecified: Secondary | ICD-10-CM

## 2013-02-24 DIAGNOSIS — Z23 Encounter for immunization: Secondary | ICD-10-CM | POA: Diagnosis not present

## 2013-02-24 DIAGNOSIS — M545 Low back pain: Secondary | ICD-10-CM | POA: Diagnosis not present

## 2013-02-28 ENCOUNTER — Encounter: Payer: Self-pay | Admitting: Cardiology

## 2013-02-28 ENCOUNTER — Encounter: Payer: Self-pay | Admitting: *Deleted

## 2013-03-01 ENCOUNTER — Encounter: Payer: Self-pay | Admitting: Cardiology

## 2013-03-01 ENCOUNTER — Ambulatory Visit (INDEPENDENT_AMBULATORY_CARE_PROVIDER_SITE_OTHER): Payer: Medicare Other | Admitting: Cardiology

## 2013-03-01 ENCOUNTER — Other Ambulatory Visit: Payer: Self-pay | Admitting: Cardiology

## 2013-03-01 ENCOUNTER — Encounter (INDEPENDENT_AMBULATORY_CARE_PROVIDER_SITE_OTHER): Payer: Self-pay

## 2013-03-01 VITALS — BP 140/70 | HR 82 | Ht 68.0 in | Wt 158.0 lb

## 2013-03-01 DIAGNOSIS — I4891 Unspecified atrial fibrillation: Secondary | ICD-10-CM | POA: Diagnosis not present

## 2013-03-01 DIAGNOSIS — I6523 Occlusion and stenosis of bilateral carotid arteries: Secondary | ICD-10-CM

## 2013-03-01 DIAGNOSIS — I2510 Atherosclerotic heart disease of native coronary artery without angina pectoris: Secondary | ICD-10-CM

## 2013-03-01 DIAGNOSIS — I6529 Occlusion and stenosis of unspecified carotid artery: Secondary | ICD-10-CM | POA: Insufficient documentation

## 2013-03-01 DIAGNOSIS — E7800 Pure hypercholesterolemia, unspecified: Secondary | ICD-10-CM | POA: Insufficient documentation

## 2013-03-01 DIAGNOSIS — I4510 Unspecified right bundle-branch block: Secondary | ICD-10-CM | POA: Diagnosis not present

## 2013-03-01 DIAGNOSIS — I10 Essential (primary) hypertension: Secondary | ICD-10-CM

## 2013-03-01 DIAGNOSIS — I658 Occlusion and stenosis of other precerebral arteries: Secondary | ICD-10-CM

## 2013-03-01 MED ORDER — APIXABAN 2.5 MG PO TABS: 2.5000 mg | ORAL_TABLET | Freq: Two times a day (BID) | ORAL | Status: DC

## 2013-03-01 NOTE — Patient Instructions (Signed)
Your physician has recommended you make the following change in your medication:   1. Start Eliquis 2.5 mg twice daily. 2. Stop Aspirin  Your are being referred to Pharmacy: 1 month  Your physician recommends that you return for lab work in: 1 month, CBCw/diff, BMET

## 2013-03-01 NOTE — Progress Notes (Signed)
[address] [place], [place]  [address] Phone: [phone] Fax:  [phone]  Date:  03/01/2013   ID:  Jesus Beck, DOB 1925-08-28, MRN [id number]  PCP:  Katy Apo, MD   History of Present Illness: Jesus Beck is a 79 y.o. male with coronary artery disease status post bypass in 1999 with hypertension, hyperlipidemia, OSA-noncompliant with mask, paroxysmal atrial fibrillation previously on sotalol.  His heart rate at one point was 44 with right bundle branch block. [employer]. Gave me a pen- that his brother made.  He is currently asymptomatic in relation to his atrial fibrillation. He has been doing well, no syncope, no strokelike symptoms. We discussed at length risk of stroke, anticoagulation needs. Discussed with pharmacy.  Occasionally he would get ankle edema. He was placed on HCTZ for that. He did develop a rash along his chest wall and this medication was stopped several months ago. He saw dermatologist for the rash as well. Unsure what was the ultimate cause for the rash. I've asked him to stop his HCTZ  Wt Readings from Last 3 Encounters:  03/01/13 158 lb (71.668 kg)     Past Medical History  Diagnosis Date  . Hypercholesteremia   . HTN (hypertension)   . Back pain   . Atrial fibrillation     paroxysmal atrial fibrillation- stopped sotolol due to HR 44 (on 40mg  BID)  . Hypersomnia   . Dyslipidemia   . CAD (coronary artery disease)     s/p CABG 1999 with LIMA to MAD, SVG to first diagonal, SVG to RCA and PCI with DES mid LAD 06/09 and residual 70% D2 not amenable to PCI due to small caliber Cardiology Dr. Anne Fu  . RBBB   . Carotid artery stenosis      s/p bialteral CEA's 2004 with persistent bruit on left and minimal plaque by carotid dopplers 2010  . Renal insufficiency     Past Surgical History  Procedure Laterality Date  . Cholecystectomy    . Coronary artery bypass graft    . Hernia repair    . Intraocular lens insertion       Current Outpatient Prescriptions  Medication Sig Dispense Refill  . amLODipine (NORVASC) 5 MG tablet Take 5 mg by mouth daily.      Marland Kitchen aspirin 81 MG tablet Take 81 mg by mouth daily.      . hydrochlorothiazide (HYDRODIURIL) 25 MG tablet Take 25 mg by mouth daily.      Marland Kitchen HYDROcodone-acetaminophen (NORCO/VICODIN) 5-325 MG per tablet Take 1 tablet by mouth every 6 (six) hours as needed for pain.      . isosorbide mononitrate (IMDUR) 60 MG 24 hr tablet Take 90 mg by mouth daily.       Marland Kitchen losartan (COZAAR) 100 MG tablet Take 100 mg by mouth daily.      . methocarbamol (ROBAXIN) 500 MG tablet Take 1,000 mg by mouth 2 (two) times daily.      . nitroGLYCERIN (NITROSTAT) 0.4 MG SL tablet Place 0.4 mg under the tongue every 5 (five) minutes as needed for chest pain.      . simvastatin (ZOCOR) 20 MG tablet Take 20 mg by mouth every evening.      . traMADol (ULTRAM) 50 MG tablet        No current facility-administered medications for this visit.    Allergies:    Allergies  Allergen Reactions  . Lipitor [Atorvastatin]  Social History:  The patient  reports that he has never smoked. He does not have any smokeless tobacco history on file. He reports that he does not drink alcohol or use illicit drugs.   ROS:  Please see the history of present illness.   Denies any bleeding, syncope, orthopnea, PND. Low back pain. No syncope, no CP, no SOB.   PHYSICAL EXAM: VS:  BP 140/70  Pulse 82  Ht 5\' 8"  (1.727 m)  Wt 158 lb (71.668 kg)  BMI 24.03 kg/m2 Well nourished, well developed, in no acute distress HEENT: normal Neck: no JVD Cardiac:  irreg irreg, no murmur Lungs:  clear to auscultation bilaterally, no wheezing, rhonchi or rales Abd: soft, nontender, no hepatomegaly Ext: no edema Skin: warm and dry Neuro: no focal abnormalities noted  EKG:  Atrial fibrillation heart rate 82 with right bundle branch block , left posterior fascicular block    ASSESSMENT AND PLAN:  1. Paroxysmal atrial  fibrillation-was previously on sotalol. Atrial fibrillation currently noted on EKG. I had discussion with him about anticoagulation. He is at increased risk for stroke. 2. Chronic anticoagulation-we will start Eliquis 2.5 mg twice a day. Watch for any signs of bleeding. We will check basic metabolic profile and CBC in one month. 3. Coronary artery disease-prior bypass 4. Carotid artery disease-prior endarterectomy 5. Obstructive sleep apnea -- unfortunately did not tolerate the mask well. 6. Hypertension-currently doing well. The patient reviewed. 7. Hyperlipidemia-continue with simvastatin  Signed, Donato Schultz, MD [facility]  03/01/2013 2:39 PM

## 2013-03-20 DIAGNOSIS — I10 Essential (primary) hypertension: Secondary | ICD-10-CM | POA: Diagnosis not present

## 2013-03-20 DIAGNOSIS — I4891 Unspecified atrial fibrillation: Secondary | ICD-10-CM | POA: Diagnosis not present

## 2013-03-20 DIAGNOSIS — M549 Dorsalgia, unspecified: Secondary | ICD-10-CM | POA: Diagnosis not present

## 2013-03-20 DIAGNOSIS — E7800 Pure hypercholesterolemia, unspecified: Secondary | ICD-10-CM | POA: Diagnosis not present

## 2013-03-22 DIAGNOSIS — E7800 Pure hypercholesterolemia, unspecified: Secondary | ICD-10-CM | POA: Diagnosis not present

## 2013-03-22 DIAGNOSIS — I10 Essential (primary) hypertension: Secondary | ICD-10-CM | POA: Diagnosis not present

## 2013-03-27 ENCOUNTER — Other Ambulatory Visit (INDEPENDENT_AMBULATORY_CARE_PROVIDER_SITE_OTHER): Payer: Medicare Other

## 2013-03-27 DIAGNOSIS — I2510 Atherosclerotic heart disease of native coronary artery without angina pectoris: Secondary | ICD-10-CM

## 2013-03-27 DIAGNOSIS — E7800 Pure hypercholesterolemia, unspecified: Secondary | ICD-10-CM

## 2013-03-27 DIAGNOSIS — Z79899 Other long term (current) drug therapy: Secondary | ICD-10-CM

## 2013-03-27 DIAGNOSIS — I4891 Unspecified atrial fibrillation: Secondary | ICD-10-CM | POA: Diagnosis not present

## 2013-03-27 LAB — BASIC METABOLIC PANEL
BUN: 23 mg/dL (ref 6–23)
CO2: 29 mEq/L (ref 19–32)
Chloride: 104 mEq/L (ref 96–112)
Creatinine, Ser: 1.5 mg/dL (ref 0.4–1.5)
Potassium: 4.5 mEq/L (ref 3.5–5.1)

## 2013-03-27 LAB — LIPID PANEL
Cholesterol: 134 mg/dL (ref 0–200)
HDL: 51.5 mg/dL (ref >39.00)
Total CHOL/HDL Ratio: 3
Triglycerides: 83 mg/dL (ref 0.0–149.0)

## 2013-03-27 LAB — CBC WITH DIFFERENTIAL/PLATELET
Basophils Relative: 0.4 % (ref 0.0–3.0)
Eosinophils Absolute: 0.2 10*3/uL (ref 0.0–0.7)
Eosinophils Relative: 3.1 % (ref 0.0–5.0)
HCT: 40.8 % (ref 39.0–52.0)
Lymphs Abs: 0.9 10*3/uL (ref 0.7–4.0)
MCHC: 33.7 g/dL (ref 30.0–36.0)
MCV: 94 fl (ref 78.0–100.0)
Monocytes Absolute: 0.4 10*3/uL (ref 0.1–1.0)
Platelets: 144 10*3/uL — ABNORMAL LOW (ref 150.0–400.0)
WBC: 5.3 10*3/uL (ref 4.5–10.5)

## 2013-04-03 ENCOUNTER — Telehealth: Payer: Self-pay | Admitting: Cardiology

## 2013-04-03 ENCOUNTER — Telehealth: Payer: Self-pay | Admitting: *Deleted

## 2013-04-03 NOTE — Telephone Encounter (Signed)
lmtcb for lab results. Number provided 

## 2013-04-03 NOTE — Telephone Encounter (Signed)
New message ° ° ° ° °Pt want lab results °

## 2013-04-04 ENCOUNTER — Ambulatory Visit (INDEPENDENT_AMBULATORY_CARE_PROVIDER_SITE_OTHER): Payer: Medicare Other | Admitting: Cardiology

## 2013-04-04 ENCOUNTER — Encounter: Payer: Self-pay | Admitting: Cardiology

## 2013-04-04 VITALS — BP 136/74 | HR 76 | Ht 68.0 in

## 2013-04-04 DIAGNOSIS — Z7901 Long term (current) use of anticoagulants: Secondary | ICD-10-CM | POA: Diagnosis not present

## 2013-04-04 DIAGNOSIS — I4891 Unspecified atrial fibrillation: Secondary | ICD-10-CM | POA: Diagnosis not present

## 2013-04-04 DIAGNOSIS — L299 Pruritus, unspecified: Secondary | ICD-10-CM

## 2013-04-04 NOTE — Patient Instructions (Signed)
Your physician recommends that you schedule a follow-up appointment in: 1 month with Dr. Skains  Your physician recommends that you continue on your current medications as directed. Please refer to the Current Medication list given to you today.  

## 2013-04-04 NOTE — Progress Notes (Signed)
[address], [address] [place], [place]  [address] Phone: [phone] Fax:  [phone]  Date:  04/04/2013   ID:  Jesus Beck, DOB [birth year], MRN [id number]  PCP:  Katy Apo, MD   History of Present Illness: Jesus Beck is a 79 y.o. male with coronary artery disease status post bypass in 1999 with hypertension, hyperlipidemia, OSA-noncompliant with mask, paroxysmal atrial fibrillation previously on sotalol.  His heart rate at one point was 44 with right bundle branch block. [employer]. Gave me a pen- that his brother made.  He is currently asymptomatic in relation to his atrial fibrillation. He has been doing well, no syncope, no strokelike symptoms. We discussed at length risk of stroke, anticoagulation needs. Discussed with pharmacy.  Occasionally he would get ankle edema. He was placed on HCTZ for that. He did develop a rash along his chest wall and this medication was stopped several months ago. He saw dermatologist for the rash as well. Unsure what was the ultimate cause for the rash. I've asked him to stop his HCTZ.  Now he states that he is having itching since starting Eliquis. Different parts of body. Sometimes behind the shoulder, sometimes in the low back. No evidence of hives.  Wt Readings from Last 3 Encounters:  03/01/13 158 lb (71.668 kg)     Past Medical History  Diagnosis Date  . Hypercholesteremia   . HTN (hypertension)   . Back pain   . Atrial fibrillation     paroxysmal atrial fibrillation- stopped sotolol due to HR 44 (on 40mg  BID)  . Hypersomnia   . Dyslipidemia   . CAD (coronary artery disease)     s/p CABG 1999 with LIMA to MAD, SVG to first diagonal, SVG to RCA and PCI with DES mid LAD 06/09 and residual 70% D2 not amenable to PCI due to small caliber Cardiology Dr. Anne Fu  . RBBB   . Carotid artery stenosis      s/p bialteral CEA's 2004 with persistent bruit on left and minimal plaque by carotid dopplers 2010  . Renal  insufficiency     Past Surgical History  Procedure Laterality Date  . Cholecystectomy    . Coronary artery bypass graft    . Hernia repair    . Intraocular lens insertion      Current Outpatient Prescriptions  Medication Sig Dispense Refill  . amLODipine (NORVASC) 5 MG tablet Take 5 mg by mouth daily.      Marland Kitchen apixaban (ELIQUIS) 2.5 MG TABS tablet Take 1 tablet (2.5 mg total) by mouth 2 (two) times daily.  60 tablet  3  . HYDROcodone-acetaminophen (NORCO/VICODIN) 5-325 MG per tablet Take 1 tablet by mouth every 6 (six) hours as needed for pain.      . isosorbide mononitrate (IMDUR) 60 MG 24 hr tablet Take 90 mg by mouth daily.       Marland Kitchen losartan (COZAAR) 100 MG tablet Take 100 mg by mouth daily.      . nitroGLYCERIN (NITROSTAT) 0.4 MG SL tablet Place 0.4 mg under the tongue every 5 (five) minutes as needed for chest pain.      . Omega-3 Fatty Acids (OMEGA 3 PO) Take by mouth daily.      . simvastatin (ZOCOR) 20 MG tablet Take 20 mg by mouth every evening.      . traMADol (ULTRAM) 50 MG tablet Take 50 mg by mouth every 6 (six) hours as needed.  No current facility-administered medications for this visit.    Allergies:    Allergies  Allergen Reactions  . Lipitor [Atorvastatin]     Social History:  The patient  reports that he has never smoked. He does not have any smokeless tobacco history on file. He reports that he does not drink alcohol or use illicit drugs.   ROS:  Please see the history of present illness.   Denies any bleeding, syncope, orthopnea, PND. Low back pain. No syncope, no CP, no SOB.   PHYSICAL EXAM: VS:  BP 136/74  Pulse 76  Ht 5\' 8"  (1.727 m) Well nourished, well developed, in no acute distress HEENT: normal Neck: no JVD Cardiac:  irreg irreg, no murmur Lungs:  clear to auscultation bilaterally, no wheezing, rhonchi or rales Abd: soft, nontender, no hepatomegaly Ext: no edema Skin: warm and dry Neuro: no focal abnormalities noted  EKG:  Atrial  fibrillation heart rate 82 with right bundle branch block , left posterior fascicular block    ASSESSMENT AND PLAN:  1. Paroxysmal atrial fibrillation-was previously on sotalol. Atrial fibrillation currently noted on EKG. I had discussion with him about anticoagulation. He is at increased risk for stroke. 2. Chronic anticoagulation-newly started Eliquis 2.5 mg twice a day for about a month. He stated that he had periodic itching on his shoulder that would last a few seconds then it may be on his back, lower back. No evidence of any rash. Certainly nowhere near as significant as previous HCTZ hives. I've encouraged him to continue with the medication and we will see him back in one month for followup. I've also asked him to use lotion to see if this helps with his itching. Certainly could be dry skin in the winter months. 3. Coronary artery disease-prior bypass 4. Carotid artery disease-prior endarterectomy 5. Obstructive sleep apnea -- unfortunately did not tolerate the mask well. 6. Hypertension-currently doing well. The patient reviewed. 7. Hyperlipidemia-continue with simvastatin  Signed, Donato Schultz, MD [facility]  04/04/2013 12:37 PM

## 2013-04-10 ENCOUNTER — Other Ambulatory Visit: Payer: Self-pay | Admitting: Cardiology

## 2013-04-24 ENCOUNTER — Telehealth: Payer: Self-pay | Admitting: Cardiology

## 2013-04-24 NOTE — Telephone Encounter (Signed)
Walk In pt Form " [employer]" Patient Assistance form Dropped off gave to [facility]

## 2013-05-01 NOTE — Telephone Encounter (Signed)
Follow up    Pt's daughter called for status on there paper work for [employer].  Aram Beecham.  # [phone]

## 2013-05-02 ENCOUNTER — Telehealth: Payer: Self-pay | Admitting: *Deleted

## 2013-05-02 NOTE — Telephone Encounter (Signed)
Message copied by Carmela Hurt on Tue May 02, 2013  2:06 PM ------      Message from: Carmela Hurt      Created: Tue May 02, 2013  2:03 PM      Regarding: call wife       Please call wife regarding this patient assistance form, she wants to know it was completed and faxed or mailed, I think this her second call. Thanks, Addison Lank, RN                  Cala Bradford D Mesiemore at 04/24/2013 11:41 AM         Status: Signed                 Walk In pt Form " [employer]" Patient Assistance form Dropped off gave to [facility]          ------

## 2013-05-09 ENCOUNTER — Ambulatory Visit (INDEPENDENT_AMBULATORY_CARE_PROVIDER_SITE_OTHER): Payer: Medicare Other | Admitting: Cardiology

## 2013-05-09 ENCOUNTER — Encounter: Payer: Self-pay | Admitting: Cardiology

## 2013-05-09 VITALS — BP 132/66 | HR 74 | Ht 68.0 in | Wt 164.0 lb

## 2013-05-09 DIAGNOSIS — Z7901 Long term (current) use of anticoagulants: Secondary | ICD-10-CM | POA: Diagnosis not present

## 2013-05-09 DIAGNOSIS — I4891 Unspecified atrial fibrillation: Secondary | ICD-10-CM

## 2013-05-09 DIAGNOSIS — I2510 Atherosclerotic heart disease of native coronary artery without angina pectoris: Secondary | ICD-10-CM | POA: Diagnosis not present

## 2013-05-09 DIAGNOSIS — E7800 Pure hypercholesterolemia, unspecified: Secondary | ICD-10-CM | POA: Diagnosis not present

## 2013-05-09 LAB — BASIC METABOLIC PANEL
BUN: 24 mg/dL — ABNORMAL HIGH (ref 6–23)
CHLORIDE: 102 meq/L (ref 96–112)
CO2: 29 meq/L (ref 19–32)
CREATININE: 1.5 mg/dL (ref 0.4–1.5)
Calcium: 9.7 mg/dL (ref 8.4–10.5)
GFR: 48.36 mL/min — ABNORMAL LOW (ref >60.00)
Glucose, Bld: 107 mg/dL — ABNORMAL HIGH (ref 70–99)
POTASSIUM: 4.1 meq/L (ref 3.5–5.1)
Sodium: 139 mEq/L (ref 135–145)

## 2013-05-09 LAB — CBC WITH DIFFERENTIAL/PLATELET
BASOS PCT: 0.1 % (ref 0.0–3.0)
Basophils Absolute: 0 10*3/uL (ref 0.0–0.1)
EOS ABS: 0.2 10*3/uL (ref 0.0–0.7)
EOS PCT: 2.7 % (ref 0.0–5.0)
HEMATOCRIT: 42.2 % (ref 39.0–52.0)
HEMOGLOBIN: 14.3 g/dL (ref 13.0–17.0)
LYMPHS ABS: 1.3 10*3/uL (ref 0.7–4.0)
Lymphocytes Relative: 17.7 % (ref 12.0–46.0)
MCHC: 33.9 g/dL (ref 30.0–36.0)
MCV: 93.8 fl (ref 78.0–100.0)
MONO ABS: 0.4 10*3/uL (ref 0.1–1.0)
Monocytes Relative: 6 % (ref 3.0–12.0)
NEUTROS ABS: 5.2 10*3/uL (ref 1.4–7.7)
NEUTROS PCT: 73.5 % (ref 43.0–77.0)
Platelets: 134 10*3/uL — ABNORMAL LOW (ref 150.0–400.0)
RBC: 4.5 Mil/uL (ref 4.22–5.81)
RDW: 13.1 % (ref 11.5–14.6)
WBC: 7.1 10*3/uL (ref 4.5–10.5)

## 2013-05-09 NOTE — Patient Instructions (Signed)
Your physician recommends that you continue on your current medications as directed. Please refer to the Current Medication list given to you today.  Your physician recommends that you have labs today: BMET, CBC  Your physician wants you to follow-up in: 6 months with Dr. Skains. You will receive a reminder letter in the mail two months in advance. If you don't receive a letter, please call our office to schedule the follow-up appointment.   

## 2013-05-09 NOTE — Progress Notes (Signed)
[place]. [address] [place], [place]  [address] Phone: [phone] Fax:  [phone]  Date:  05/09/2013   ID:  Jesus Beck, DOB 04-19-1926, MRN [id number]  PCP:  Kandice Hams, MD   History of Present Illness: Jesus Beck is a 80 y.o. male with coronary artery disease status post bypass in 1999 with hypertension, hyperlipidemia, OSA-noncompliant with mask, paroxysmal atrial fibrillation previously on sotalol.  His heart rate at one point was 44 with right bundle branch block. [employer]. Gave me a pen- that his brother made.  He is currently asymptomatic in relation to his atrial fibrillation. He has been doing well, no syncope, no strokelike symptoms. We discussed at length risk of stroke, anticoagulation needs. Discussed with pharmacy.  Occasionally he would get ankle edema. He was placed on HCTZ for that. He did develop a rash along his chest wall and this medication was stopped several months ago. He saw dermatologist for the rash as well. Unsure what was the ultimate cause for the rash. I've asked him to stop his HCTZ.  Eliquis low dose started early December 2015. Doing well. Only minor bruise on left hand. Box fell on him. Feels better.  Wt Readings from Last 3 Encounters:  05/09/13 164 lb (74.39 kg)  03/01/13 158 lb (71.668 kg)     Past Medical History  Diagnosis Date  . Hypercholesteremia   . HTN (hypertension)   . Back pain   . Atrial fibrillation     paroxysmal atrial fibrillation- stopped sotolol due to HR 44 (on 40mg  BID)  . Hypersomnia   . Dyslipidemia   . CAD (coronary artery disease)     s/p CABG 1999 with LIMA to MAD, SVG to first diagonal, SVG to RCA and PCI with DES mid LAD 06/09 and residual 70% D2 not amenable to PCI due to small caliber Cardiology Dr. Marlou Porch  . RBBB   . Carotid artery stenosis      s/p bialteral CEA's 2004 with persistent bruit on left and minimal plaque by carotid dopplers 2010  . Renal insufficiency     Past  Surgical History  Procedure Laterality Date  . Cholecystectomy    . Coronary artery bypass graft    . Hernia repair    . Intraocular lens insertion      Current Outpatient Prescriptions  Medication Sig Dispense Refill  . amLODipine (NORVASC) 5 MG tablet Take 5 mg by mouth daily.      Marland Kitchen apixaban (ELIQUIS) 2.5 MG TABS tablet Take 1 tablet (2.5 mg total) by mouth 2 (two) times daily.  60 tablet  3  . HYDROcodone-acetaminophen (NORCO/VICODIN) 5-325 MG per tablet Take 1 tablet by mouth every 6 (six) hours as needed for pain.      . isosorbide mononitrate (IMDUR) 60 MG 24 hr tablet Take 90 mg by mouth daily.       Marland Kitchen losartan (COZAAR) 100 MG tablet Take 100 mg by mouth daily.      . nitroGLYCERIN (NITROSTAT) 0.4 MG SL tablet Place 0.4 mg under the tongue every 5 (five) minutes as needed for chest pain.      . Omega-3 Fatty Acids (OMEGA 3 PO) Take by mouth daily.      . simvastatin (ZOCOR) 20 MG tablet TAKE 1 TABLET BY MOUTH AT BEDTIME  90 tablet  0  . traMADol (ULTRAM) 50 MG tablet Take 50 mg by mouth every 6 (six) hours as needed.  No current facility-administered medications for this visit.    Allergies:    Allergies  Allergen Reactions  . Lipitor [Atorvastatin]     Social History:  The patient  reports that he has never smoked. He does not have any smokeless tobacco history on file. He reports that he does not drink alcohol or use illicit drugs.   ROS:  Please see the history of present illness.   Denies any bleeding, syncope, orthopnea, PND. Low back pain. No syncope, no CP, no SOB.   PHYSICAL EXAM: VS:  BP 132/66  Pulse 74  Ht 5\' 8"  (1.727 m)  Wt 164 lb (74.39 kg)  BMI 24.94 kg/m2 Well nourished, well developed, in no acute distress HEENT: normal Neck: no JVD Cardiac:  irreg irreg, no murmur Lungs:  clear to auscultation bilaterally, no wheezing, rhonchi or rales Abd: soft, nontender, no hepatomegaly Ext: no edemaMinor bruise left hand Skin: warm and dry Neuro: no  focal abnormalities noted  EKG:  Atrial fibrillation heart rate 82 with right bundle branch block , left posterior fascicular block    ASSESSMENT AND PLAN:  1. Paroxysmal atrial fibrillation-was previously on sotalol. Atrial fibrillation currently noted on EKG. I had previous discussion with him about anticoagulation. He is at increased risk for stroke. Currently well rate controlled. 2. Chronic anticoagulation-newly started Eliquis 2.5 mg twice a day for about a month. Overall doing well. We will check basic metabolic profile and CBC. 3. Coronary artery disease-prior bypass 4. Carotid artery disease-prior endarterectomy 5. Obstructive sleep apnea -- unfortunately did not tolerate the mask well. 6. Hypertension-currently doing well. The patient reviewed. 7. Hyperlipidemia-continue with simvastatin 8. We will see back in 6 months  Signed, Candee Furbish, MD [facility]  05/09/2013 2:08 PM

## 2013-05-11 NOTE — Telephone Encounter (Signed)
Provided patient with completed paperwork at last office visit.

## 2013-05-29 DIAGNOSIS — J209 Acute bronchitis, unspecified: Secondary | ICD-10-CM | POA: Diagnosis not present

## 2013-06-02 ENCOUNTER — Other Ambulatory Visit: Payer: Self-pay | Admitting: *Deleted

## 2013-06-02 MED ORDER — APIXABAN 2.5 MG PO TABS: 2.5000 mg | ORAL_TABLET | Freq: Two times a day (BID) | ORAL | Status: DC

## 2013-06-05 ENCOUNTER — Telehealth: Payer: Self-pay | Admitting: *Deleted

## 2013-06-05 NOTE — Telephone Encounter (Signed)
Patient need PA for eliquis and done,  also completed patient assistance program from [facility] for eliquis, need sheet that Dr Marlou Porch signs plus script will get tomorrow and fax when MD is here

## 2013-06-06 NOTE — Telephone Encounter (Signed)
Dr Marlou Porch signed the physician part of form to [employer] for eliquis assistance, also will fax script signed by Dr Marlou Porch.

## 2013-06-08 ENCOUNTER — Telehealth: Payer: Self-pay | Admitting: *Deleted

## 2013-06-08 NOTE — Telephone Encounter (Signed)
Need new medicare part D insurer information to PA eliquis.

## 2013-06-12 NOTE — Telephone Encounter (Signed)
[employer]

## 2013-06-13 NOTE — Telephone Encounter (Signed)
eliquis approved dates 06/13/2013-06/13/2014 from [place]

## 2013-06-13 NOTE — Telephone Encounter (Signed)
Patient daughter provided me with new insurer information for medicare part d for this patient, Jesus Beck Prior Authorization Caren Griffins) ID # [id number] Phone [phone] Need PA for eliquis

## 2013-06-14 ENCOUNTER — Other Ambulatory Visit: Payer: Self-pay | Admitting: Internal Medicine

## 2013-06-14 ENCOUNTER — Ambulatory Visit
Admission: RE | Admit: 2013-06-14 | Discharge: 2013-06-14 | Disposition: A | Discharge status: Home / Self Care | Payer: Medicare Other | Source: Ambulatory Visit | Attending: Internal Medicine | Admitting: Internal Medicine

## 2013-06-14 DIAGNOSIS — S8990XA Unspecified injury of unspecified lower leg, initial encounter: Secondary | ICD-10-CM | POA: Diagnosis not present

## 2013-06-14 DIAGNOSIS — M25569 Pain in unspecified knee: Secondary | ICD-10-CM | POA: Diagnosis not present

## 2013-06-14 DIAGNOSIS — W1809XA Striking against other object with subsequent fall, initial encounter: Secondary | ICD-10-CM | POA: Diagnosis not present

## 2013-06-14 DIAGNOSIS — L259 Unspecified contact dermatitis, unspecified cause: Secondary | ICD-10-CM | POA: Diagnosis not present

## 2013-06-14 DIAGNOSIS — S8000XA Contusion of unspecified knee, initial encounter: Secondary | ICD-10-CM | POA: Diagnosis not present

## 2013-06-14 DIAGNOSIS — S99929A Unspecified injury of unspecified foot, initial encounter: Secondary | ICD-10-CM | POA: Diagnosis not present

## 2013-06-14 DIAGNOSIS — M7989 Other specified soft tissue disorders: Secondary | ICD-10-CM | POA: Diagnosis not present

## 2013-06-22 ENCOUNTER — Other Ambulatory Visit: Payer: Self-pay | Admitting: Cardiology

## 2013-07-05 ENCOUNTER — Other Ambulatory Visit: Payer: Self-pay | Admitting: Cardiology

## 2013-07-20 ENCOUNTER — Other Ambulatory Visit: Payer: Self-pay | Admitting: *Deleted

## 2013-07-20 MED ORDER — LOSARTAN POTASSIUM 100 MG PO TABS: 100.0000 mg | ORAL_TABLET | Freq: Every day | ORAL | Status: DC

## 2013-09-04 ENCOUNTER — Telehealth: Payer: Self-pay | Admitting: *Deleted

## 2013-09-04 NOTE — Telephone Encounter (Signed)
[employer] approved patient assistance program for ELIQUIS through 05/03/2014 Daughter Caren Griffins notified by voicemail.

## 2013-09-05 DIAGNOSIS — H43819 Vitreous degeneration, unspecified eye: Secondary | ICD-10-CM | POA: Diagnosis not present

## 2013-09-05 DIAGNOSIS — H52229 Regular astigmatism, unspecified eye: Secondary | ICD-10-CM | POA: Diagnosis not present

## 2013-09-05 DIAGNOSIS — H5210 Myopia, unspecified eye: Secondary | ICD-10-CM | POA: Diagnosis not present

## 2013-09-05 DIAGNOSIS — H524 Presbyopia: Secondary | ICD-10-CM | POA: Diagnosis not present

## 2013-09-13 ENCOUNTER — Other Ambulatory Visit: Payer: Self-pay | Admitting: Cardiology

## 2013-09-13 ENCOUNTER — Other Ambulatory Visit: Payer: Self-pay

## 2013-09-13 MED ORDER — ISOSORBIDE MONONITRATE ER 60 MG PO TB24: 90.0000 mg | ORAL_TABLET | Freq: Every day | ORAL | Status: DC

## 2013-09-20 DIAGNOSIS — E7800 Pure hypercholesterolemia, unspecified: Secondary | ICD-10-CM | POA: Diagnosis not present

## 2013-09-20 DIAGNOSIS — M545 Low back pain, unspecified: Secondary | ICD-10-CM | POA: Diagnosis not present

## 2013-09-20 DIAGNOSIS — Z23 Encounter for immunization: Secondary | ICD-10-CM | POA: Diagnosis not present

## 2013-09-20 DIAGNOSIS — I10 Essential (primary) hypertension: Secondary | ICD-10-CM | POA: Diagnosis not present

## 2013-09-20 DIAGNOSIS — N183 Chronic kidney disease, stage 3 unspecified: Secondary | ICD-10-CM | POA: Diagnosis not present

## 2013-09-20 DIAGNOSIS — Z0000 Encounter for general adult medical examination without abnormal findings: Secondary | ICD-10-CM | POA: Diagnosis not present

## 2013-11-16 ENCOUNTER — Ambulatory Visit (INDEPENDENT_AMBULATORY_CARE_PROVIDER_SITE_OTHER): Payer: Medicare Other | Admitting: Cardiology

## 2013-11-16 ENCOUNTER — Encounter: Payer: Self-pay | Admitting: Cardiology

## 2013-11-16 VITALS — BP 140/76 | HR 60 | Ht 69.0 in | Wt 157.0 lb

## 2013-11-16 DIAGNOSIS — I6529 Occlusion and stenosis of unspecified carotid artery: Secondary | ICD-10-CM

## 2013-11-16 DIAGNOSIS — I10 Essential (primary) hypertension: Secondary | ICD-10-CM | POA: Diagnosis not present

## 2013-11-16 DIAGNOSIS — I6523 Occlusion and stenosis of bilateral carotid arteries: Secondary | ICD-10-CM

## 2013-11-16 DIAGNOSIS — E7800 Pure hypercholesterolemia, unspecified: Secondary | ICD-10-CM

## 2013-11-16 DIAGNOSIS — I2510 Atherosclerotic heart disease of native coronary artery without angina pectoris: Secondary | ICD-10-CM | POA: Diagnosis not present

## 2013-11-16 DIAGNOSIS — I480 Paroxysmal atrial fibrillation: Secondary | ICD-10-CM

## 2013-11-16 DIAGNOSIS — I4891 Unspecified atrial fibrillation: Secondary | ICD-10-CM

## 2013-11-16 DIAGNOSIS — I658 Occlusion and stenosis of other precerebral arteries: Secondary | ICD-10-CM

## 2013-11-16 NOTE — Patient Instructions (Signed)
The current medical regimen is effective;  continue present plan and medications.  Follow up in 6 months with Dr. Skains.  You will receive a letter in the mail 2 months before you are due.  Please call us when you receive this letter to schedule your follow up appointment.  

## 2013-11-16 NOTE — Progress Notes (Signed)
[place]. [address], [address] [place], [place]  [address] Phone: [phone] Fax:  [phone]  Date:  11/16/2013   ID:  Jesus Beck, DOB 1926/01/14, MRN [id number]  PCP:  Kandice Hams, MD   History of Present Illness: Jesus Beck is a 80 y.o. male with coronary artery disease status post bypass in 1999 with hypertension, hyperlipidemia, OSA-noncompliant with mask, paroxysmal atrial fibrillation previously on sotalol.  His heart rate at one point was 44 with right bundle branch block. [employer]. Gave me a pen- that his brother made.  He is currently asymptomatic in relation to his atrial fibrillation. He has been doing well, no syncope, no strokelike symptoms. We discussed at length risk of stroke, anticoagulation needs. Discussed with pharmacy.  Occasionally he would get ankle edema. He was placed on HCTZ for that. He did develop a rash along his chest wall and this medication was stopped several months ago. He saw dermatologist for the rash as well. Unsure what was the ultimate cause for the rash. I've asked him to stop his HCTZ.  Eliquis low dose started early December 2015. Doing well. Only minor bruise on left hand. Box fell on him. Feels better.  Wt Readings from Last 3 Encounters:  11/16/13 157 lb (71.215 kg)  05/09/13 164 lb (74.39 kg)  03/01/13 158 lb (71.668 kg)     Past Medical History  Diagnosis Date  . Hypercholesteremia   . HTN (hypertension)   . Back pain   . Atrial fibrillation     paroxysmal atrial fibrillation- stopped sotolol due to HR 44 (on 40mg  BID)  . Hypersomnia   . Dyslipidemia   . CAD (coronary artery disease)     s/p CABG 1999 with LIMA to MAD, SVG to first diagonal, SVG to RCA and PCI with DES mid LAD 06/09 and residual 70% D2 not amenable to PCI due to small caliber Cardiology Dr. Marlou Porch  . RBBB   . Carotid artery stenosis      s/p bialteral CEA's 2004 with persistent bruit on left and minimal plaque by carotid dopplers 2010  .  Renal insufficiency     Past Surgical History  Procedure Laterality Date  . Cholecystectomy    . Coronary artery bypass graft    . Hernia repair    . Intraocular lens insertion      Current Outpatient Prescriptions  Medication Sig Dispense Refill  . amLODipine (NORVASC) 5 MG tablet TAKE 1 TABLET BY MOUTH EVERY DAY  90 tablet  1  . ELIQUIS 2.5 MG TABS tablet TAKE 1 TABLET BY MOUTH TWICE DAILY  60 tablet  3  . HYDROcodone-acetaminophen (NORCO/VICODIN) 5-325 MG per tablet Take 1 tablet by mouth every 6 (six) hours as needed for pain.      . isosorbide mononitrate (IMDUR) 60 MG 24 hr tablet TAKE 1.5 TABLET BY MOUTH DAILY  135 tablet  0  . losartan (COZAAR) 100 MG tablet Take 1 tablet (100 mg total) by mouth daily.  90 tablet  1  . nitroGLYCERIN (NITROSTAT) 0.4 MG SL tablet Place 0.4 mg under the tongue every 5 (five) minutes as needed for chest pain.      . Omega-3 Fatty Acids (OMEGA 3 PO) Take by mouth daily.      . simvastatin (ZOCOR) 20 MG tablet TAKE 1 TABLET BY MOUTH EVERY NIGHT AT BEDTIME  90 tablet  0  . traMADol (ULTRAM) 50 MG tablet Take 50 mg by mouth  every 6 (six) hours as needed.        No current facility-administered medications for this visit.    Allergies:    Allergies  Allergen Reactions  . Lipitor [Atorvastatin]     Social History:  The patient  reports that he has never smoked. He does not have any smokeless tobacco history on file. He reports that he does not drink alcohol or use illicit drugs.   ROS:  Please see the history of present illness.   Denies any bleeding, syncope, orthopnea, PND. Low back pain. No syncope, no CP, no SOB.   PHYSICAL EXAM: VS:  BP 140/76  Pulse 60  Ht 5\' 9"  (1.753 m)  Wt 157 lb (71.215 kg)  BMI 23.17 kg/m2 Well nourished, well developed, in no acute distress HEENT: normal Neck: no JVD Cardiac:  irreg irreg, no murmur Lungs:  clear to auscultation bilaterally, no wheezing, rhonchi or rales Abd: soft, nontender, no  hepatomegaly Ext: no edemaMinor bruise left hand Skin: warm and dry Neuro: no focal abnormalities noted  EKG:  Atrial fibrillation heart rate 82 with right bundle branch block , left posterior fascicular block    ASSESSMENT AND PLAN:  1. Paroxysmal atrial fibrillation-was previously on sotalol. Atrial fibrillation noted on prior EKG. Currently well rate controlled. 2. Chronic anticoagulation- Eliquis 2.5 mg twice a day for about a month. Lovett Sox helped with assistance on cost. Overall doing well. Dr. Delfina Redwood recently checked blood work.  3. Coronary artery disease-prior bypass 4. Carotid artery disease-prior endarterectomy 5. Obstructive sleep apnea -- unfortunately did not tolerate the mask well. 6. Hypertension-currently doing well.(minimally elevated). Continue with current meds.  7. Hyperlipidemia-continue with simvastatin 8. We will see back in 6 months  Signed, Candee Furbish, MD [facility]  11/16/2013 1:53 PM

## 2013-12-14 ENCOUNTER — Other Ambulatory Visit: Payer: Self-pay | Admitting: Cardiology

## 2014-01-09 ENCOUNTER — Other Ambulatory Visit: Payer: Self-pay | Admitting: *Deleted

## 2014-01-09 MED ORDER — ISOSORBIDE MONONITRATE ER 60 MG PO TB24: ORAL_TABLET | ORAL | Status: DC

## 2014-01-13 ENCOUNTER — Other Ambulatory Visit: Payer: Self-pay | Admitting: Cardiology

## 2014-01-29 ENCOUNTER — Other Ambulatory Visit: Payer: Medicare Other

## 2014-02-06 DIAGNOSIS — Z23 Encounter for immunization: Secondary | ICD-10-CM | POA: Diagnosis not present

## 2014-03-25 DIAGNOSIS — B351 Tinea unguium: Secondary | ICD-10-CM | POA: Diagnosis not present

## 2014-03-25 DIAGNOSIS — L03116 Cellulitis of left lower limb: Secondary | ICD-10-CM | POA: Diagnosis not present

## 2014-03-28 DIAGNOSIS — R21 Rash and other nonspecific skin eruption: Secondary | ICD-10-CM | POA: Diagnosis not present

## 2014-03-28 DIAGNOSIS — I10 Essential (primary) hypertension: Secondary | ICD-10-CM | POA: Diagnosis not present

## 2014-04-04 ENCOUNTER — Other Ambulatory Visit: Payer: Self-pay | Admitting: Cardiology

## 2014-04-09 ENCOUNTER — Telehealth: Payer: Self-pay | Admitting: Cardiology

## 2014-04-09 NOTE — Telephone Encounter (Signed)
Walk in Pt Form " [employer]" form Dropped Off Gave to [employer]KM

## 2014-04-09 NOTE — Telephone Encounter (Signed)
Correction: [employer]ps paper Gave to [place] In Refill Room.[place]

## 2014-04-11 ENCOUNTER — Telehealth: Payer: Self-pay | Admitting: *Deleted

## 2014-04-11 ENCOUNTER — Other Ambulatory Visit: Payer: Self-pay | Admitting: Cardiology

## 2014-04-11 NOTE — Telephone Encounter (Signed)
Patient Assistance Application form and new prescription faxed to [employer] for Eliquis.

## 2014-05-15 ENCOUNTER — Encounter: Payer: Self-pay | Admitting: Cardiology

## 2014-05-15 ENCOUNTER — Ambulatory Visit (INDEPENDENT_AMBULATORY_CARE_PROVIDER_SITE_OTHER): Payer: Medicare Other | Admitting: Cardiology

## 2014-05-15 VITALS — BP 130/72 | HR 87 | Ht 69.0 in | Wt 163.0 lb

## 2014-05-15 DIAGNOSIS — E7800 Pure hypercholesterolemia, unspecified: Secondary | ICD-10-CM

## 2014-05-15 DIAGNOSIS — I480 Paroxysmal atrial fibrillation: Secondary | ICD-10-CM

## 2014-05-15 DIAGNOSIS — I2510 Atherosclerotic heart disease of native coronary artery without angina pectoris: Secondary | ICD-10-CM

## 2014-05-15 DIAGNOSIS — I4510 Unspecified right bundle-branch block: Secondary | ICD-10-CM | POA: Diagnosis not present

## 2014-05-15 DIAGNOSIS — I2583 Coronary atherosclerosis due to lipid rich plaque: Secondary | ICD-10-CM

## 2014-05-15 NOTE — Patient Instructions (Addendum)
The current medical regimen is effective;  continue present plan and medications.  Follow up in 6 months with Dr. Skains.  You will receive a letter in the mail 2 months before you are due.  Please call us when you receive this letter to schedule your follow up appointment.  Thank you for choosing [facility]!!     

## 2014-05-15 NOTE — Progress Notes (Signed)
Jesus Beck. [address] [place], [place]  [address] Phone: [phone] Fax:  [phone]  Date:  05/15/2014   ID:  Jesus Beck, DOB 1925-08-04, MRN [id number]  PCP:  Jesus Hams, MD   History of Present Illness: Jesus Beck is a 81 y.o. male with coronary artery disease status post bypass in 1999 with hypertension, hyperlipidemia, OSA-noncompliant with mask, paroxysmal atrial fibrillation previously on sotalol.  His heart rate at one point was 44 with right bundle branch block. [employer]. Gave me a pen- that his brother made.  He is currently asymptomatic in relation to his atrial fibrillation. He has been doing well, no syncope, no strokelike symptoms. We discussed at length risk of stroke, anticoagulation needs. Discussed with pharmacy.  Occasionally he would get ankle edema. He was placed on HCTZ for that. He did develop a rash along his chest wall and this medication was stopped several months ago. He saw dermatologist for the rash as well. Unsure what was the ultimate cause for the rash. I've asked him to stop his HCTZ.  Eliquis low dose started early December 2015. Doing well. Feels well.   Wt Readings from Last 3 Encounters:  05/15/14 163 lb (73.936 kg)  11/16/13 157 lb (71.215 kg)  05/09/13 164 lb (74.39 kg)     Past Medical History  Diagnosis Date  . Hypercholesteremia   . HTN (hypertension)   . Back pain   . Atrial fibrillation     paroxysmal atrial fibrillation- stopped sotolol due to HR 44 (on 40mg  BID)  . Hypersomnia   . Dyslipidemia   . CAD (coronary artery disease)     s/p CABG 1999 with LIMA to MAD, SVG to first diagonal, SVG to RCA and PCI with DES mid LAD 06/09 and residual 70% D2 not amenable to PCI due to small caliber Cardiology Dr. Marlou Porch  . RBBB   . Carotid artery stenosis      s/p bialteral CEA's 2004 with persistent bruit on left and minimal plaque by carotid dopplers 2010  . Renal insufficiency     Past Surgical History    Procedure Laterality Date  . Cholecystectomy    . Coronary artery bypass graft    . Hernia repair    . Intraocular lens insertion      Current Outpatient Prescriptions  Medication Sig Dispense Refill  . amLODipine (NORVASC) 5 MG tablet TAKE 1 TABLET BY MOUTH EVERY DAY. 90 tablet 1  . ELIQUIS 2.5 MG TABS tablet TAKE 1 TABLET BY MOUTH TWICE DAILY 60 tablet 3  . HYDROcodone-acetaminophen (NORCO/VICODIN) 5-325 MG per tablet Take 1 tablet by mouth every 6 (six) hours as needed for pain.    . isosorbide mononitrate (IMDUR) 60 MG 24 hr tablet TAKE 1.5 TABLETS BY MOUTH EVERY DAY. 135 tablet 0  . losartan (COZAAR) 100 MG tablet TAKE 1 TABLET BY MOUTH EVERY DAY 90 tablet 1  . nitroGLYCERIN (NITROSTAT) 0.4 MG SL tablet Place 0.4 mg under the tongue every 5 (five) minutes as needed for chest pain.    . Omega-3 Fatty Acids (OMEGA 3 PO) Take by mouth daily.    . simvastatin (ZOCOR) 20 MG tablet TAKE 1 TABLET BY MOUTH EVERY NIGHT AT BEDTIME 90 tablet 0  . traMADol (ULTRAM) 50 MG tablet Take 50 mg by mouth every 6 (six) hours as needed.      No current facility-administered medications for this visit.    Allergies:  Allergies  Allergen Reactions  . Lipitor [Atorvastatin]     Social History:  The patient  reports that he has never smoked. He does not have any smokeless tobacco history on file. He reports that he does not drink alcohol or use illicit drugs.   ROS:  Please see the history of present illness.   Denies any bleeding, syncope, orthopnea, PND. Low back pain. No syncope, no CP, no SOB.   PHYSICAL EXAM: VS:  BP 130/72 mmHg  Pulse 87  Ht 5\' 9"  (1.753 m)  Wt 163 lb (73.936 kg)  BMI 24.06 kg/m2 Well nourished, well developed, in no acute distress HEENT: normal Neck: no JVD Cardiac:  irreg irreg, no murmur Lungs:  clear to auscultation bilaterally, no wheezing, rhonchi or rales Abd: soft, nontender, no hepatomegaly Ext: trace edema, compression hose Skin: warm and dry Neuro: no  focal abnormalities noted  EKG:  Atrial fibrillation heart rate 87 with right bundle branch block , left posterior fascicular block    ASSESSMENT AND PLAN:  1. Paroxysmal atrial fibrillation-was previously on sotalol. Atrial fibrillation noted on prior EKG. Currently well rate controlled. 2. Chronic anticoagulation- Eliquis 2.5 mg twice a day for about a month. Lovett Sox helped with assistance on cost. Overall doing well. Dr. Delfina Redwood recently checked blood work. Since he has been taking it, feels better.  3. Coronary artery disease-prior bypass 4. Carotid artery disease-prior endarterectomy 5. Obstructive sleep apnea -- unfortunately did not tolerate the mask well. 6. Hypertension-currently doing well.(minimally elevated). Continue with current meds.  7. Hyperlipidemia-continue with simvastatin 8. We will see back in 6 months  Signed, Candee Furbish, MD [facility]  05/15/2014 3:30 PM

## 2014-05-22 ENCOUNTER — Encounter: Payer: Self-pay | Admitting: Cardiology

## 2014-06-13 ENCOUNTER — Other Ambulatory Visit: Payer: Self-pay | Admitting: Cardiology

## 2014-06-19 ENCOUNTER — Telehealth: Payer: Self-pay

## 2014-06-19 NOTE — Telephone Encounter (Signed)
Patient 's daughter call to get samples of eliquis until he can get his from the company I placed 2 weeks of samples at the front desk

## 2014-06-26 ENCOUNTER — Other Ambulatory Visit: Payer: Self-pay | Admitting: *Deleted

## 2014-06-26 MED ORDER — APIXABAN 2.5 MG PO TABS: 2.5000 mg | ORAL_TABLET | Freq: Two times a day (BID) | ORAL | Status: DC

## 2014-07-10 ENCOUNTER — Other Ambulatory Visit: Payer: Self-pay | Admitting: Cardiology

## 2014-08-27 ENCOUNTER — Inpatient Hospital Stay (HOSPITAL_COMMUNITY)
Admission: EM | Admit: 2014-08-27 | Discharge: 2014-08-29 | DRG: 242 | Disposition: A | Discharge status: Home / Self Care | Payer: Medicare Other | Attending: Internal Medicine | Admitting: Internal Medicine

## 2014-08-27 ENCOUNTER — Encounter (HOSPITAL_COMMUNITY)
Admission: EM | Disposition: A | Discharge status: Home / Self Care | Payer: Self-pay | Source: Home / Self Care | Attending: Internal Medicine

## 2014-08-27 ENCOUNTER — Encounter (HOSPITAL_COMMUNITY): Payer: Self-pay | Admitting: Emergency Medicine

## 2014-08-27 ENCOUNTER — Emergency Department (HOSPITAL_COMMUNITY): Payer: Medicare Other

## 2014-08-27 ENCOUNTER — Other Ambulatory Visit (HOSPITAL_COMMUNITY): Payer: Self-pay

## 2014-08-27 DIAGNOSIS — Z7901 Long term (current) use of anticoagulants: Secondary | ICD-10-CM

## 2014-08-27 DIAGNOSIS — J9811 Atelectasis: Secondary | ICD-10-CM | POA: Diagnosis present

## 2014-08-27 DIAGNOSIS — R001 Bradycardia, unspecified: Secondary | ICD-10-CM | POA: Diagnosis present

## 2014-08-27 DIAGNOSIS — I480 Paroxysmal atrial fibrillation: Secondary | ICD-10-CM | POA: Diagnosis not present

## 2014-08-27 DIAGNOSIS — E780 Pure hypercholesterolemia: Secondary | ICD-10-CM | POA: Diagnosis present

## 2014-08-27 DIAGNOSIS — I2510 Atherosclerotic heart disease of native coronary artery without angina pectoris: Secondary | ICD-10-CM | POA: Diagnosis not present

## 2014-08-27 DIAGNOSIS — Z950 Presence of cardiac pacemaker: Secondary | ICD-10-CM

## 2014-08-27 DIAGNOSIS — I442 Atrioventricular block, complete: Principal | ICD-10-CM | POA: Diagnosis present

## 2014-08-27 DIAGNOSIS — R918 Other nonspecific abnormal finding of lung field: Secondary | ICD-10-CM | POA: Diagnosis not present

## 2014-08-27 DIAGNOSIS — I462 Cardiac arrest due to underlying cardiac condition: Secondary | ICD-10-CM | POA: Diagnosis present

## 2014-08-27 DIAGNOSIS — I4891 Unspecified atrial fibrillation: Secondary | ICD-10-CM | POA: Diagnosis present

## 2014-08-27 DIAGNOSIS — I482 Chronic atrial fibrillation: Secondary | ICD-10-CM | POA: Diagnosis present

## 2014-08-27 DIAGNOSIS — I509 Heart failure, unspecified: Secondary | ICD-10-CM | POA: Diagnosis present

## 2014-08-27 DIAGNOSIS — Z951 Presence of aortocoronary bypass graft: Secondary | ICD-10-CM | POA: Diagnosis not present

## 2014-08-27 DIAGNOSIS — Z888 Allergy status to other drugs, medicaments and biological substances status: Secondary | ICD-10-CM | POA: Diagnosis not present

## 2014-08-27 DIAGNOSIS — I10 Essential (primary) hypertension: Secondary | ICD-10-CM | POA: Diagnosis present

## 2014-08-27 DIAGNOSIS — Z48812 Encounter for surgical aftercare following surgery on the circulatory system: Secondary | ICD-10-CM | POA: Diagnosis not present

## 2014-08-27 DIAGNOSIS — R109 Unspecified abdominal pain: Secondary | ICD-10-CM | POA: Diagnosis not present

## 2014-08-27 DIAGNOSIS — G4733 Obstructive sleep apnea (adult) (pediatric): Secondary | ICD-10-CM | POA: Diagnosis present

## 2014-08-27 DIAGNOSIS — I4510 Unspecified right bundle-branch block: Secondary | ICD-10-CM | POA: Diagnosis present

## 2014-08-27 DIAGNOSIS — R42 Dizziness and giddiness: Secondary | ICD-10-CM | POA: Diagnosis not present

## 2014-08-27 HISTORY — PX: EP IMPLANTABLE DEVICE: SHX172B

## 2014-08-27 HISTORY — PX: PERMANENT PACEMAKER INSERTION: SHX5480

## 2014-08-27 LAB — CBC
HCT: 41 % (ref 39.0–52.0)
HEMOGLOBIN: 14 g/dL (ref 13.0–17.0)
MCH: 31.7 pg (ref 26.0–34.0)
MCHC: 34.1 g/dL (ref 30.0–36.0)
MCV: 92.8 fL (ref 78.0–100.0)
Platelets: 147 10*3/uL — ABNORMAL LOW (ref 150–400)
RBC: 4.42 MIL/uL (ref 4.22–5.81)
RDW: 13.2 % (ref 11.5–15.5)
WBC: 8.4 10*3/uL (ref 4.0–10.5)

## 2014-08-27 LAB — PROTIME-INR
INR: 1.19 (ref 0.00–1.49)
Prothrombin Time: 15.2 seconds (ref 11.6–15.2)

## 2014-08-27 LAB — COMPREHENSIVE METABOLIC PANEL
ALBUMIN: 3.7 g/dL (ref 3.5–5.2)
ALK PHOS: 56 U/L (ref 39–117)
ALT: 32 U/L (ref 0–53)
AST: 40 U/L — AB (ref 0–37)
Anion gap: 15 (ref 5–15)
BUN: 32 mg/dL — AB (ref 6–23)
CALCIUM: 9.6 mg/dL (ref 8.4–10.5)
CHLORIDE: 100 mmol/L (ref 96–112)
CO2: 23 mmol/L (ref 19–32)
Creatinine, Ser: 2.08 mg/dL — ABNORMAL HIGH (ref 0.50–1.35)
GFR calc non Af Amer: 27 mL/min — ABNORMAL LOW (ref >90)
GFR, EST AFRICAN AMERICAN: 32 mL/min — AB (ref >90)
GLUCOSE: 159 mg/dL — AB (ref 70–99)
POTASSIUM: 4 mmol/L (ref 3.5–5.1)
SODIUM: 138 mmol/L (ref 135–145)
Total Bilirubin: 1.6 mg/dL — ABNORMAL HIGH (ref 0.3–1.2)
Total Protein: 6.3 g/dL (ref 6.0–8.3)

## 2014-08-27 LAB — I-STAT CHEM 8, ED
BUN: 34 mg/dL — ABNORMAL HIGH (ref 6–23)
CHLORIDE: 104 mmol/L (ref 96–112)
Calcium, Ion: 1.15 mmol/L (ref 1.13–1.30)
Creatinine, Ser: 1.9 mg/dL — ABNORMAL HIGH (ref 0.50–1.35)
Glucose, Bld: 160 mg/dL — ABNORMAL HIGH (ref 70–99)
HCT: 43 % (ref 39.0–52.0)
Hemoglobin: 14.6 g/dL (ref 13.0–17.0)
Potassium: 4 mmol/L (ref 3.5–5.1)
Sodium: 139 mmol/L (ref 135–145)
TCO2: 21 mmol/L (ref 0–100)

## 2014-08-27 LAB — MRSA PCR SCREENING: MRSA by PCR: NEGATIVE

## 2014-08-27 LAB — TSH: TSH: 1.856 u[IU]/mL (ref 0.350–4.500)

## 2014-08-27 LAB — I-STAT TROPONIN, ED: TROPONIN I, POC: 0.02 ng/mL (ref 0.00–0.08)

## 2014-08-27 SURGERY — PERMANENT PACEMAKER INSERTION
Anesthesia: LOCAL

## 2014-08-27 SURGERY — PERMANENT PACEMAKER INSERTION

## 2014-08-27 MED ORDER — ATROPINE SULFATE 0.1 MG/ML IJ SOLN
INTRAMUSCULAR | Status: AC
  Filled 2014-08-27: qty 10

## 2014-08-27 MED ORDER — DOPAMINE-DEXTROSE 3.2-5 MG/ML-% IV SOLN
0.0000 ug/kg/min | Freq: Once | INTRAVENOUS | Status: AC
  Administered 2014-08-27: 5 ug/kg/min via INTRAVENOUS

## 2014-08-27 MED ORDER — CEFAZOLIN SODIUM-DEXTROSE 2-3 GM-% IV SOLR
2.0000 g | INTRAVENOUS | Status: DC
Start: 2014-08-27 — End: 2014-08-27

## 2014-08-27 MED ORDER — ALPRAZOLAM 0.25 MG PO TABS: 0.2500 mg | ORAL_TABLET | Freq: Two times a day (BID) | ORAL | Status: DC | PRN

## 2014-08-27 MED ORDER — ZOLPIDEM TARTRATE 5 MG PO TABS: 5.0000 mg | ORAL_TABLET | Freq: Every evening | ORAL | Status: DC | PRN

## 2014-08-27 MED ORDER — SODIUM CHLORIDE 0.9 % IR SOLN: 80.0000 mg | Status: DC

## 2014-08-27 MED ORDER — ISOSORBIDE MONONITRATE ER 60 MG PO TB24
90.0000 mg | ORAL_TABLET | Freq: Every day | ORAL | Status: DC
  Administered 2014-08-28 – 2014-08-29 (×2): 90 mg via ORAL
  Filled 2014-08-27 (×4): qty 1

## 2014-08-27 MED ORDER — ACETAMINOPHEN 325 MG PO TABS: 650.0000 mg | ORAL_TABLET | ORAL | Status: DC | PRN

## 2014-08-27 MED ORDER — ONDANSETRON HCL 4 MG/2ML IJ SOLN
INTRAMUSCULAR | Status: AC
  Filled 2014-08-27: qty 2

## 2014-08-27 MED ORDER — TRAMADOL HCL 50 MG PO TABS: 50.0000 mg | ORAL_TABLET | Freq: Four times a day (QID) | ORAL | Status: DC | PRN

## 2014-08-27 MED ORDER — SODIUM CHLORIDE 0.9 % IJ SOLN: 3.0000 mL | INTRAMUSCULAR | Status: DC | PRN

## 2014-08-27 MED ORDER — LIDOCAINE HCL (PF) 1 % IJ SOLN
INTRAMUSCULAR | Status: AC
  Filled 2014-08-27: qty 60

## 2014-08-27 MED ORDER — ISOSORBIDE MONONITRATE ER 30 MG PO TB24: 90.0000 mg | ORAL_TABLET | Freq: Every day | ORAL | Status: DC

## 2014-08-27 MED ORDER — NITROGLYCERIN 1 MG/10 ML FOR IR/CATH LAB
INTRA_ARTERIAL | Status: AC
  Filled 2014-08-27: qty 10

## 2014-08-27 MED ORDER — ENOXAPARIN SODIUM 40 MG/0.4ML ~~LOC~~ SOLN: 40.0000 mg | SUBCUTANEOUS | Status: DC

## 2014-08-27 MED ORDER — SIMVASTATIN 20 MG PO TABS: 20.0000 mg | ORAL_TABLET | Freq: Every day | ORAL | Status: DC

## 2014-08-27 MED ORDER — APIXABAN 2.5 MG PO TABS: 2.5000 mg | ORAL_TABLET | Freq: Two times a day (BID) | ORAL | Status: DC

## 2014-08-27 MED ORDER — ONDANSETRON HCL 4 MG/2ML IJ SOLN: 4.0000 mg | Freq: Four times a day (QID) | INTRAMUSCULAR | Status: DC | PRN

## 2014-08-27 MED ORDER — DOPAMINE-DEXTROSE 1.6-5 MG/ML-% IV SOLN: 2.0000 ug/kg/min | Freq: Once | INTRAVENOUS | Status: DC

## 2014-08-27 MED ORDER — SODIUM CHLORIDE 0.9 % IV SOLN: INTRAVENOUS | Status: DC

## 2014-08-27 MED ORDER — FENTANYL CITRATE (PF) 100 MCG/2ML IJ SOLN
INTRAMUSCULAR | Status: AC
  Filled 2014-08-27: qty 2

## 2014-08-27 MED ORDER — MIDAZOLAM HCL 2 MG/2ML IJ SOLN
INTRAMUSCULAR | Status: AC
  Filled 2014-08-27: qty 2

## 2014-08-27 MED ORDER — ACETAMINOPHEN 325 MG PO TABS: 325.0000 mg | ORAL_TABLET | ORAL | Status: DC | PRN

## 2014-08-27 MED ORDER — ATROPINE SULFATE 1 MG/ML IJ SOLN
1.0000 mg | Freq: Once | INTRAMUSCULAR | Status: AC
  Administered 2014-08-27: 1 mg via INTRAVENOUS

## 2014-08-27 MED ORDER — CEFAZOLIN SODIUM 1-5 GM-% IV SOLN
1.0000 g | Freq: Two times a day (BID) | INTRAVENOUS | Status: AC
  Administered 2014-08-27 – 2014-08-28 (×2): 1 g via INTRAVENOUS
  Filled 2014-08-27 (×2): qty 50

## 2014-08-27 MED ORDER — SODIUM CHLORIDE 0.9 % IV SOLN: 250.0000 mL | INTRAVENOUS | Status: DC

## 2014-08-27 MED ORDER — ENOXAPARIN SODIUM 30 MG/0.3ML ~~LOC~~ SOLN
30.0000 mg | SUBCUTANEOUS | Status: DC
  Administered 2014-08-28: 30 mg via SUBCUTANEOUS
  Filled 2014-08-27 (×2): qty 0.3

## 2014-08-27 MED ORDER — HYDROCODONE-ACETAMINOPHEN 5-325 MG PO TABS
1.0000 | ORAL_TABLET | Freq: Four times a day (QID) | ORAL | Status: DC | PRN
  Administered 2014-08-27: 1 via ORAL
  Filled 2014-08-27: qty 1

## 2014-08-27 MED ORDER — HEPARIN (PORCINE) IN NACL 2-0.9 UNIT/ML-% IJ SOLN
INTRAMUSCULAR | Status: AC
  Filled 2014-08-27: qty 1000

## 2014-08-27 MED ORDER — YOU HAVE A PACEMAKER BOOK
Freq: Once | Status: AC
  Administered 2014-08-27: 1
  Filled 2014-08-27: qty 1

## 2014-08-27 MED ORDER — LIDOCAINE HCL (PF) 1 % IJ SOLN
INTRAMUSCULAR | Status: AC
  Filled 2014-08-27: qty 30

## 2014-08-27 MED ORDER — SODIUM CHLORIDE 0.9 % IJ SOLN
3.0000 mL | Freq: Two times a day (BID) | INTRAMUSCULAR | Status: DC
Start: 2014-08-27 — End: 2014-08-28
  Administered 2014-08-27: 3 mL via INTRAVENOUS

## 2014-08-27 MED ORDER — SODIUM CHLORIDE 0.9 % IR SOLN
Freq: Once | Status: DC
  Filled 2014-08-27: qty 2

## 2014-08-27 MED ORDER — CEFAZOLIN SODIUM-DEXTROSE 2-3 GM-% IV SOLR
INTRAVENOUS | Status: AC
  Filled 2014-08-27: qty 50

## 2014-08-27 MED ORDER — ASPIRIN 81 MG PO CHEW
324.0000 mg | CHEWABLE_TABLET | Freq: Once | ORAL | Status: AC
  Administered 2014-08-27: 324 mg via ORAL
  Filled 2014-08-27: qty 4

## 2014-08-27 NOTE — ED Notes (Signed)
Cardiology at bedside, pt alert and oriented

## 2014-08-27 NOTE — ED Notes (Signed)
Transported to Cath Lab with family, Film/video editor, RN

## 2014-08-27 NOTE — ED Notes (Signed)
Notified Dr. Wyvonnia Dusky immediately of Jesus Beck's HR in 20's. Jesus Beck alert and oriented. Cath lab pads placed on Jesus Beck with zoll

## 2014-08-27 NOTE — ED Notes (Signed)
HR noted to be in 14s, MD notified, pt placed on pacer pads, IV started

## 2014-08-27 NOTE — H&P (Signed)
History of Present Illness: Patient is a 81 y.o. male with a PMHx of coronary artery disease status post bypass in 1999 with hypertension, hyperlipidemia, OSA-noncompliant with mask, paroxysmal atrial fibrillation, who was admitted to [facility] on 08/27/2014 for evaluation of severe weakness.   He's had generalized weakness since waking up this am. Felt very dizzy when standing. Has had a slow HR for a year or so. Never this slow Dizziness is worse with standing .  He was brought into the emergency room and found to have a heart rate of 24. He's had it slow heart rate in the past but never this slow.  He denies chest pain. Denies any shortness of breath. He is not passed out completely. He's feeling okay lying supine in bed.   Medications: Outpatient medications: No current facility-administered medications on file prior to encounter.   Current Outpatient Prescriptions on File Prior to Encounter  Medication Sig Dispense Refill  . amLODipine (NORVASC) 5 MG tablet TAKE 1 TABLET BY MOUTH EVERY DAY. 90 tablet 1  . apixaban (ELIQUIS) 2.5 MG TABS tablet Take 1 tablet (2.5 mg total) by mouth 2 (two) times daily. 60 tablet 6  . HYDROcodone-acetaminophen (NORCO/VICODIN) 5-325 MG per tablet Take 1 tablet by mouth every 6 (six) hours as needed for pain.    . isosorbide mononitrate (IMDUR) 60 MG 24 hr tablet TAKE 1 AND 1/2 TABLETS BY MOUTH EVERY DAY 135 tablet 0  . losartan (COZAAR) 100 MG tablet TAKE 1 TABLET BY MOUTH EVERY DAY 90 tablet 1  . Omega-3 Fatty Acids (OMEGA 3 PO) Take by mouth daily.    . simvastatin (ZOCOR) 20 MG tablet TAKE 1 TABLET BY MOUTH EVERY NIGHT AT BEDTIME 90 tablet 0  . traMADol (ULTRAM) 50 MG tablet Take 50 mg by mouth every 6 (six) hours as needed.     . nitroGLYCERIN (NITROSTAT) 0.4 MG SL tablet Place 0.4 mg under the tongue every 5 (five) minutes as needed for chest pain.       Allergies  Allergen Reactions  .  Lipitor [Atorvastatin] Hives     Past Medical History  Diagnosis Date  . Hypercholesteremia   . HTN (hypertension)   . Back pain   . Atrial fibrillation     paroxysmal atrial fibrillation- stopped sotolol due to HR 44 (on 40mg  BID)  . Hypersomnia   . Dyslipidemia   . CAD (coronary artery disease)     s/p CABG 1999 with LIMA to MAD, SVG to first diagonal, SVG to RCA and PCI with DES mid LAD 06/09 and residual 70% D2 not amenable to PCI due to small caliber Cardiology Dr. Marlou Porch  . RBBB   . Carotid artery stenosis     s/p bialteral CEA's 2004 with persistent bruit on left and minimal plaque by carotid dopplers 2010  . Renal insufficiency     Past Surgical History  Procedure Laterality Date  . Cholecystectomy    . Coronary artery bypass graft    . Hernia repair    . Intraocular lens insertion      History reviewed. No pertinent family history.  Social History:  reports that he has never smoked. He does not have any smokeless tobacco history on file. He reports that he does not drink alcohol or use illicit drugs.   Review of Systems: Constitutional:  denies fever, chills, diaphoresis, appetite change and fatigue.  HEENT: denies photophobia, eye pain, redness, hearing loss, ear pain, congestion, sore throat, rhinorrhea, sneezing, neck pain, neck stiffness and  tinnitus.  Respiratory: denies SOB, DOE, cough, chest tightness, and wheezing.  Cardiovascular: denies chest pain, palpitations and leg swelling.  Gastrointestinal: denies nausea, vomiting, abdominal pain, diarrhea, constipation, blood in stool.  Genitourinary: denies dysuria, urgency, frequency, hematuria, flank pain and difficulty urinating.  Musculoskeletal: denies myalgias, back pain, joint swelling, arthralgias and gait problem.   Skin: denies pallor, rash and wound.  Neurological: admits to dizziness, seizures, syncope, weakness,  light-headedness,   Hematological: denies adenopathy, easy bruising, personal or family bleeding history.  Psychiatric/ Behavioral: denies suicidal ideation, mood changes, confusion, nervousness, sleep disturbance and agitation.    Physical Exam: BP 129/85 mmHg  Pulse 25  Resp 17  Ht 5\' 10"  (1.778 m)  Wt 155 lb (70.308 kg)  BMI 22.24 kg/m2  SpO2 91%  Wt Readings from Last 3 Encounters:  08/27/14 155 lb (70.308 kg)  05/15/14 163 lb (73.936 kg)  11/16/13 157 lb (71.215 kg)    General: Vital signs reviewed and noted. Elderly man, NAD, awake and alert lying in bed.   Head: Normocephalic, atraumatic, sclera anicteric, mucus membranes are moist   Neck: Supple. JVD not elevated.   Lungs:  Clear bilaterally to auscultation without wheezes, rales, or rhonchi. Breathing is normal   Heart: Irreg. Irreg. HR is very bradycardic   Abdomen:  Soft, non-tender, non-distended with normoactive bowel sounds. No hepatomegaly. No rebound/guarding. No obvious abdominal masses   MSK: Strength and the appear normal for age.   Extremities: No clubbing or cyanosis. No edema. Distal pedal pulses are 2+ and equal bilaterally .  Neurologic: Alert and oriented X 3. Moves all extremities spontaneously   Psych: normal     Lab results: Basic Metabolic Panel:  Last Labs      Recent Labs Lab 08/27/14 0743  NA 139  K 4.0  CL 104  GLUCOSE 160*  BUN 34*  CREATININE 1.90*      Liver Function Tests:  Last Labs     No results for input(s): AST, ALT, ALKPHOS, BILITOT, PROT, ALBUMIN in the last 168 hours.    Last Labs     No results for input(s): LIPASE, AMYLASE in the last 168 hours.    CBC:  Last Labs      Recent Labs Lab 08/27/14 0743  HGB 14.6  HCT 43.0      Cardiac Enzymes:  Last Labs     No results for input(s): CKTOTAL, CKMB, CKMBINDEX, TROPONINI in the last 168 hours.    BNP:  Last Labs     Invalid input(s): POCBNP     CBG:  Last Labs     No results for input(s): GLUCAP in the last 168 hours.    Coagulation Studies:  Recent Labs (last 2 labs)     No results for input(s): LABPROT, INR in the last 72 hours.     Other results: ECG - personally reviewed - atrial fib with ventricular escape of 24. RBBB   Imaging:  Imaging Results (Last 48 hours)     Dg Chest Portable 1 View  08/27/2014 CLINICAL DATA: Abdominal pain EXAM: PORTABLE CHEST - 1 VIEW COMPARISON: 09/03/2010 FINDINGS: Sharply defined ovoid opacity in the right mid lung. There is chronic elevation of the right diaphragm which is likely postsurgical. Normal heart size and stable aortic tortuosity post cholecystectomy. No edema, free-flowing effusion, or pneumothorax. Skin fold noted on the left. IMPRESSION: Fissural fluid or atelectasis on the right. Chest x-ray or CT follow-up in 4 to 6 weeks is recommended to evaluate for  persistence/underlying pathology. Electronically Signed By: Monte Fantasia M.D. On: 08/27/2014 07:57       Assessment & Plan:  1. Marked bradycardia. The patient has underlying atrial fibrillation. His heart rate is been slow and now his heart rate is extremely slow. He has associated symptoms of weakness and lightheadedness. He feels okay lying supine. He's not on any medications that would slow his heart rate. We'll check a TSH.  We will anticipate pacemaker placement either later today or perhaps tomorrow. I discussed the case with Dr. Lovena Le. It is okay to feed him breakfast.  2. Hypertension: Blood pressures fairly well-controlled.  3. Hyperlipidemia: Continue current medications.   4. CAD- no angina. I do not think his bradycardia is related to ischemia.   5. Obstructive sleep apnea: Has not tolerated CPAP mask in the past.   DVT PPX -he's on chronic anticoagulation     Ihave discussed the indications of PPM with the patient. He is critically ill with no escape rhythm, has a temporary  PM in place which is not capturing and is being transcutanously paced and will undergo PPM now.   [name]

## 2014-08-27 NOTE — Progress Notes (Signed)
Orthopedic Tech Progress Note Patient Details:  Jesus Beck April 13, 1926 [id number]  Ortho Devices Type of Ortho Device: Arm sling Ortho Device/Splint Location: lue Ortho Device/Splint Interventions: Application   [name] 08/27/2014, 1:08 PM

## 2014-08-27 NOTE — ED Provider Notes (Signed)
CSN: [id number]     Arrival date & time 08/27/14  0708 History   First MD Initiated Contact with Patient 08/27/14 [phone]     Chief Complaint  Patient presents with  . Abdominal Pain  . Bradycardia     (Consider location/radiation/quality/duration/timing/severity/associated sxs/prior Treatment) HPI Comments: Patient states woke up this morning feeling poorly with dizziness, lightheadedness, weakness and upper abdominal pain. Pain resolved after he vomited times one. Felt well when he developed last night. No chest pain or shortness of breath. Endorses dizziness and feeling lightheaded. Patient is alert and oriented. Found to have heart rate in the 20s on arrival. Blood pressure is 1 teens to 100s. Patient is awake and alert. Denies any pain. Abdominal pain has resolved.  The history is provided by the patient and a relative. The history is limited by the condition of the patient.    Past Medical History  Diagnosis Date  . Hypercholesteremia   . HTN (hypertension)   . Back pain   . Atrial fibrillation     paroxysmal atrial fibrillation- stopped sotolol due to HR 44 (on 40mg  BID)  . Hypersomnia   . Dyslipidemia   . CAD (coronary artery disease)     s/p CABG 1999 with LIMA to MAD, SVG to first diagonal, SVG to RCA and PCI with DES mid LAD 06/09 and residual 70% D2 not amenable to PCI due to small caliber Cardiology Dr. Marlou Porch  . RBBB   . Carotid artery stenosis      s/p bialteral CEA's 2004 with persistent bruit on left and minimal plaque by carotid dopplers 2010  . Renal insufficiency   . Atrial fibrillation    Past Surgical History  Procedure Laterality Date  . Cholecystectomy    . Coronary artery bypass graft    . Hernia repair    . Intraocular lens insertion    . Carotid endarterectomy Bilateral    History reviewed. No pertinent family history. History  Substance Use Topics  . Smoking status: Never Smoker   . Smokeless tobacco: Not on file  . Alcohol Use: No    Review  of Systems  Unable to perform ROS: Acuity of condition  Gastrointestinal: Positive for abdominal pain.      Allergies  Lipitor  Home Medications   Prior to Admission medications   Medication Sig Start Date End Date Taking? Authorizing Provider  amLODipine (NORVASC) 5 MG tablet TAKE 1 TABLET BY MOUTH EVERY DAY. 06/15/14  Yes Jerline Pain, MD  apixaban (ELIQUIS) 2.5 MG TABS tablet Take 1 tablet (2.5 mg total) by mouth 2 (two) times daily. 06/26/14  Yes Jerline Pain, MD  HYDROcodone-acetaminophen (NORCO/VICODIN) 5-325 MG per tablet Take 1 tablet by mouth every 6 (six) hours as needed for pain.   Yes Historical Provider, MD  isosorbide mononitrate (IMDUR) 60 MG 24 hr tablet TAKE 1 AND 1/2 TABLETS BY MOUTH EVERY DAY 07/10/14  Yes Jerline Pain, MD  losartan (COZAAR) 100 MG tablet TAKE 1 TABLET BY MOUTH EVERY DAY 04/11/14  Yes Jerline Pain, MD  Omega-3 Fatty Acids (OMEGA 3 PO) Take by mouth daily.   Yes Historical Provider, MD  simvastatin (ZOCOR) 20 MG tablet TAKE 1 TABLET BY MOUTH EVERY NIGHT AT BEDTIME   Yes Jerline Pain, MD  traMADol (ULTRAM) 50 MG tablet Take 50 mg by mouth every 6 (six) hours as needed.  02/28/13  Yes Historical Provider, MD  nitroGLYCERIN (NITROSTAT) 0.4 MG SL tablet Place 0.4 mg under the tongue  every 5 (five) minutes as needed for chest pain.    Historical Provider, MD   BP 103/52 mmHg  Pulse 62  Temp(Src) 97.7 F (36.5 C) (Oral)  Resp 15  Ht 5\' 10"  (1.778 m)  Wt 155 lb (70.308 kg)  BMI 22.24 kg/m2  SpO2 94% Physical Exam  Constitutional: He is oriented to person, place, and time. He appears well-developed and well-nourished. No distress.  HENT:  Head: Normocephalic and atraumatic.  Mouth/Throat: Oropharynx is clear and moist. No oropharyngeal exudate.  Eyes: Conjunctivae and EOM are normal. Pupils are equal, round, and reactive to light.  Neck: Normal range of motion. Neck supple.  No meningismus.  Cardiovascular: Normal rate, normal heart sounds and  intact distal pulses.   No murmur heard. Irregular bradycardia  Pulmonary/Chest: Effort normal and breath sounds normal. No respiratory distress.  Abdominal: Soft. There is no tenderness. There is no rebound and no guarding.  Intact femoral pulses  Musculoskeletal: Normal range of motion. He exhibits no edema or tenderness.  Neurological: He is alert and oriented to person, place, and time. No cranial nerve deficit. He exhibits normal muscle tone. Coordination normal.  No ataxia on finger to nose bilaterally. No pronator drift. 5/5 strength throughout. CN 2-12 intact. Equal grip strength. Sensation intact.  Skin: Skin is warm.  Psychiatric: He has a normal mood and affect. His behavior is normal.  Nursing note and vitals reviewed.   ED Course  Temporary pacer Performed by: Ezequiel Essex Authorized by: Ezequiel Essex Consent: The procedure was performed in an emergent situation. Verbal consent obtained. Risks and benefits: risks, benefits and alternatives were discussed Consent given by: patient Patient understanding: patient states understanding of the procedure being performed Patient consent: the patient's understanding of the procedure matches consent given Patient identity confirmed: provided demographic data and verbally with patient Local anesthesia used: no Patient sedated: yes Sedation type: anxiolysis Sedatives: midazolam Patient tolerance: Patient tolerated the procedure well with no immediate complications   (including critical care time) Labs Review Labs Reviewed  CBC - Abnormal; Notable for the following:    Platelets 147 (*)    All other components within normal limits  COMPREHENSIVE METABOLIC PANEL - Abnormal; Notable for the following:    Glucose, Bld 159 (*)    BUN 32 (*)    Creatinine, Ser 2.08 (*)    AST 40 (*)    Total Bilirubin 1.6 (*)    GFR calc non Af Amer 27 (*)    GFR calc Af Amer 32 (*)    All other components within normal limits  I-STAT  CHEM 8, ED - Abnormal; Notable for the following:    BUN 34 (*)    Creatinine, Ser 1.90 (*)    Glucose, Bld 160 (*)    All other components within normal limits  MRSA PCR SCREENING  PROTIME-INR  TSH  I-STAT TROPOININ, ED    Imaging Review Dg Chest Portable 1 View  08/27/2014   CLINICAL DATA:  Abdominal pain  EXAM: PORTABLE CHEST - 1 VIEW  COMPARISON:  09/03/2010  FINDINGS: Sharply defined ovoid opacity in the right mid lung.  There is chronic elevation of the right diaphragm which is likely postsurgical. Normal heart size and stable aortic tortuosity post cholecystectomy. No edema, free-flowing effusion, or pneumothorax. Skin fold noted on the left.  IMPRESSION: Fissural fluid or atelectasis on the right. Chest x-ray or CT follow-up in 4 to 6 weeks is recommended to evaluate for persistence/underlying pathology.   Electronically Signed  By: Monte Fantasia M.D.   On: 08/27/2014 07:57     EKG Interpretation   Date/Time:  Monday August 27 2014 07:42:14 EDT Ventricular Rate:  40 PR Interval:  608 QRS Duration: 198 QT Interval:  695 QTC Calculation: 567 R Axis:   -89 Text Interpretation:  Pacemaker spikes or artifacts Sinus bradycardia  Prolonged PR interval RBBB and LAFB Abnormal T, probable ischemia, lateral  leads Right bundle branch block Junctional bradycardia Confirmed by  Wyvonnia Dusky  MD, Jacqulyne Gladue ([id number]) on 08/27/2014 7:49:28 AM      MDM   Final diagnoses:  Symptomatic bradycardia   Nausea, weakness, abdominal pain and vomiting. Pain has resolved. Bradycardic in the 20s of the junctional rhythm. Pacer pads placed. Atropine given.  Intermittent right bundle branch block with junctional rhythm on the monitor. Cardiology notified. Patient with history of atrial fibrillation on Eliquis.  Mental status and blood pressure stable. Patient does not appear to need pacing at this time. Dr. Acie Fredrickson bedside and will plan to take patient for pacemaker placement later today.  Patient  developed asystole and received CPR for 20 seconds after being seen by Dr. Acie Fredrickson. Transcutaneous pacing initiated with good capture.  Patient with intact pulse and mentating well.   Taken emergently to cath lab for pacer placement.  Cardiopulmonary Resuscitation (CPR) Procedure Note Directed/Performed by: Ezequiel Essex I personally directed ancillary staff and/or performed CPR in an effort to regain return of spontaneous circulation and to maintain cardiac, neuro and systemic perfusion.    CRITICAL CARE Performed by: Ezequiel Essex Total critical care time: 60 Critical care time was exclusive of separately billable procedures and treating other patients. Critical care was necessary to treat or prevent imminent or life-threatening deterioration. Critical care was time spent personally by me on the following activities: development of treatment plan with patient and/or surrogate as well as nursing, discussions with consultants, evaluation of patient's response to treatment, examination of patient, obtaining history from patient or surrogate, ordering and performing treatments and interventions, ordering and review of laboratory studies, ordering and review of radiographic studies, pulse oximetry and re-evaluation of patient's condition.   Ezequiel Essex, MD 08/27/14 [phone]

## 2014-08-27 NOTE — ED Notes (Signed)
Pt states he woke up this morning having abd pain, lightheaded, weak. Pt states he vomited.

## 2014-08-27 NOTE — CV Procedure (Signed)
EP Procedure Note  Procedure: VVI PM insertion  Preprocedure Diagnosis: Complete heart block, and no escape  Postprocedure Diagnosis: Complete heart block, and no escape  Description of the Procedure: After informed consent was obtained, the patient was taken to the diagnostic EP lab in the fasting state. After the usual preparation draping, intravenous Versed and fentanyl was used for sedation. Previously, an attempt had been made to place a temporary pacemaker, however the patient's temporary pacemaker was unstable and could not maintain capture. He was undergoing transcutaneous pacing at the time of his procedure. 30 cc of lidocaine was infiltrated into the left infraclavicular region. A 5 cm incision was carried out. The left subclavian vein was punctured. The Medtronic model 5076, 58 centimeter active fixation pacing lead, serial number [id number], was advanced under fluoroscopic guidance into the right ventricle. At the final site, the R waves were greater than 12 mV, the pacing impedance was 900 ohms, and the threshold was less than 2 V. The lead was secured to the fascia with silk suture. The sewing sleeve was secured with silk suture. Electrocautery was utilized to make a subcutaneous pocket. Antibiotic irrigation was utilized to irrigate the pocket. The Medtronic single-chamber pacemaker was connected to the ventricular pacing lead, serial number [id number], and placed back in the subcutaneous pocket. The pocket was irrigated with antibiotic irrigation. The incision was closed with 2 layers of Vicryl suture. Benzoin and Steri-Strips her pain on the skin. A pressure dressing was applied. The patient was returned to recovery area in satisfactory condition.  Complications: There were no immediate procedure complications  Conclusion: Successful insertion of a permanent single-chamber pacemaker in a patient with chronic atrial fibrillation, complete heart block, who could not maintain capture with a  temporary pacemaker.  Cristopher Peru, M.D.

## 2014-08-27 NOTE — H&P (Signed)
ADMISSION HISTORY AND PHYSICAL   Date: 08/27/2014               Patient Name:  Jesus Beck MRN: [id number]  DOB: 1926/03/08 Age / Sex: 81 y.o., male        PCP: Kandice Hams Primary Cardiologist: Marlou Porch         History of Present Illness: Patient is a 81 y.o. male with a PMHx of coronary artery disease status post bypass in 1999 with hypertension, hyperlipidemia, OSA-noncompliant with mask, paroxysmal atrial fibrillation, who was admitted to [facility] on 08/27/2014 for evaluation of severe weakness.   He's had generalized weakness since waking up this am.  Felt very dizzy when standing.   Has had a slow HR for a year or so.  Never this slow Dizziness is worse with standing .  He was brought into the emergency room and found to have a heart rate of 24. He's had it slow heart rate in the past but never this slow.  He denies chest pain. Denies any shortness of breath. He is not passed out completely. He's feeling okay lying supine in bed.   Medications: Outpatient medications: No current facility-administered medications on file prior to encounter.   Current Outpatient Prescriptions on File Prior to Encounter  Medication Sig Dispense Refill  . amLODipine (NORVASC) 5 MG tablet TAKE 1 TABLET BY MOUTH EVERY DAY. 90 tablet 1  . apixaban (ELIQUIS) 2.5 MG TABS tablet Take 1 tablet (2.5 mg total) by mouth 2 (two) times daily. 60 tablet 6  . HYDROcodone-acetaminophen (NORCO/VICODIN) 5-325 MG per tablet Take 1 tablet by mouth every 6 (six) hours as needed for pain.    . isosorbide mononitrate (IMDUR) 60 MG 24 hr tablet TAKE 1 AND 1/2 TABLETS BY MOUTH EVERY DAY 135 tablet 0  . losartan (COZAAR) 100 MG tablet TAKE 1 TABLET BY MOUTH EVERY DAY 90 tablet 1  . Omega-3 Fatty Acids (OMEGA 3 PO) Take by mouth daily.    . simvastatin (ZOCOR) 20 MG tablet TAKE 1 TABLET BY MOUTH EVERY NIGHT AT BEDTIME 90 tablet 0  . traMADol (ULTRAM) 50 MG tablet Take 50 mg by mouth every 6 (six) hours as needed.      . nitroGLYCERIN (NITROSTAT) 0.4 MG SL tablet Place 0.4 mg under the tongue every 5 (five) minutes as needed for chest pain.       Allergies  Allergen Reactions  . Lipitor [Atorvastatin] Hives     Past Medical History  Diagnosis Date  . Hypercholesteremia   . HTN (hypertension)   . Back pain   . Atrial fibrillation     paroxysmal atrial fibrillation- stopped sotolol due to HR 44 (on 40mg  BID)  . Hypersomnia   . Dyslipidemia   . CAD (coronary artery disease)     s/p CABG 1999 with LIMA to MAD, SVG to first diagonal, SVG to RCA and PCI with DES mid LAD 06/09 and residual 70% D2 not amenable to PCI due to small caliber Cardiology Dr. Marlou Porch  . RBBB   . Carotid artery stenosis      s/p bialteral CEA's 2004 with persistent bruit on left and minimal plaque by carotid dopplers 2010  . Renal insufficiency     Past Surgical History  Procedure Laterality Date  . Cholecystectomy    . Coronary artery bypass graft    . Hernia repair    . Intraocular lens insertion      History reviewed. No pertinent family  history.  Social History:  reports that he has never smoked. He does not have any smokeless tobacco history on file. He reports that he does not drink alcohol or use illicit drugs.   Review of Systems: Constitutional:  denies fever, chills, diaphoresis, appetite change and fatigue.  HEENT: denies photophobia, eye pain, redness, hearing loss, ear pain, congestion, sore throat, rhinorrhea, sneezing, neck pain, neck stiffness and tinnitus.  Respiratory: denies SOB, DOE, cough, chest tightness, and wheezing.  Cardiovascular: denies chest pain, palpitations and leg swelling.  Gastrointestinal: denies nausea, vomiting, abdominal pain, diarrhea, constipation, blood in stool.  Genitourinary: denies dysuria, urgency, frequency, hematuria, flank pain and difficulty urinating.  Musculoskeletal: denies  myalgias, back pain, joint swelling, arthralgias and gait problem.   Skin: denies  pallor, rash and wound.  Neurological: admits to dizziness, seizures, syncope, weakness, light-headedness,   Hematological: denies adenopathy, easy bruising, personal or family bleeding history.  Psychiatric/ Behavioral: denies suicidal ideation, mood changes, confusion, nervousness, sleep disturbance and agitation.    Physical Exam: BP 129/85 mmHg  Pulse 25  Resp 17  Ht 5\' 10"  (1.778 m)  Wt 155 lb (70.308 kg)  BMI 22.24 kg/m2  SpO2 91%  Wt Readings from Last 3 Encounters:  08/27/14 155 lb (70.308 kg)  05/15/14 163 lb (73.936 kg)  11/16/13 157 lb (71.215 kg)    General: Vital signs reviewed and noted. Elderly man, NAD, awake and alert lying in bed.   Head: Normocephalic, atraumatic, sclera anicteric, mucus membranes are moist   Neck: Supple.  JVD not elevated.   Lungs:  Clear bilaterally to auscultation without wheezes, rales, or rhonchi. Breathing is normal   Heart: Irreg. Irreg. HR is very bradycardic    Abdomen:  Soft, non-tender, non-distended with normoactive bowel sounds. No hepatomegaly. No rebound/guarding. No obvious abdominal masses   MSK: Strength and the appear normal for age.   Extremities: No clubbing or cyanosis. No edema.  Distal pedal pulses are 2+ and equal bilaterally .  Neurologic: Alert and oriented X 3. Moves all extremities spontaneously   Psych:  normal     Lab results: Basic Metabolic Panel:  Recent Labs Lab 08/27/14 0743  NA 139  K 4.0  CL 104  GLUCOSE 160*  BUN 34*  CREATININE 1.90*    Liver Function Tests: No results for input(s): AST, ALT, ALKPHOS, BILITOT, PROT, ALBUMIN in the last 168 hours. No results for input(s): LIPASE, AMYLASE in the last 168 hours.  CBC:  Recent Labs Lab 08/27/14 0743  HGB 14.6  HCT 43.0    Cardiac Enzymes: No results for input(s): CKTOTAL, CKMB, CKMBINDEX, TROPONINI in the last 168 hours.  BNP: Invalid input(s): POCBNP  CBG: No results for input(s): GLUCAP in the last 168 hours.  Coagulation  Studies: No results for input(s): LABPROT, INR in the last 72 hours.   Other results: ECG - personally reviewed - atrial fib with ventricular escape of 24.  RBBB   Imaging: Dg Chest Portable 1 View  08/27/2014   CLINICAL DATA:  Abdominal pain  EXAM: PORTABLE CHEST - 1 VIEW  COMPARISON:  09/03/2010  FINDINGS: Sharply defined ovoid opacity in the right mid lung.  There is chronic elevation of the right diaphragm which is likely postsurgical. Normal heart size and stable aortic tortuosity post cholecystectomy. No edema, free-flowing effusion, or pneumothorax. Skin fold noted on the left.  IMPRESSION: Fissural fluid or atelectasis on the right. Chest x-ray or CT follow-up in 4 to 6 weeks is recommended to evaluate for persistence/underlying  pathology.   Electronically Signed   By: Monte Fantasia M.D.   On: 08/27/2014 07:57      Assessment & Plan:  1.  Marked bradycardia. The patient has underlying atrial fibrillation. His heart rate is been slow and now his heart rate is extremely slow. He has associated symptoms of weakness and lightheadedness. He feels okay lying supine. He's not on any medications that would slow his heart rate. We'll check a TSH.  We will anticipate pacemaker placement either later today or perhaps tomorrow. I discussed the case with Dr. Lovena Le. It is okay to feed him breakfast.  2. Hypertension: Blood pressures fairly well-controlled.  3. Hyperlipidemia: Continue current medications.   4. CAD- no angina.  I do not think his bradycardia is related to ischemia.    5. Obstructive sleep apnea:  Has not tolerated CPAP mask in the past.   DVT PPX -he's on chronic anticoagulation   Ramond Dial., MD, [facility] 08/27/2014, 8:02 AM

## 2014-08-27 NOTE — ED Notes (Signed)
Pt resting in bed, denies complaints.  

## 2014-08-27 NOTE — CV Procedure (Signed)
    TEMPORARY PACEMAKER REPORT  Jesus Beck  81 y.o.  male Mar 07, 1926  Procedure Date: 08/27/2014 Referring Physician: Mertie Moores, M.D. Primary Cardiologist: Edythe Clarity, M.D.  INDICATIONS: Bradycardia/asystole with repeated loss of consciousness and CPR in the emergency room  PROCEDURE: Temporary transvenous pacemaker via right femoral vein approach  CONSENT:  The risks, benefits, and details of the procedure were explained in detail to the patient under emergency circumstances.. Risks including death, stroke, heart attack, kidney injury, allergy, limb ischemia, bleeding and radiation injury were discussed.  The patient verbalized understanding and wanted to proceed.  Informed written consent was obtained.  PROCEDURE TECHNIQUE:  The patient was brought to the catheterization laboratory with an external/transcutaneous pacemaker providing his rhythm.  The right iliofemoral region was sterilely prepped and draped. A 5 French sheath was placed in the right femoral vein after 1% Xylocaine local anesthesia. We then advanced a 5 French balloon tipped bipolar pacing catheter into the right atrium. With the balloon inflated and deflated we had difficulty entering the right ventricle. We were successful in entering the right ventricle but then had difficulty obtaining a suitable position in the right ventricle for pacing. Multiple sites were sampled. Catheter was very unstable and would eventually moved and She would be loss. Additionally, on two occasions we had stable pacing but the patient developed nausea and vomiting followed by catheter dislodgement with loss of capture.  After multiple attempts, a consult with Dr. Cristopher Peru who came into the cath suite. After multiple additional attempts under his direction, we were not able to attain stable pacing position.  By the telemeter the decision of permanent pacemaker insertion should be performed.  IMPRESSION: 1. Bradycardia asystolic  cardiac arrest and multiple episodes of Stokes-Adams syncope. 2. Successful insertion of a temporary transvenous pacemaker but with inability to attain a stable pacing position in the right ventricle.  PLAN: Urgent permanent pacemaker insertion per Dr. Lovena Le. Continue transcutaneous pacing.

## 2014-08-27 NOTE — ED Notes (Signed)
Pt paced at rate ppm 60 mma 86. Tolerating well. Answering questions

## 2014-08-27 NOTE — ED Notes (Signed)
Pt had episode x2 of asystole, CPR initiated x2 for approx 10-20 compressions, Dr Stephens November at bedside

## 2014-08-27 NOTE — Care Management Note (Addendum)
    Page 1 of 1   08/29/2014     3:20:52 PM CARE MANAGEMENT NOTE 08/29/2014  Patient:  Jesus Beck, Jesus Beck   Account Number:  [id number]  Date Initiated:  08/27/2014  Documentation initiated by:  Elissa Hefty  Subjective/Objective Assessment:   adm w heart block     Action/Plan:   lives w fam, pcp dr Jori Moll polite   Anticipated DC Date:     Anticipated DC Plan:  [place]  CM consult      PAC Choice  DURABLE MEDICAL EQUIPMENT   Choice offered to / List presented to:     DME arranged  [facility]      DME agency  [facility].        Status of service:  Completed, signed off Medicare Important Message given?  NO (If response is "NO", the following Medicare IM given date fields will be blank) Date Medicare IM given:   Medicare IM given by:   Date Additional Medicare IM given:   Additional Medicare IM given by:    Discharge Disposition:  HOME/SELF CARE  Per UR Regulation:  Reviewed for med. necessity/level of care/duration of stay  If discussed at [place] of Stay Meetings, dates discussed:    Comments:  08-29-14 1518 Jacqlyn Krauss, RN,BSN [phone] Pt without qualifying diagnosis for 02. Pt refusing due to cost out of pocket. RN aware. No further needs from [facility].   08-29-14 1416 Jacqlyn Krauss, RN,BSN [phone] DME to be delivered to room before d/c. No further needs from CM at this time.

## 2014-08-28 ENCOUNTER — Encounter (HOSPITAL_COMMUNITY): Payer: Self-pay | Admitting: Interventional Cardiology

## 2014-08-28 ENCOUNTER — Inpatient Hospital Stay (HOSPITAL_COMMUNITY): Payer: Medicare Other

## 2014-08-28 LAB — BASIC METABOLIC PANEL
Anion gap: 10 (ref 5–15)
BUN: 30 mg/dL — AB (ref 6–23)
CALCIUM: 9 mg/dL (ref 8.4–10.5)
CHLORIDE: 105 mmol/L (ref 96–112)
CO2: 27 mmol/L (ref 19–32)
Creatinine, Ser: 1.67 mg/dL — ABNORMAL HIGH (ref 0.50–1.35)
GFR, EST AFRICAN AMERICAN: 41 mL/min — AB (ref >90)
GFR, EST NON AFRICAN AMERICAN: 35 mL/min — AB (ref >90)
GLUCOSE: 97 mg/dL (ref 70–99)
POTASSIUM: 4.3 mmol/L (ref 3.5–5.1)
SODIUM: 142 mmol/L (ref 135–145)

## 2014-08-28 MED ORDER — ENOXAPARIN SODIUM 40 MG/0.4ML ~~LOC~~ SOLN
40.0000 mg | SUBCUTANEOUS | Status: DC
Start: 2014-08-29 — End: 2014-08-29
  Administered 2014-08-29: 40 mg via SUBCUTANEOUS
  Filled 2014-08-28: qty 0.4

## 2014-08-28 MED ORDER — MAGNESIUM HYDROXIDE 400 MG/5ML PO SUSP
15.0000 mL | Freq: Every day | ORAL | Status: DC | PRN
  Administered 2014-08-28: 15 mL via ORAL
  Filled 2014-08-28 (×2): qty 30

## 2014-08-28 MED ORDER — ALUM & MAG HYDROXIDE-SIMETH 200-200-20 MG/5ML PO SUSP
30.0000 mL | ORAL | Status: DC | PRN
  Filled 2014-08-28: qty 30

## 2014-08-28 NOTE — Progress Notes (Signed)
Placed patient on BIPAP auto max IPAP 10cmH20, min EPAP 4cmH20 with 8L 02 bleed in via a large full face mask.  Patient is tolerating well at this time.

## 2014-08-28 NOTE — Progress Notes (Signed)
eLink Physician-Brief Progress Note Patient Name: Jesus Beck DOB: 17-Nov-1925 MRN: [id number]   Date of Service  08/28/2014  HPI/Events of Note  Camera rounds show patient to be on venti-mask with sats in the 106s.  Discussed with nurse at bedside.  Apparently patient uses CPAP at home but has refused.  Discussed with patient.  Recommended NIV while sleeping.  He has agreed to try BiPAP.  eICU Interventions  Plan: Initiate BiPAP Continue to monitor sats.     Intervention Category Intermediate Interventions: Respiratory distress - evaluation and management  DETERDING,ELIZABETH 08/28/2014, 1:09 AM

## 2014-08-28 NOTE — Discharge Summary (Addendum)
ELECTROPHYSIOLOGY PROCEDURE DISCHARGE SUMMARY    Patient ID: Jesus Beck,  MRN: [id number], DOB/AGE: 12/21/1925 81 y.o.  Admit date: 08/27/2014 Discharge date: 08/29/2014  Primary Care Physician: Kandice Hams, MD Primary Cardiologist: Marlou Porch Electrophysiologist: Lovena Le  Primary Discharge Diagnosis:  Symptomatic complete heart block status post pacemaker implantation this admission  Secondary Discharge Diagnosis:  1.  CAD s/p CABG 2.  Permanent atrial fibrillation 3.  Hypertension 4.  Hyperlipidemia 5.  Sleep apnea - not wearing CPAP  Allergies  Allergen Reactions  . Lipitor [Atorvastatin] Hives     Procedures This Admission:  1.  Implantation of a temporary transvenous pacemaker on 08-27-14 by Dr Tamala Julian.  The patient had unreliable capture and underwent urgent implantation of a permanent pacemaker by Dr Lovena Le same day.    2.  Implantation of a MDT single chamber PPM on 08-27-14 by Dr Lovena Le.  The patient received a MDT PPM with model number 5076 right ventricular lead. There were no immediate post procedure complications. 2.  CXR on 08-28-14 demonstrated no pneumothorax status post device implantation.   Brief HPI/Hospital Course:  Jesus Beck is a 81 y.o. male with a past medical history as outlined above.  He developed dizziness, weakness and fatigue and presented to the ER for further evaluation.  He was found to be in atrial fibrillation with complete heart block and was taken urgently for temporary transvenous pacemaker.  Unfortunately, the pacemaker did not have reliable capture and he required urgent permanent pacemaker implantation by Dr Lovena Le same day.   The patient underwent implantation of a MDT single chamber pacemaker with details as outlined above.  He  was monitored on telemetry overnight which demonstrated atrial fibrillation with ventricular pacing.  Left chest was without hematoma or ecchymosis.  The device was interrogated and found to be functioning  normally.  CXR was obtained and demonstrated no pneumothorax status post device implantation.  Wound care, arm mobility, and restrictions were reviewed with the patient.  The patient was examined and considered stable for discharge to home.   He was monitored on telemetry an extra 24 hours due to complete heart block with no escape and need for PT evaluation.  PT saw the patient and felt that he was stable for discharge to home.    Physical Exam: Filed Vitals:   08/28/14 0900 08/28/14 1538 08/28/14 2100 08/29/14 0509  BP: 115/51 124/58 130/61 111/37  Pulse: 63 62 66 60  Temp:  99.4 F (37.4 C) 98.8 F (37.1 C) 100.1 F (37.8 C)  TempSrc:  Oral Oral Oral  Resp: 18 18 20 22   Height:      Weight:    155 lb 12.8 oz (70.67 kg)  SpO2: 89% 93% 88% 90%    GEN- The patient is elderly appearing, alert and oriented x 3 today.   HEENT: normocephalic, atraumatic; sclera clear, conjunctiva pink; hearing intact; oropharynx clear; neck supple, no JVP Lymph- no cervical lymphadenopathy Lungs- Clear to ausculation bilaterally, mild work of breathing.  No wheezes, rales, rhonchi Heart- Regular rate and rhythm (paced) 3/6 systolic murmur  GI- soft, non-tender, non-distended, bowel sounds present  Extremities- no clubbing, cyanosis, or edema  MS- no significant deformity or atrophy Skin- warm and dry, no rash or lesion, left chest without hematoma/ecchymosis Psych- euthymic mood, full affect Neuro- strength and sensation are intact   Labs:   Lab Results  Component Value Date   WBC 8.4 08/27/2014   HGB 14.6 08/27/2014   HCT 43.0 08/27/2014  MCV 92.8 08/27/2014   PLT 147* 08/27/2014    Recent Labs Lab 08/27/14 0730  08/29/14 0322  NA 138  < > 138  K 4.0  < > 4.3  CL 100  < > 105  CO2 23  < > 24  BUN 32*  < > 31*  CREATININE 2.08*  < > 1.54*  CALCIUM 9.6  < > 9.1  PROT 6.3  --   --   BILITOT 1.6*  --   --   ALKPHOS 56  --   --   ALT 32  --   --   AST 40*  --   --   GLUCOSE 159*   < > 98  < > = values in this interval not displayed.  Discharge Medications:    Medication List    TAKE these medications        amLODipine 5 MG tablet  Commonly known as:  NORVASC  TAKE 1 TABLET BY MOUTH EVERY DAY.     apixaban 2.5 MG Tabs tablet  Commonly known as:  ELIQUIS  Take 1 tablet (2.5 mg total) by mouth 2 (two) times daily.     HYDROcodone-acetaminophen 5-325 MG per tablet  Commonly known as:  NORCO/VICODIN  Take 1 tablet by mouth every 6 (six) hours as needed for pain.     isosorbide mononitrate 60 MG 24 hr tablet  Commonly known as:  IMDUR  TAKE 1 AND 1/2 TABLETS BY MOUTH EVERY DAY     losartan 100 MG tablet  Commonly known as:  COZAAR  TAKE 1 TABLET BY MOUTH EVERY DAY     nitroGLYCERIN 0.4 MG SL tablet  Commonly known as:  NITROSTAT  Place 0.4 mg under the tongue every 5 (five) minutes as needed for chest pain.     OMEGA 3 PO  Take by mouth daily.     simvastatin 20 MG tablet  Commonly known as:  ZOCOR  TAKE 1 TABLET BY MOUTH EVERY NIGHT AT BEDTIME     traMADol 50 MG tablet  Commonly known as:  ULTRAM  Take 50 mg by mouth every 6 (six) hours as needed.        Disposition:  Discharge Instructions    Diet - low sodium heart healthy    Complete by:  As directed      Increase activity slowly    Complete by:  As directed           Follow-up Information    Follow up with [facility] On 09/10/2014.   Why:  at 12 noon for wound check   Contact information:   [address] [place] [address]       Duration of Discharge Encounter: Greater than 30 minutes including physician time.  Signed, Chanetta Marshall, NP 08/29/2014 7:25 AM Pt has expressed reluctance or refusal to use home 02  CXR yday with atelectasis and mild chf Will use incentive spirometer and give one dose iv lasix See if we cant improve situation prior to discharge  Instructions given

## 2014-08-28 NOTE — Evaluation (Signed)
Physical Therapy Evaluation Patient Details Name: Jesus Beck MRN: [id number] DOB: 1926/03/29 Today's Date: 08/28/2014   History of Present Illness  Pt adm with complete heart block and underwent pacer placement on 08/27/14. PMH - CABG, CAD, HTN  Clinical Impression  Pt presents to PT with unsteady gait and decr SaO2. Needs skilled PT to maximize independence and safety so pt can return home with supportive son. Pt with SaO2 of 88-89% at rest on 4L of O2. After amb on 4L of O2 SaO2 improved to 93-95%.     Follow Up Recommendations No PT follow up;Supervision for mobility/OOB (initially)    Equipment Recommendations  None recommended by PT    Recommendations for Other Services       Precautions / Restrictions Precautions Precautions: Fall;ICD/Pacemaker      Mobility  Bed Mobility Overal bed mobility: Needs Assistance Bed Mobility: Supine to Sit;Sit to Supine     Supine to sit: Min assist;HOB elevated Sit to supine: HOB elevated;Modified independent (Device/Increase time)   General bed mobility comments: Assist to bring trunk up  Transfers Overall transfer level: Needs assistance Equipment used: None Transfers: Sit to/from Stand Sit to Stand: Min guard         General transfer comment: Assist for safety and balance  Ambulation/Gait Ambulation/Gait assistance: Min guard Ambulation Distance (Feet): 300 Feet Assistive device: None       General Gait Details: Assist for balance. At rest and O2 at 4L pt with SaO2 88-89% (with good wave form on monitor). After amb on 4L of O2 SaO2 93-95%.  Stairs            Wheelchair Mobility    Modified Rankin (Stroke Patients Only)       Balance Overall balance assessment: Needs assistance Sitting-balance support: No upper extremity supported;Feet supported Sitting balance-Leahy Scale: Normal     Standing balance support: No upper extremity supported Standing balance-Leahy Scale: Good                               Pertinent Vitals/Pain Pain Assessment: No/denies pain    Home Living Family/patient expects to be discharged to:: Private residence Living Arrangements: Children Available Help at Discharge: Family Type of Home: House       Home Layout: Two level Home Equipment: None      Prior Function Level of Independence: Independent         Comments: Active. Drives and works in garden.     Hand Dominance        Extremity/Trunk Assessment   Upper Extremity Assessment: Overall WFL for tasks assessed           Lower Extremity Assessment: Overall WFL for tasks assessed         Communication   Communication: No difficulties  Cognition Arousal/Alertness: Awake/alert Behavior During Therapy: WFL for tasks assessed/performed Overall Cognitive Status: Within Functional Limits for tasks assessed                      General Comments      Exercises        Assessment/Plan    PT Assessment Patient needs continued PT services  PT Diagnosis Difficulty walking   PT Problem List Decreased strength;Decreased activity tolerance;Decreased balance;Decreased mobility;Cardiopulmonary status limiting activity  PT Treatment Interventions DME instruction;Balance training;Gait training;Stair training;Patient/family education;Functional mobility training;Therapeutic activities;Therapeutic exercise   PT Goals (Current goals can be found in the Care Plan  section) Acute Rehab PT Goals Patient Stated Goal: return home PT Goal Formulation: With patient Time For Goal Achievement: 09/04/14 Potential to Achieve Goals: Good    Frequency Min 3X/week   Barriers to discharge        Co-evaluation               End of Session   Activity Tolerance: Patient tolerated treatment well Patient left: in bed;with call bell/phone within reach;with family/visitor present Nurse Communication: Mobility status;Other (comment) (pt c/o IV site)         Time:  1500-1520 PT Time Calculation (min) (ACUTE ONLY): 20 min   Charges:   PT Evaluation $Initial PT Evaluation Tier I: 1 Procedure     PT G Codes:        Kasumi Ditullio 08/29/14, 4:08 PM  [facility] PT [phone]

## 2014-08-28 NOTE — Progress Notes (Signed)
SUBJECTIVE: The patient is doing well today.  At this time, he denies chest pain, shortness of breath, or any new concerns.  He has some mid sternal chest soreness.  Not worse with inspiration.   CURRENT MEDICATIONS: .  ceFAZolin (ANCEF) IV  1 g Intravenous Q12H  . enoxaparin (LOVENOX) injection  30 mg Subcutaneous Q24H  . isosorbide mononitrate  90 mg Oral Daily  . sodium chloride  3 mL Intravenous Q12H   . sodium chloride 50 mL/hr (08/27/14 1900)  . sodium chloride      OBJECTIVE: Physical Exam: Filed Vitals:   08/28/14 0300 08/28/14 0400 08/28/14 0500 08/28/14 0600  BP: 138/56 134/119 141/66 121/58  Pulse: 60 60 59 63  Temp:      TempSrc:      Resp: 23 17 18 18   Height:      Weight:      SpO2: 96% 99% 98% 88%    Intake/Output Summary (Last 24 hours) at 08/28/14 0630 Last data filed at 08/28/14 0600  Gross per 24 hour  Intake   1433 ml  Output   1125 ml  Net    308 ml    Telemetry reveals AF with V pacing  GEN- The patient is elderly appearing, alert and oriented x 3 today.   Neck- supple, no JVP Lymph- no cervical lymphadenopathy Lungs- Clear to ausculation bilaterally, normal work of breathing, incision with no hematoma Heart- Regular rate and rhythm (paced) GI- soft, NT, ND, + BS Extremities- no clubbing, cyanosis, or edema Skin- no rash or lesion, left chest without hematoma/ecchymosis Psych- euthymic mood, full affect Neuro- strength and sensation are intact  LABS: Basic Metabolic Panel:  Recent Labs  08/27/14 0730 08/27/14 0743  NA 138 139  K 4.0 4.0  CL 100 104  CO2 23  --   GLUCOSE 159* 160*  BUN 32* 34*  CREATININE 2.08* 1.90*  CALCIUM 9.6  --    Liver Function Tests:  Recent Labs  08/27/14 0730  AST 40*  ALT 32  ALKPHOS 56  BILITOT 1.6*  PROT 6.3  ALBUMIN 3.7   CBC:  Recent Labs  08/27/14 0730 08/27/14 0743  WBC 8.4  --   HGB 14.0 14.6  HCT 41.0 43.0  MCV 92.8  --   PLT 147*  --    Thyroid Function  Tests:  Recent Labs  08/27/14 0825  TSH 1.856    RADIOLOGY: Dg Chest Portable 1 View 08/27/2014   CLINICAL DATA:  Abdominal pain  EXAM: PORTABLE CHEST - 1 VIEW  COMPARISON:  09/03/2010  FINDINGS: Sharply defined ovoid opacity in the right mid lung.  There is chronic elevation of the right diaphragm which is likely postsurgical. Normal heart size and stable aortic tortuosity post cholecystectomy. No edema, free-flowing effusion, or pneumothorax. Skin fold noted on the left.  IMPRESSION: Fissural fluid or atelectasis on the right. Chest x-ray or CT follow-up in 4 to 6 weeks is recommended to evaluate for persistence/underlying pathology.   Electronically Signed   By: Monte Fantasia M.D.   On: 08/27/2014 07:57    ASSESSMENT AND PLAN:  Active Problems:   Atrial fibrillation with slow ventricular response   Symptomatic bradycardia   CHB (complete heart block)  1.  Complete heart block S/p single chamber pacemaker implantation 08/27/14 CXR and device interrogation reviewed and normal.  2.  Permanent atrial fibrillation CHADS2VASC score is at least 4 Resume Eliquis tomorrow  3.  HTN Stable No change required today  4.  CAD No recent ischemic symptoms Continue medical therapy  Will transfer to telemetry today and have PT ambulate patient.  Plan discharge tomorrow.   Chanetta Marshall, NP 08/28/2014 6:37 AM  EP Attending  Patient seen and examined. I agree with the findings above. I have reviewed the post implant interogation and his device is working normally. Ventress for discharge tomorrow. Transfer to tele today. Ambulate.  [name]

## 2014-08-28 NOTE — Discharge Instructions (Signed)
° ° °  Supplemental Discharge Instructions for  Pacemaker/Defibrillator Patients  Activity No heavy lifting or vigorous activity with your left/right arm for 6 to 8 weeks.  Do not raise your left/right arm above your head for one week.  Gradually raise your affected arm as drawn below.           __         08/31/14            09/01/14                      09/02/14                          09/03/14  NO DRIVING for 1 week    ; you may begin driving on   05/05/63  .  WOUND CARE - Keep the wound area clean and dry.  Do not get this area wet for one week. No showers for one week; you may shower on  09/03/14   . - The tape/steri-strips on your wound will fall off; do not pull them off.  No bandage is needed on the site.  DO  NOT apply any creams, oils, or ointments to the wound area. - If you notice any drainage or discharge from the wound, any swelling or bruising at the site, or you develop a fever > 101? F after you are discharged home, call the office at once.  Special Instructions - You are still able to use cellular telephones; use the ear opposite the side where you have your pacemaker/defibrillator.  Avoid carrying your cellular phone near your device. - When traveling through airports, show security personnel your identification card to avoid being screened in the metal detectors.  Ask the security personnel to use the hand wand. - Avoid arc welding equipment, MRI testing (magnetic resonance imaging), TENS units (transcutaneous nerve stimulators).  Call the office for questions about other devices. - Avoid electrical appliances that are in poor condition or are not properly grounded. - Microwave ovens are safe to be near or to operate.

## 2014-08-29 LAB — BASIC METABOLIC PANEL
Anion gap: 9 (ref 5–15)
BUN: 31 mg/dL — AB (ref 6–23)
CALCIUM: 9.1 mg/dL (ref 8.4–10.5)
CO2: 24 mmol/L (ref 19–32)
Chloride: 105 mmol/L (ref 96–112)
Creatinine, Ser: 1.54 mg/dL — ABNORMAL HIGH (ref 0.50–1.35)
GFR calc Af Amer: 45 mL/min — ABNORMAL LOW (ref >90)
GFR calc non Af Amer: 39 mL/min — ABNORMAL LOW (ref >90)
GLUCOSE: 98 mg/dL (ref 70–99)
Potassium: 4.3 mmol/L (ref 3.5–5.1)
Sodium: 138 mmol/L (ref 135–145)

## 2014-08-29 MED ORDER — FUROSEMIDE 10 MG/ML IJ SOLN
20.0000 mg | Freq: Once | INTRAMUSCULAR | Status: AC
  Administered 2014-08-29: 20 mg via INTRAVENOUS
  Filled 2014-08-29: qty 2

## 2014-08-29 NOTE — Progress Notes (Signed)
Physical Therapy Treatment Patient Details Name: Jesus Beck MRN: [id number] DOB: [birth year] Today's Date: 08/29/2014    History of Present Illness Pt adm with complete heart block and underwent pacer placement on 08/27/14. PMH - CABG, CAD, HTN    PT Comments    Progressing well.  Pt's response to being on RA was good, but unable to determine if pt maintaining in the 90's % due to cold fingers and no Pulse oximeter response.  Follow Up Recommendations  No PT follow up;Supervision for mobility/OOB     Equipment Recommendations  None recommended by PT    Recommendations for Other Services       Precautions / Restrictions Precautions Precautions: Fall;ICD/Pacemaker    Mobility  Bed Mobility Overal bed mobility: Needs Assistance Bed Mobility: Supine to Sit;Sit to Supine     Supine to sit: Supervision Sit to supine: Supervision      Transfers Overall transfer level: Needs assistance Equipment used: None Transfers: Sit to/from Stand Sit to Stand: Supervision         General transfer comment: generally steady transition  Ambulation/Gait Ambulation/Gait assistance: Supervision Ambulation Distance (Feet): 300 Feet Assistive device:  (pushed O2 caddie) Gait Pattern/deviations: Step-through pattern Gait velocity: moderate Gait velocity interpretation: Below normal speed for age/gender General Gait Details: periods of mild instability, but generally steady.  Unable to get even 1 sat reading due to cold hands.  Pt's response to fatigue good and color remained good.   Stairs            Wheelchair Mobility    Modified Rankin (Stroke Patients Only)       Balance Overall balance assessment: Needs assistance   Sitting balance-Leahy Scale: Normal     Standing balance support: No upper extremity supported Standing balance-Leahy Scale: Good                      Cognition Arousal/Alertness: Awake/alert Behavior During Therapy: WFL for tasks  assessed/performed Overall Cognitive Status: Within Functional Limits for tasks assessed                      Exercises General Exercises - Lower Extremity Hip ABduction/ADduction: AROM;Strengthening;Both;10 reps;Standing Hip Flexion/Marching: AROM;Strengthening;Both;10 reps;Standing Toe Raises: AAROM;5 reps;Standing (pt had difficulty clearing heels) Heel Raises: AROM;Both;10 reps;Standing Mini-Sqauts: AROM;Both;10 reps;Standing    General Comments        Pertinent Vitals/Pain Pain Assessment: No/denies pain    Home Living                      Prior Function            PT Goals (current goals can now be found in the care plan section) Acute Rehab PT Goals Patient Stated Goal: return home PT Goal Formulation: With patient Time For Goal Achievement: 09/04/14 Potential to Achieve Goals: Good Progress towards PT goals: Progressing toward goals    Frequency  Min 3X/week    PT Plan Current plan remains appropriate    Co-evaluation             End of Session Equipment Utilized During Treatment: Oxygen Activity Tolerance: Patient tolerated treatment well Patient left: in bed;with call bell/phone within reach;with family/visitor present     Time: 4132-4401 PT Time Calculation (min) (ACUTE ONLY): 30 min  Charges:  $Gait Training: 8-22 mins $Therapeutic Exercise: 8-22 mins  G Codes:      Micaella Gitto, Tessie Fass 08/29/2014, 2:40 PM  08/29/2014  Donnella Sham, [place] [phone]  (pager)

## 2014-08-29 NOTE — Progress Notes (Signed)
Pt educated about the need to use oxygen at home to maintain his oxygen saturation level,l but pt refused the oxygen. He said that he doesn't need it. He is also concerned about the cost.

## 2014-08-29 NOTE — Progress Notes (Signed)
SATURATION QUALIFICATIONS: (This note is used to comply with regulatory documentation for home oxygen)  Patient Saturations on Room Air at Rest = 92%  Patient Saturations on Room Air while Ambulating = 85 %  Patient Saturations on 2 Liters of oxygen while Ambulating = 99 %  Please briefly explain why patient needs home oxygen: pt saturation is fluctuating a lot, not sure about the reason, he will go from low sat to normal sometime without intervention then will drop again.   Ferdinand Lango, RN

## 2014-08-31 ENCOUNTER — Encounter: Payer: Self-pay | Admitting: Internal Medicine

## 2014-09-03 ENCOUNTER — Other Ambulatory Visit: Payer: Self-pay

## 2014-09-03 ENCOUNTER — Other Ambulatory Visit: Payer: Self-pay | Admitting: *Deleted

## 2014-09-03 MED ORDER — NITROGLYCERIN 0.4 MG SL SUBL: 0.4000 mg | SUBLINGUAL_TABLET | SUBLINGUAL | Status: DC | PRN

## 2014-09-10 ENCOUNTER — Ambulatory Visit: Payer: Self-pay

## 2014-09-14 ENCOUNTER — Ambulatory Visit (INDEPENDENT_AMBULATORY_CARE_PROVIDER_SITE_OTHER): Payer: Medicare Other | Admitting: *Deleted

## 2014-09-14 DIAGNOSIS — I480 Paroxysmal atrial fibrillation: Secondary | ICD-10-CM

## 2014-09-14 DIAGNOSIS — I442 Atrioventricular block, complete: Secondary | ICD-10-CM

## 2014-09-14 DIAGNOSIS — R001 Bradycardia, unspecified: Secondary | ICD-10-CM | POA: Diagnosis not present

## 2014-09-14 LAB — CUP PACEART INCLINIC DEVICE CHECK
Battery Impedance: 100 Ohm
Battery Remaining Longevity: 102 mo
Battery Voltage: 2.79 V
Brady Statistic RV Percent Paced: 91 %
Lead Channel Impedance Value: 0 Ohm
Lead Channel Impedance Value: 590 Ohm
Lead Channel Pacing Threshold Amplitude: 0.5 V
Lead Channel Pacing Threshold Pulse Width: 0.4 ms
Lead Channel Setting Sensing Sensitivity: 5.6 mV
MDC IDC SESS DTM: 20160513123314
MDC IDC SET LEADCHNL RV PACING AMPLITUDE: 3.5 V
MDC IDC SET LEADCHNL RV PACING PULSEWIDTH: 0.4 ms

## 2014-09-14 NOTE — Progress Notes (Signed)
Wound check appointment. Steri-strips removed. Wound without redness or edema. Incision edges approximated, wound well healed. Normal device function. Threshold, sensing, and impedances consistent with implant measurements. Device programmed at 3.5V programmed on for extra safety margin until 3 month visit. Histogram distribution appropriate for patient and level of activity. No high ventricular rates noted. Patient educated about wound care, arm mobility, lifting restrictions. ROV 11/30/14 @ 3pm with GT.

## 2014-09-21 ENCOUNTER — Other Ambulatory Visit: Payer: Self-pay | Admitting: Cardiology

## 2014-09-26 DIAGNOSIS — G4733 Obstructive sleep apnea (adult) (pediatric): Secondary | ICD-10-CM | POA: Diagnosis not present

## 2014-09-26 DIAGNOSIS — Z0001 Encounter for general adult medical examination with abnormal findings: Secondary | ICD-10-CM | POA: Diagnosis not present

## 2014-09-26 DIAGNOSIS — I10 Essential (primary) hypertension: Secondary | ICD-10-CM | POA: Diagnosis not present

## 2014-09-26 DIAGNOSIS — Z1389 Encounter for screening for other disorder: Secondary | ICD-10-CM | POA: Diagnosis not present

## 2014-09-26 DIAGNOSIS — M545 Low back pain: Secondary | ICD-10-CM | POA: Diagnosis not present

## 2014-09-26 DIAGNOSIS — Z950 Presence of cardiac pacemaker: Secondary | ICD-10-CM | POA: Diagnosis not present

## 2014-09-26 DIAGNOSIS — N183 Chronic kidney disease, stage 3 (moderate): Secondary | ICD-10-CM | POA: Diagnosis not present

## 2014-09-26 DIAGNOSIS — I482 Chronic atrial fibrillation: Secondary | ICD-10-CM | POA: Diagnosis not present

## 2014-09-26 DIAGNOSIS — E782 Mixed hyperlipidemia: Secondary | ICD-10-CM | POA: Diagnosis not present

## 2014-09-27 ENCOUNTER — Encounter: Payer: Self-pay | Admitting: Internal Medicine

## 2014-10-11 ENCOUNTER — Other Ambulatory Visit: Payer: Self-pay | Admitting: Cardiology

## 2014-10-25 ENCOUNTER — Encounter: Payer: Self-pay | Admitting: Cardiology

## 2014-10-31 DIAGNOSIS — R600 Localized edema: Secondary | ICD-10-CM | POA: Diagnosis not present

## 2014-11-26 ENCOUNTER — Encounter: Payer: Self-pay | Admitting: Internal Medicine

## 2014-11-26 ENCOUNTER — Ambulatory Visit (INDEPENDENT_AMBULATORY_CARE_PROVIDER_SITE_OTHER): Payer: Medicare Other | Admitting: Internal Medicine

## 2014-11-26 VITALS — BP 120/58 | HR 64 | Ht 70.0 in | Wt 157.6 lb

## 2014-11-26 DIAGNOSIS — I442 Atrioventricular block, complete: Secondary | ICD-10-CM

## 2014-11-26 DIAGNOSIS — Z950 Presence of cardiac pacemaker: Secondary | ICD-10-CM

## 2014-11-26 DIAGNOSIS — I4891 Unspecified atrial fibrillation: Secondary | ICD-10-CM | POA: Diagnosis not present

## 2014-11-26 DIAGNOSIS — I2510 Atherosclerotic heart disease of native coronary artery without angina pectoris: Secondary | ICD-10-CM

## 2014-11-26 DIAGNOSIS — I10 Essential (primary) hypertension: Secondary | ICD-10-CM

## 2014-11-26 LAB — CUP PACEART INCLINIC DEVICE CHECK
Battery Impedance: 134 Ohm
Battery Remaining Longevity: 114 mo
Battery Voltage: 2.79 V
Brady Statistic RV Percent Paced: 99 %
Lead Channel Pacing Threshold Amplitude: 0.5 V
Lead Channel Setting Pacing Amplitude: 2.5 V
Lead Channel Setting Pacing Pulse Width: 0.4 ms
Lead Channel Setting Sensing Sensitivity: 5.6 mV
MDC IDC MSMT LEADCHNL RA IMPEDANCE VALUE: 0 Ohm
MDC IDC MSMT LEADCHNL RV IMPEDANCE VALUE: 582 Ohm
MDC IDC MSMT LEADCHNL RV PACING THRESHOLD PULSEWIDTH: 0.4 ms
MDC IDC SESS DTM: 20160725154920

## 2014-11-26 NOTE — Patient Instructions (Signed)
Medication Instructions:  Your physician recommends that you continue on your current medications as directed. Please refer to the Current Medication list given to you today.   Labwork: NONE  Testing/Procedures: NONE  Follow-Up: Remote monitoring is used to monitor your Pacemaker of ICD from home. This monitoring reduces the number of office visits required to check your device to one time per year. It allows [place] to keep an eye on the functioning of your device to ensure it is working properly. You are scheduled for a device check from home on 02/25/2015. You may send your transmission at any time that day. If you have a wireless device, the transmission will be sent automatically. After your physician reviews your transmission, you will receive a postcard with your next transmission date.  Your physician wants you to follow-up in: 9 months with Dr. Lovena Le. You will receive a reminder letter in the mail two months in advance. If you don't receive a letter, please call our office to schedule the follow-up appointment.   Any Other Special Instructions Will Be Listed Below (If Applicable).

## 2014-11-26 NOTE — Progress Notes (Signed)
HPI Jesus Beck returns today for followup. He is a pleasant 81 yo man with a h/o atrial fibrillation and complete heart block, s/p PPM insertion, also with CAD, s/p CABG. In the interim he has done well with no chest pain or sob. He has returned to activity. No limitations. His exercise ability is much improved. No edema. Allergies  Allergen Reactions  . Lipitor [Atorvastatin] Hives     Current Outpatient Prescriptions  Medication Sig Dispense Refill  . amLODipine (NORVASC) 5 MG tablet TAKE 1 TABLET BY MOUTH EVERY DAY 90 tablet 0  . apixaban (ELIQUIS) 2.5 MG TABS tablet Take 1 tablet (2.5 mg total) by mouth 2 (two) times daily. 60 tablet 6  . furosemide (LASIX) 20 MG tablet Take 20 mg by mouth daily as needed for fluid or edema.    . isosorbide mononitrate (IMDUR) 60 MG 24 hr tablet TAKE 1.5 TABLETS BY MOUTH EVERY DAY 135 tablet 0  . losartan (COZAAR) 100 MG tablet TAKE 1 TABLET BY MOUTH EVERY DAY 90 tablet 0  . nitroGLYCERIN (NITROSTAT) 0.4 MG SL tablet Place 1 tablet (0.4 mg total) under the tongue every 5 (five) minutes as needed for chest pain. 25 tablet 3  . Omega-3 Fatty Acids (OMEGA 3 PO) Take 2,000 Units by mouth 2 (two) times daily.     . simvastatin (ZOCOR) 20 MG tablet TAKE 1 TABLET BY MOUTH EVERY NIGHT AT BEDTIME 90 tablet 0  . traMADol (ULTRAM) 50 MG tablet Take 50 mg by mouth daily as needed (2 tablets).      No current facility-administered medications for this visit.     Past Medical History  Diagnosis Date  . Hypercholesteremia   . HTN (hypertension)   . Back pain   . Atrial fibrillation     paroxysmal atrial fibrillation- stopped sotolol due to HR 44 (on 40mg  BID)  . Hypersomnia   . Dyslipidemia   . CAD (coronary artery disease)     s/p CABG 1999 with LIMA to MAD, SVG to first diagonal, SVG to RCA and PCI with DES mid LAD 06/09 and residual 70% D2 not amenable to PCI due to small caliber Cardiology Dr. Marlou Porch  . RBBB   . Carotid artery stenosis      s/p  bialteral CEA's 2004 with persistent bruit on left and minimal plaque by carotid dopplers 2010  . Renal insufficiency   . Atrial fibrillation     ROS:   All systems reviewed and negative except as noted in the HPI.   Past Surgical History  Procedure Laterality Date  . Cholecystectomy    . Coronary artery bypass graft    . Hernia repair    . Intraocular lens insertion    . Carotid endarterectomy Bilateral   . Permanent pacemaker insertion N/A 08/27/2014    Procedure: Jesus Beck WIRE;  Surgeon: Belva Crome, MD;  Location[facility] C[facility] LAB;  Service: Cardiovascular;  Laterality: N/A;  . Ep implantable device N/A 08/27/2014    Procedure: PACEMAKER IMPLANT;  Surgeon: Belva Crome, MD;  Location: [facility] CATH LAB;  Service: Cardiovascular;  Laterality: N/A;  . Permanent pacemaker insertion N/A 08/27/2014    Procedure: PERMANENT PACEMAKER INSERTION;  Surgeon: Evans Lance, MD;  Location: [facility] CATH LAB;  Service: Cardiovascular;  Laterality: N/A;     Family History  Problem Relation Age of Onset  . Diabetes Mother   . Heart disease Father      History   Social History  .  Marital Status: Widowed    Spouse Name: N/A  . Number of Children: N/A  . Years of Education: N/A   Occupational History  . Not on file.   Social History Main Topics  . Smoking status: Never Smoker   . Smokeless tobacco: Not on file  . Alcohol Use: No  . Drug Use: No  . Sexual Activity: Not on file   Other Topics Concern  . Not on file   Social History Narrative     BP 120/58 mmHg  Pulse 64  Ht 5\' 10"  (1.778 m)  Wt 157 lb 9.6 oz (71.487 kg)  BMI 22.61 kg/m2  SpO2 97%  Physical Exam:  Well appearing elderly man, NAD HEENT: Unremarkable Neck:  6 cm JVD, no thyromegally Back:  No CVA tenderness Lungs:  Clear with no wheezes HEART:  Regular rate rhythm, no murmurs, no rubs, no clicks Abd:  soft, positive bowel sounds, no organomegally, no rebound, no guarding Ext:  2 plus pulses, no edema, no cyanosis,  no clubbing Skin:  No rashes no nodules Neuro:  CN II through XII intact, motor grossly intact   DEVICE  Normal device function.  See PaceArt for details.   Assess/Plan:

## 2014-11-26 NOTE — Assessment & Plan Note (Signed)
His rate is very slow. He will continue his current meds including anticoagulation.

## 2014-11-26 NOTE — Assessment & Plan Note (Signed)
His blood pressure is well controlled. No change in meds.  

## 2014-11-26 NOTE — Assessment & Plan Note (Signed)
His medtronic single chamber PPM is working normally. Will recheck in several months.  

## 2014-11-28 DIAGNOSIS — H5212 Myopia, left eye: Secondary | ICD-10-CM | POA: Diagnosis not present

## 2014-11-28 DIAGNOSIS — H52223 Regular astigmatism, bilateral: Secondary | ICD-10-CM | POA: Diagnosis not present

## 2014-11-28 DIAGNOSIS — H524 Presbyopia: Secondary | ICD-10-CM | POA: Diagnosis not present

## 2014-11-28 DIAGNOSIS — H3530 Unspecified macular degeneration: Secondary | ICD-10-CM | POA: Diagnosis not present

## 2014-11-30 ENCOUNTER — Encounter: Payer: Self-pay | Admitting: Internal Medicine

## 2014-12-04 ENCOUNTER — Ambulatory Visit: Payer: Self-pay | Admitting: Internal Medicine

## 2014-12-12 ENCOUNTER — Other Ambulatory Visit: Payer: Self-pay | Admitting: Cardiology

## 2014-12-12 ENCOUNTER — Encounter: Payer: Self-pay | Admitting: Internal Medicine

## 2014-12-17 ENCOUNTER — Ambulatory Visit: Payer: Self-pay | Admitting: Cardiology

## 2015-01-01 ENCOUNTER — Ambulatory Visit (INDEPENDENT_AMBULATORY_CARE_PROVIDER_SITE_OTHER): Payer: Medicare Other | Admitting: Cardiology

## 2015-01-01 ENCOUNTER — Encounter: Payer: Self-pay | Admitting: Cardiology

## 2015-01-01 VITALS — BP 122/62 | HR 64 | Ht 71.0 in | Wt 156.4 lb

## 2015-01-01 DIAGNOSIS — R001 Bradycardia, unspecified: Secondary | ICD-10-CM

## 2015-01-01 DIAGNOSIS — I2510 Atherosclerotic heart disease of native coronary artery without angina pectoris: Secondary | ICD-10-CM

## 2015-01-01 DIAGNOSIS — I10 Essential (primary) hypertension: Secondary | ICD-10-CM

## 2015-01-01 DIAGNOSIS — I4891 Unspecified atrial fibrillation: Secondary | ICD-10-CM

## 2015-01-01 DIAGNOSIS — I4510 Unspecified right bundle-branch block: Secondary | ICD-10-CM | POA: Diagnosis not present

## 2015-01-01 DIAGNOSIS — Z950 Presence of cardiac pacemaker: Secondary | ICD-10-CM

## 2015-01-01 DIAGNOSIS — E7800 Pure hypercholesterolemia, unspecified: Secondary | ICD-10-CM

## 2015-01-01 DIAGNOSIS — I442 Atrioventricular block, complete: Secondary | ICD-10-CM

## 2015-01-01 DIAGNOSIS — I2583 Coronary atherosclerosis due to lipid rich plaque: Secondary | ICD-10-CM

## 2015-01-01 NOTE — Progress Notes (Signed)
[name] [address], [place]  [address] Phone: [phone] Fax:  [phone]  Date:  01/01/2015   ID:  Jesus Beck, DOB [birth year], MRN [id number]  PCP:  Kandice Hams, MD   History of Present Illness: Jesus Beck is a 81 y.o. male with coronary artery disease status post bypass in 1999 with hypertension, hyperlipidemia, OSA-noncompliant with mask, paroxysmal atrial fibrillation previously on sotalol.  [employer]. Gave me a pen- that his brother made.  He is currently asymptomatic in relation to his atrial fibrillation. He has been doing well, no syncope, no strokelike symptoms. We discussed at length risk of stroke, anticoagulation needs. Discussed with pharmacy.  Occasionally he would get ankle edema. He was placed on HCTZ for that. He did develop a rash along his chest wall and this medication was stopped several months ago. He saw dermatologist for the rash as well. Unsure what was the ultimate cause for the rash. I've asked him to stop his HCTZ.  Eliquis low dose started early December 2015. Doing well. Feels well.  Pacemaker for HR 20's. Feels much stronger. Legs weak, balance.    Wt Readings from Last 3 Encounters:  01/01/15 156 lb 6.4 oz (70.943 kg)  11/26/14 157 lb 9.6 oz (71.487 kg)  08/29/14 155 lb 12.8 oz (70.67 kg)     Past Medical History  Diagnosis Date  . Hypercholesteremia   . HTN (hypertension)   . Back pain   . Atrial fibrillation     paroxysmal atrial fibrillation- stopped sotolol due to HR 44 (on 40mg  BID)  . Hypersomnia   . Dyslipidemia   . CAD (coronary artery disease)     s/p CABG 1999 with LIMA to MAD, SVG to first diagonal, SVG to RCA and PCI with DES mid LAD 06/09 and residual 70% D2 not amenable to PCI due to small caliber Cardiology Dr. Marlou Porch  . RBBB   . Carotid artery stenosis      s/p bialteral CEA's 2004 with persistent bruit on left and minimal plaque by carotid dopplers 2010  . Renal insufficiency   .  Atrial fibrillation     Past Surgical History  Procedure Laterality Date  . Cholecystectomy    . Coronary artery bypass graft    . Hernia repair    . Intraocular lens insertion    . Carotid endarterectomy Bilateral   . Permanent pacemaker insertion N/A 08/27/2014    Procedure: Earlean Polka WIRE;  Surgeon: Belva Crome, MD;  Location: [facility] CATH LAB;  Service: Cardiovascular;  Laterality: N/A;  . Ep implantable device N/A 08/27/2014    Procedure: PACEMAKER IMPLANT;  Surgeon: Belva Crome, MD;  Location: [facility] CATH LAB;  Service: Cardiovascular;  Laterality: N/A;  . Permanent pacemaker insertion N/A 08/27/2014    Procedure: PERMANENT PACEMAKER INSERTION;  Surgeon: Evans Lance, MD;  Location: [facility] CATH LAB;  Service: Cardiovascular;  Laterality: N/A;    Current Outpatient Prescriptions  Medication Sig Dispense Refill  . amLODipine (NORVASC) 5 MG tablet TAKE 1 TABLET BY MOUTH EVERY DAY 90 tablet 0  . amLODipine (NORVASC) 5 MG tablet TAKE 1 TABLET BY MOUTH EVERY DAY 90 tablet 0  . apixaban (ELIQUIS) 2.5 MG TABS tablet Take 1 tablet (2.5 mg total) by mouth 2 (two) times daily. 60 tablet 6  . furosemide (LASIX) 20 MG tablet Take 20 mg by mouth daily as needed for fluid or edema.    . isosorbide mononitrate (IMDUR)  60 MG 24 hr tablet TAKE 1.5 TABLETS BY MOUTH EVERY DAY 135 tablet 0  . losartan (COZAAR) 100 MG tablet TAKE 1 TABLET BY MOUTH EVERY DAY 90 tablet 0  . nitroGLYCERIN (NITROSTAT) 0.4 MG SL tablet Place 1 tablet (0.4 mg total) under the tongue every 5 (five) minutes as needed for chest pain. 25 tablet 3  . Omega-3 Fatty Acids (OMEGA 3 PO) Take 2,000 Units by mouth 2 (two) times daily.     . simvastatin (ZOCOR) 20 MG tablet TAKE 1 TABLET BY MOUTH EVERY NIGHT AT BEDTIME 90 tablet 0  . traMADol (ULTRAM) 50 MG tablet Take 50 mg by mouth daily as needed (2 tablets).      No current facility-administered medications for this visit.    Allergies:    Allergies  Allergen Reactions  . Lipitor  [Atorvastatin] Hives    Social History:  The patient  reports that he has never smoked. He does not have any smokeless tobacco history on file. He reports that he does not drink alcohol or use illicit drugs.   ROS:  Please see the history of present illness.   Denies any bleeding, syncope, orthopnea, PND. Low back pain. No syncope, no CP, no SOB.   PHYSICAL EXAM: VS:  BP 122/62 mmHg  Pulse 64  Ht 5\' 11"  (1.803 m)  Wt 156 lb 6.4 oz (70.943 kg)  BMI 21.82 kg/m2 Well nourished, well developed, in no acute distress HEENT: normal Neck: no JVD Cardiac:  RRR, no murmur(pacer) Lungs:  clear to auscultation bilaterally, no wheezing, rhonchi or rales Abd: soft, nontender, no hepatomegaly Ext: trace edema right, compression hose not on today Skin: warm and dry Neuro: no focal abnormalities noted  EKG:  Today 01/01/15 - V paced. Personally viewed. Prior- Atrial fibrillation heart rate 87 with right bundle branch block , left posterior fascicular block    ASSESSMENT AND PLAN:  1. Symptomatically bradycardia/atrial fibrillation with slow ventricular response heart rate in the 20s--pacemaker placed on 08/27/14 by Dr. Lovena Le. Single-chamber pacemaker, Medtronic placed. Doing very well. 2. Persistent atrial fibrillation-was previously on sotalol. Atrial fibrillation noted on prior EKG. Currently well rate controlled. 3. Chronic anticoagulation- Eliquis 2.5 mg twice a day for about a month. No bleeding. Overall doing well. Dr. Delfina Redwood recently checked blood work. Since he has been taking it, feels better.  4. Coronary artery disease-prior CABG 5. Carotid artery disease-prior endarterectomy 6. Obstructive sleep apnea -- unfortunately did not tolerate the mask well. 7. Hypertension-currently doing well.(minimally elevated). Continue with current meds.  8. Hyperlipidemia-continue with simvastatin 9. Mild ankle edema-compression hose, elevation. 10. We will see back in 6 months  Signed, Candee Furbish, MD  [facility]  01/01/2015 9:44 AM

## 2015-01-01 NOTE — Patient Instructions (Signed)
Medication Instructions:  Your physician recommends that you continue on your current medications as directed. Please refer to the Current Medication list given to you today.  Follow-Up: Follow up in 6 months with Dr. Skains.  You will receive a letter in the mail 2 months before you are due.  Please call us when you receive this letter to schedule your follow up appointment.  Thank you for choosing [facility]!!     

## 2015-01-02 ENCOUNTER — Other Ambulatory Visit: Payer: Self-pay | Admitting: *Deleted

## 2015-01-02 MED ORDER — ISOSORBIDE MONONITRATE ER 60 MG PO TB24: ORAL_TABLET | ORAL | Status: DC

## 2015-01-10 ENCOUNTER — Other Ambulatory Visit: Payer: Self-pay | Admitting: Cardiology

## 2015-02-25 ENCOUNTER — Ambulatory Visit (INDEPENDENT_AMBULATORY_CARE_PROVIDER_SITE_OTHER): Payer: Medicare Other | Admitting: *Deleted

## 2015-02-25 DIAGNOSIS — I442 Atrioventricular block, complete: Secondary | ICD-10-CM | POA: Diagnosis not present

## 2015-02-26 LAB — CUP PACEART REMOTE DEVICE CHECK
Battery Impedance: 135 Ohm
Battery Voltage: 2.78 V
Brady Statistic RV Percent Paced: 99 %
Implantable Lead Location: 753860
Implantable Lead Model: 5076
Lead Channel Impedance Value: 0 Ohm
Lead Channel Impedance Value: 569 Ohm
Lead Channel Pacing Threshold Amplitude: 0.5 V
Lead Channel Setting Pacing Amplitude: 2.25 V
Lead Channel Setting Pacing Pulse Width: 0.4 ms
Lead Channel Setting Sensing Sensitivity: 5.6 mV
MDC IDC LEAD IMPLANT DT: 20160425
MDC IDC MSMT BATTERY REMAINING LONGEVITY: 114 mo
MDC IDC MSMT LEADCHNL RV PACING THRESHOLD PULSEWIDTH: 0.4 ms
MDC IDC SESS DTM: 20161024132743

## 2015-02-26 NOTE — Progress Notes (Signed)
Remote pacemaker transmission.   

## 2015-02-27 ENCOUNTER — Encounter: Payer: Self-pay | Admitting: Cardiology

## 2015-03-06 ENCOUNTER — Other Ambulatory Visit: Payer: Self-pay | Admitting: Cardiology

## 2015-03-11 DIAGNOSIS — Z23 Encounter for immunization: Secondary | ICD-10-CM | POA: Diagnosis not present

## 2015-03-21 DIAGNOSIS — R600 Localized edema: Secondary | ICD-10-CM | POA: Diagnosis not present

## 2015-03-21 DIAGNOSIS — G4733 Obstructive sleep apnea (adult) (pediatric): Secondary | ICD-10-CM | POA: Diagnosis not present

## 2015-03-21 DIAGNOSIS — E782 Mixed hyperlipidemia: Secondary | ICD-10-CM | POA: Diagnosis not present

## 2015-03-21 DIAGNOSIS — I10 Essential (primary) hypertension: Secondary | ICD-10-CM | POA: Diagnosis not present

## 2015-03-21 DIAGNOSIS — I482 Chronic atrial fibrillation: Secondary | ICD-10-CM | POA: Diagnosis not present

## 2015-03-21 DIAGNOSIS — N183 Chronic kidney disease, stage 3 (moderate): Secondary | ICD-10-CM | POA: Diagnosis not present

## 2015-03-21 DIAGNOSIS — M545 Low back pain: Secondary | ICD-10-CM | POA: Diagnosis not present

## 2015-04-01 ENCOUNTER — Other Ambulatory Visit: Payer: Self-pay

## 2015-04-01 MED ORDER — APIXABAN 2.5 MG PO TABS: 2.5000 mg | ORAL_TABLET | Freq: Two times a day (BID) | ORAL | Status: DC

## 2015-04-04 ENCOUNTER — Telehealth: Payer: Self-pay

## 2015-04-04 NOTE — Telephone Encounter (Signed)
Refill request form for Eliquis 2.5mg  faxed to [place].

## 2015-04-05 DIAGNOSIS — M5432 Sciatica, left side: Secondary | ICD-10-CM | POA: Diagnosis not present

## 2015-04-10 ENCOUNTER — Other Ambulatory Visit: Payer: Self-pay | Admitting: *Deleted

## 2015-04-10 ENCOUNTER — Telehealth: Payer: Self-pay | Admitting: Cardiology

## 2015-04-10 MED ORDER — LOSARTAN POTASSIUM 100 MG PO TABS: 100.0000 mg | ORAL_TABLET | Freq: Every day | ORAL | Status: DC

## 2015-04-10 NOTE — Telephone Encounter (Signed)
New message       *STAT* If patient is at the pharmacy, call can be transferred to refill team.   1. Which medications need to be refilled? (please list name of each medication and dose if known) losartan 100mg  2. Which pharmacy/location (including street and city if local pharmacy) is medication to be sent to? [email or web address]  3. Do they need a 30 day or 90 day supply? 90 day supply

## 2015-04-10 NOTE — Telephone Encounter (Signed)
Rx sent in

## 2015-04-17 ENCOUNTER — Telehealth: Payer: Self-pay | Admitting: Cardiology

## 2015-04-17 NOTE — Telephone Encounter (Signed)
Walk in pt form-Patient Assistance Form-dropped off Pam back Thursday 12/15 will give to her then.

## 2015-05-01 DIAGNOSIS — M1990 Unspecified osteoarthritis, unspecified site: Secondary | ICD-10-CM | POA: Diagnosis not present

## 2015-05-02 ENCOUNTER — Telehealth: Payer: Self-pay | Admitting: Cardiology

## 2015-05-02 NOTE — Telephone Encounter (Signed)
[employer] Form signed and completed by scanned in and mailed back to patients home address.

## 2015-05-10 DIAGNOSIS — M79671 Pain in right foot: Secondary | ICD-10-CM | POA: Diagnosis not present

## 2015-05-27 ENCOUNTER — Encounter: Payer: Medicare Other | Admitting: *Deleted

## 2015-05-29 ENCOUNTER — Encounter: Payer: Self-pay | Admitting: Cardiology

## 2015-06-03 ENCOUNTER — Telehealth: Payer: Self-pay | Admitting: Internal Medicine

## 2015-06-03 ENCOUNTER — Ambulatory Visit (INDEPENDENT_AMBULATORY_CARE_PROVIDER_SITE_OTHER): Payer: Medicare Other | Admitting: *Deleted

## 2015-06-03 DIAGNOSIS — I442 Atrioventricular block, complete: Secondary | ICD-10-CM

## 2015-06-03 NOTE — Telephone Encounter (Signed)
Spoke w/ pt and informed him that his remote transmission was received.  

## 2015-06-03 NOTE — Telephone Encounter (Signed)
°  1. Has your device fired? no ° °2. Is you device beeping? no ° °3. Are you experiencing draining or swelling at device site? no ° °4. Are you calling to see if we received your device transmission? yes ° °5. Have you passed out? no ° °

## 2015-06-05 NOTE — Progress Notes (Signed)
Remote pacemaker transmission.   

## 2015-06-07 DIAGNOSIS — R21 Rash and other nonspecific skin eruption: Secondary | ICD-10-CM | POA: Diagnosis not present

## 2015-06-07 DIAGNOSIS — M2040 Other hammer toe(s) (acquired), unspecified foot: Secondary | ICD-10-CM | POA: Diagnosis not present

## 2015-06-11 LAB — CUP PACEART REMOTE DEVICE CHECK
Battery Impedance: 159 Ohm
Implantable Lead Model: 5076
Lead Channel Impedance Value: 0 Ohm
Lead Channel Impedance Value: 546 Ohm
Lead Channel Pacing Threshold Amplitude: 0.5 V
Lead Channel Pacing Threshold Pulse Width: 0.4 ms
Lead Channel Setting Pacing Pulse Width: 0.4 ms
Lead Channel Setting Sensing Sensitivity: 5.6 mV
MDC IDC LEAD IMPLANT DT: 20160425
MDC IDC LEAD LOCATION: 753860
MDC IDC MSMT BATTERY REMAINING LONGEVITY: 110 mo
MDC IDC MSMT BATTERY VOLTAGE: 2.79 V
MDC IDC SESS DTM: 20170130185805
MDC IDC SET LEADCHNL RV PACING AMPLITUDE: 2.25 V
MDC IDC STAT BRADY RV PERCENT PACED: 98 %

## 2015-06-14 ENCOUNTER — Encounter: Payer: Self-pay | Admitting: Cardiology

## 2015-06-27 ENCOUNTER — Other Ambulatory Visit: Payer: Self-pay | Admitting: Cardiology

## 2015-07-08 ENCOUNTER — Encounter: Payer: Self-pay | Admitting: Cardiology

## 2015-07-08 ENCOUNTER — Telehealth: Payer: Self-pay

## 2015-07-08 ENCOUNTER — Other Ambulatory Visit: Payer: Self-pay

## 2015-07-08 ENCOUNTER — Ambulatory Visit (INDEPENDENT_AMBULATORY_CARE_PROVIDER_SITE_OTHER): Payer: Medicare Other | Admitting: Cardiology

## 2015-07-08 VITALS — BP 120/62 | HR 73 | Ht 69.0 in | Wt 162.0 lb

## 2015-07-08 DIAGNOSIS — I442 Atrioventricular block, complete: Secondary | ICD-10-CM

## 2015-07-08 DIAGNOSIS — I10 Essential (primary) hypertension: Secondary | ICD-10-CM | POA: Diagnosis not present

## 2015-07-08 DIAGNOSIS — I2510 Atherosclerotic heart disease of native coronary artery without angina pectoris: Secondary | ICD-10-CM | POA: Diagnosis not present

## 2015-07-08 DIAGNOSIS — I6523 Occlusion and stenosis of bilateral carotid arteries: Secondary | ICD-10-CM

## 2015-07-08 DIAGNOSIS — I4891 Unspecified atrial fibrillation: Secondary | ICD-10-CM

## 2015-07-08 DIAGNOSIS — Z950 Presence of cardiac pacemaker: Secondary | ICD-10-CM | POA: Diagnosis not present

## 2015-07-08 DIAGNOSIS — I208 Other forms of angina pectoris: Secondary | ICD-10-CM | POA: Diagnosis not present

## 2015-07-08 DIAGNOSIS — I2583 Coronary atherosclerosis due to lipid rich plaque: Principal | ICD-10-CM

## 2015-07-08 MED ORDER — APIXABAN 2.5 MG PO TABS: 2.5000 mg | ORAL_TABLET | Freq: Two times a day (BID) | ORAL | Status: DC

## 2015-07-08 NOTE — Telephone Encounter (Signed)
Patient Assistance forms for Eliquis 2.5 mg faxed to [employer].

## 2015-07-08 NOTE — Progress Notes (Addendum)
[place]. [address], [address], [place]  [address] Phone: [phone] Fax:  [phone]  Date:  07/08/2015   ID:  Jesus Beck, DOB 04-22-1926, MRN [id number]  PCP:  Kandice Hams, MD   History of Present Illness: Jesus Beck is a 82 y.o. male with coronary artery disease status post CABG in 1999 with hypertension, hyperlipidemia, OSA-noncompliant with mask, paroxysmal atrial fibrillation previously on sotalol.  [employer]. Gave me a pen- that his brother made.  He is currently asymptomatic in relation to his atrial fibrillation. He has been doing well, no syncope, no strokelike symptoms. We discussed at length risk of stroke, anticoagulation needs. Discussed with pharmacy.  Occasionally he would get ankle edema. He was placed on HCTZ for that. He did develop a rash along his chest wall and this medication was stopped several months ago. He saw dermatologist for the rash as well. Unsure what was the ultimate cause for the rash. I've asked him to stop his HCTZ in the past.  Eliquis low dose started early December 2015. Doing well. Feels well.  Pacemaker for HR 20's. Feels much stronger. Since pacer doing well.  Legs weak, balance. No falls. Right leg wose with edema than left. Now takes lasix.    Wt Readings from Last 3 Encounters:  07/08/15 162 lb (73.483 kg)  01/01/15 156 lb 6.4 oz (70.943 kg)  11/26/14 157 lb 9.6 oz (71.487 kg)     Past Medical History  Diagnosis Date  . Hypercholesteremia   . HTN (hypertension)   . Back pain   . Atrial fibrillation (HCC)     paroxysmal atrial fibrillation- stopped sotolol due to HR 44 (on 40mg  BID)  . Hypersomnia   . Dyslipidemia   . CAD (coronary artery disease)     s/p CABG 1999 with LIMA to MAD, SVG to first diagonal, SVG to RCA and PCI with DES mid LAD 06/09 and residual 70% D2 not amenable to PCI due to small caliber Cardiology Dr. Marlou Porch  . RBBB   . Carotid artery stenosis      s/p bialteral CEA's 2004 with  persistent bruit on left and minimal plaque by carotid dopplers 2010  . Renal insufficiency   . Atrial fibrillation [facility])     Past Surgical History  Procedure Laterality Date  . Cholecystectomy    . Coronary artery bypass graft    . Hernia repair    . Intraocular lens insertion    . Carotid endarterectomy Bilateral   . Permanent pacemaker insertion N/A 08/27/2014    Procedure: Earlean Polka WIRE;  Surgeon: Belva Crome, MD;  [facility];  Service: Cardiovascular;  Laterality: N/A;  . Ep implantable device N/A 08/27/2014    Procedure: PACEMAKER IMPLANT;  Surgeon: Belva Crome, MD;  Location: [facility] CATH LAB;  Service: Cardiovascular;  Laterality: N/A;  . Permanent pacemaker insertion N/A 08/27/2014    Procedure: PERMANENT PACEMAKER INSERTION;  Surgeon: Evans Lance, MD;  Location: [facility] CATH LAB;  Service: Cardiovascular;  Laterality: N/A;    Current Outpatient Prescriptions  Medication Sig Dispense Refill  . amLODipine (NORVASC) 5 MG tablet TAKE 1 TABLET BY MOUTH EVERY DAY 90 tablet 2  . apixaban (ELIQUIS) 2.5 MG TABS tablet Take 1 tablet (2.5 mg total) by mouth 2 (two) times daily. 180 tablet 3  . furosemide (LASIX) 20 MG tablet Take 20 mg by mouth daily as needed for fluid or edema.    Marland Kitchen  isosorbide mononitrate (IMDUR) 60 MG 24 hr tablet TAKE 1 AND 1/2 TABLETS BY MOUTH EVERY DAY 135 tablet 0  . losartan (COZAAR) 100 MG tablet Take 1 tablet (100 mg total) by mouth daily. 90 tablet 2  . nitroGLYCERIN (NITROSTAT) 0.4 MG SL tablet Place 1 tablet (0.4 mg total) under the tongue every 5 (five) minutes as needed for chest pain. 25 tablet 3  . Omega-3 Fatty Acids (OMEGA 3 PO) Take 2,000 Units by mouth 2 (two) times daily.     . simvastatin (ZOCOR) 20 MG tablet TAKE 1 TABLET BY MOUTH EVERY NIGHT AT BEDTIME 90 tablet 0  . traMADol (ULTRAM) 50 MG tablet Take 50 mg by mouth daily as needed (2 tablets).      No current facility-administered medications for this visit.    Allergies:    Allergies    Allergen Reactions  . Lipitor [Atorvastatin] Hives    Social History:  The patient  reports that he has never smoked. He does not have any smokeless tobacco history on file. He reports that he does not drink alcohol or use illicit drugs.   ROS:  Please see the history of present illness.   Denies any bleeding, syncope, orthopnea, PND. Low back pain. No syncope, no CP, no SOB.   PHYSICAL EXAM: VS:  BP 120/62 mmHg  Pulse 73  Ht 5\' 9"  (1.753 m)  Wt 162 lb (73.483 kg)  BMI 23.91 kg/m2  SpO2 93% Well nourished, well developed, in no acute distress HEENT: normal Neck: no JVD Cardiac:  RRR, no murmur(pacer) Lungs:  clear to auscultation bilaterally, no wheezing, rhonchi or rales Abd: soft, nontender, no hepatomegaly Ext: 1-2+edema right, compression hose not on today, left trace Skin: warm and dry Neuro: no focal abnormalities noted  EKG:   Previous 01/01/15 - V paced. Personally viewed. Prior- Atrial fibrillation heart rate 87 with right bundle branch block , left posterior fascicular block     labs reviewed from Dr. Lina Sar office.  ASSESSMENT AND PLAN:  1. Symptomatic bradycardia/atrial fibrillation with slow ventricular response heart rate in the 20s--pacemaker placed on 08/27/14 by Dr. Lovena Le. Single-chamber pacemaker, Medtronic placed. Doing very well. He feels much better with his pacemaker. 2. Persistent atrial fibrillation-was previously on sotalol. Atrial fibrillation noted on prior EKG. Currently well rate controlled. No longer on sotalol. Doing well. 3. Chronic anticoagulation- Eliquis 2.5 mg twice a day for about a month. No bleeding. Overall doing well. Dr. Delfina Redwood recently checked blood work. Since he has been taking it, feels better.  On lower dose because of creatinine greater than 1.5 and age greater than 80 4. Coronary artery disease-prior CABG , isosorbide for antianginal, doing well 5.  angina- symptoms well-controlled post bypass as well as isosorbide 6. Carotid  artery disease-prior endarterectomy 7. Obstructive sleep apnea -- unfortunately did not tolerate the mask well. 8. Hypertension-currently doing well. Continue with current meds.  I contemplated decreasing his losartan but at previous visit he had been minimally elevated. 9. Hyperlipidemia-continue with simvastatin 10. Mild ankle edema-compression hose, elevation. Right greater than left 11. We will see back in 6 months  Signed, Candee Furbish, MD [facility]  07/08/2015 11:02 AM

## 2015-07-08 NOTE — Patient Instructions (Signed)

## 2015-08-05 DIAGNOSIS — R21 Rash and other nonspecific skin eruption: Secondary | ICD-10-CM | POA: Diagnosis not present

## 2015-08-05 DIAGNOSIS — M545 Low back pain: Secondary | ICD-10-CM | POA: Diagnosis not present

## 2015-08-14 ENCOUNTER — Other Ambulatory Visit: Payer: Self-pay

## 2015-08-14 MED ORDER — APIXABAN 2.5 MG PO TABS: 2.5000 mg | ORAL_TABLET | Freq: Two times a day (BID) | ORAL | Status: DC

## 2015-08-23 DIAGNOSIS — R21 Rash and other nonspecific skin eruption: Secondary | ICD-10-CM | POA: Diagnosis not present

## 2015-08-23 DIAGNOSIS — D72829 Elevated white blood cell count, unspecified: Secondary | ICD-10-CM | POA: Diagnosis not present

## 2015-08-27 DIAGNOSIS — L03115 Cellulitis of right lower limb: Secondary | ICD-10-CM | POA: Diagnosis not present

## 2015-09-01 ENCOUNTER — Other Ambulatory Visit: Payer: Self-pay | Admitting: Cardiology

## 2015-09-03 DIAGNOSIS — L03115 Cellulitis of right lower limb: Secondary | ICD-10-CM | POA: Diagnosis not present

## 2015-09-20 ENCOUNTER — Other Ambulatory Visit: Payer: Self-pay | Admitting: Cardiology

## 2015-10-01 DIAGNOSIS — E7800 Pure hypercholesterolemia, unspecified: Secondary | ICD-10-CM | POA: Diagnosis not present

## 2015-10-01 DIAGNOSIS — G8929 Other chronic pain: Secondary | ICD-10-CM | POA: Diagnosis not present

## 2015-10-01 DIAGNOSIS — Z950 Presence of cardiac pacemaker: Secondary | ICD-10-CM | POA: Diagnosis not present

## 2015-10-01 DIAGNOSIS — I482 Chronic atrial fibrillation: Secondary | ICD-10-CM | POA: Diagnosis not present

## 2015-10-01 DIAGNOSIS — N183 Chronic kidney disease, stage 3 (moderate): Secondary | ICD-10-CM | POA: Diagnosis not present

## 2015-10-01 DIAGNOSIS — Z0000 Encounter for general adult medical examination without abnormal findings: Secondary | ICD-10-CM | POA: Diagnosis not present

## 2015-10-01 DIAGNOSIS — Z1389 Encounter for screening for other disorder: Secondary | ICD-10-CM | POA: Diagnosis not present

## 2015-10-01 DIAGNOSIS — I10 Essential (primary) hypertension: Secondary | ICD-10-CM | POA: Diagnosis not present

## 2015-10-08 ENCOUNTER — Encounter: Payer: Self-pay | Admitting: Internal Medicine

## 2015-10-08 ENCOUNTER — Ambulatory Visit (INDEPENDENT_AMBULATORY_CARE_PROVIDER_SITE_OTHER): Payer: Medicare Other | Admitting: Internal Medicine

## 2015-10-08 VITALS — BP 104/62 | HR 78 | Ht 68.0 in | Wt 153.8 lb

## 2015-10-08 DIAGNOSIS — I482 Chronic atrial fibrillation, unspecified: Secondary | ICD-10-CM

## 2015-10-08 DIAGNOSIS — I442 Atrioventricular block, complete: Secondary | ICD-10-CM

## 2015-10-08 DIAGNOSIS — I208 Other forms of angina pectoris: Secondary | ICD-10-CM | POA: Diagnosis not present

## 2015-10-08 LAB — CUP PACEART INCLINIC DEVICE CHECK
Battery Impedance: 207 Ohm
Battery Remaining Longevity: 104 mo
Battery Voltage: 2.77 V
Brady Statistic RV Percent Paced: 98 %
Date Time Interrogation Session: 20170606170734
Implantable Lead Implant Date: 20160425
Implantable Lead Location: 753860
Implantable Lead Model: 5076
Lead Channel Pacing Threshold Pulse Width: 0.4 ms
Lead Channel Setting Pacing Amplitude: 2.25 V
MDC IDC MSMT LEADCHNL RA IMPEDANCE VALUE: 0 Ohm
MDC IDC MSMT LEADCHNL RV IMPEDANCE VALUE: 590 Ohm
MDC IDC MSMT LEADCHNL RV PACING THRESHOLD AMPLITUDE: 0.5 V
MDC IDC SET LEADCHNL RV PACING PULSEWIDTH: 0.4 ms
MDC IDC SET LEADCHNL RV SENSING SENSITIVITY: 5.6 mV

## 2015-10-08 NOTE — Patient Instructions (Addendum)
Medication Instructions:  Your physician recommends that you continue on your current medications as directed. Please refer to the Current Medication list given to you today.   Labwork: None ordered   Testing/Procedures: None ordered   Follow-Up: Your physician wants you to follow-up in: 12 months with Dr. Lovena Le. You will receive a reminder letter in the mail two months in advance. If you don't receive a letter, please call our office to schedule the follow-up appointment.  Remote monitoring is used to monitor your Pacemaker from home. This monitoring reduces the number of office visits required to check your device to one time per year. It allows [place] to keep an eye on the functioning of your device to ensure it is working properly. You are scheduled for a device check from home on 01/07/16. You may send your transmission at any time that day. If you have a wireless device, the transmission will be sent automatically. After your physician reviews your transmission, you will receive a postcard with your next transmission date.   Any Other Special Instructions Will Be Listed Below (If Applicable).    If you need a refill on your cardiac medications before your next appointment, please call your pharmacy.

## 2015-10-08 NOTE — Progress Notes (Signed)
HPI Jesus Beck returns today for followup. He is a pleasant 82 yo man with a h/o atrial fibrillation and complete heart block, s/p PPM insertion, also with CAD, s/p CABG. In the interim he has done well with no chest pain or sob. He has returned to activity. No limitations. His exercise ability is much improved. No edema. This morning he notes that he worked for 2 hours in his garden. Allergies  Allergen Reactions  . Lipitor [Atorvastatin] Hives     Current Outpatient Prescriptions  Medication Sig Dispense Refill  . amLODipine (NORVASC) 5 MG tablet TAKE 1 TABLET BY MOUTH EVERY DAY 90 tablet 2  . apixaban (ELIQUIS) 2.5 MG TABS tablet Take 1 tablet (2.5 mg total) by mouth 2 (two) times daily. 30 tablet 0  . furosemide (LASIX) 20 MG tablet Take 20 mg by mouth daily as needed for fluid or edema.    . isosorbide mononitrate (IMDUR) 60 MG 24 hr tablet TAKE 1 AND 1/2 TABLETS BY MOUTH EVERY DAY 135 tablet 2  . losartan (COZAAR) 100 MG tablet Take 1 tablet (100 mg total) by mouth daily. 90 tablet 2  . nitroGLYCERIN (NITROSTAT) 0.4 MG SL tablet Place 1 tablet (0.4 mg total) under the tongue every 5 (five) minutes as needed for chest pain. 25 tablet 3  . Omega-3 Fatty Acids (OMEGA 3 PO) Take 2,000 Units by mouth 2 (two) times daily.     . simvastatin (ZOCOR) 20 MG tablet TAKE 1 TABLET BY MOUTH EVERY NIGHT AT BEDTIME 90 tablet 0  . traMADol (ULTRAM) 50 MG tablet Take 50 mg by mouth daily as needed (pain).     No current facility-administered medications for this visit.     Past Medical History  Diagnosis Date  . Hypercholesteremia   . HTN (hypertension)   . Back pain   . Atrial fibrillation (HCC)     paroxysmal atrial fibrillation- stopped sotolol due to HR 44 (on 40mg  BID)  . Hypersomnia   . Dyslipidemia   . CAD (coronary artery disease)     s/p CABG 1999 with LIMA to MAD, SVG to first diagonal, SVG to RCA and PCI with DES mid LAD 06/09 and residual 70% D2 not amenable to PCI due to  small caliber Cardiology Dr. Marlou Porch  . RBBB   . Carotid artery stenosis      s/p bialteral CEA's 2004 with persistent bruit on left and minimal plaque by carotid dopplers 2010  . Renal insufficiency   . Atrial fibrillation ([place])     ROS:   All systems reviewed and negative except as noted in the HPI.   Past Surgical History  Procedure Laterality Date  . Cholecystectomy    . Coronary artery bypass graft    . Hernia repair    . Intraocular lens insertion    . Carotid endarterectomy Bilateral   . Permanent pacemaker insertion N/A 08/27/2014    Procedure: Earlean Polka WIRE;  Surgeon: Belva Crome, MD;  Location: [facility] CATH LAB;  Service: Cardiovascular;  Laterality: N/A;  . Ep implantable device N/A 08/27/2014    Procedure: PACEMAKER IMPLANT;  Surgeon: Belva Crome, MD;  Location: [facility] CATH LAB;  Service: Cardiovascular;  Laterality: N/A;  . Permanent pacemaker insertion N/A 08/27/2014    Procedure: PERMANENT PACEMAKER INSERTION;  Surgeon: Evans Lance, MD;  Location: [facility] CATH LAB;  Service: Cardiovascular;  Laterality: N/A;     Family History  Problem Relation Age of Onset  . Diabetes Mother   .  Heart disease Father      Social History   Social History  . Marital Status: Widowed    Spouse Name: N/A  . Number of Children: N/A  . Years of Education: N/A   Occupational History  . Not on file.   Social History Main Topics  . Smoking status: Never Smoker   . Smokeless tobacco: Not on file  . Alcohol Use: No  . Drug Use: No  . Sexual Activity: Not on file   Other Topics Concern  . Not on file   Social History Narrative     BP 104/62 mmHg  Pulse 78  Ht 5\' 8"  (1.727 m)  Wt 153 lb 12.8 oz (69.763 kg)  BMI 23.39 kg/m2  SpO2 98%  Physical Exam:  Well appearing elderly man, NAD HEENT: Unremarkable Neck:  6 cm JVD, no thyromegally Back:  No CVA tenderness Lungs:  Clear with no wheezes HEART:  Regular rate rhythm, no murmurs, no rubs, no clicks Abd:  soft, positive bowel  sounds, no organomegally, no rebound, no guarding Ext:  2 plus pulses, no edema, no cyanosis, no clubbing Skin:  No rashes no nodules Neuro:  CN II through XII intact, motor grossly intact   DEVICE  Normal device function.  See PaceArt for details.   Assess/Plan: 1. Atrial fib - his ventricular rate is controlled. He will continue his current meds. 2. HTN - his blood pressure is well controlled. No change in meds. 3. PPM - his Medtronic single chamber PM is working normally. Will recheck in several months.  Jesus Beck.D.

## 2016-01-06 ENCOUNTER — Other Ambulatory Visit: Payer: Self-pay | Admitting: Cardiology

## 2016-01-07 ENCOUNTER — Encounter: Payer: Medicare Other | Admitting: *Deleted

## 2016-01-07 ENCOUNTER — Telehealth: Payer: Self-pay | Admitting: Cardiology

## 2016-01-07 NOTE — Telephone Encounter (Signed)
LMOVM reminding pt to send remote transmission.   

## 2016-01-10 ENCOUNTER — Encounter: Payer: Self-pay | Admitting: Cardiology

## 2016-01-13 ENCOUNTER — Encounter: Payer: Self-pay | Admitting: Cardiology

## 2016-01-13 ENCOUNTER — Ambulatory Visit (INDEPENDENT_AMBULATORY_CARE_PROVIDER_SITE_OTHER): Payer: Medicare Other | Admitting: *Deleted

## 2016-01-13 ENCOUNTER — Ambulatory Visit (INDEPENDENT_AMBULATORY_CARE_PROVIDER_SITE_OTHER): Payer: Medicare Other | Admitting: Cardiology

## 2016-01-13 VITALS — BP 132/80 | HR 71 | Ht 69.0 in | Wt 158.0 lb

## 2016-01-13 DIAGNOSIS — I208 Other forms of angina pectoris: Secondary | ICD-10-CM

## 2016-01-13 DIAGNOSIS — I482 Chronic atrial fibrillation, unspecified: Secondary | ICD-10-CM

## 2016-01-13 DIAGNOSIS — Z950 Presence of cardiac pacemaker: Secondary | ICD-10-CM | POA: Diagnosis not present

## 2016-01-13 DIAGNOSIS — E7800 Pure hypercholesterolemia, unspecified: Secondary | ICD-10-CM

## 2016-01-13 DIAGNOSIS — I442 Atrioventricular block, complete: Secondary | ICD-10-CM | POA: Diagnosis not present

## 2016-01-13 NOTE — Progress Notes (Signed)
[place]. [address] [place], [place]  [address] Phone: [phone] Fax:  [phone]  Date:  01/13/2016   ID:  Jesus Beck, DOB December 17, 1925, MRN [id number]  PCP:  Kandice Hams, MD   History of Present Illness: Jesus Beck is a 82 y.o. male with coronary artery disease status post CABG in 1999 with hypertension, hyperlipidemia, OSA-noncompliant with mask, paroxysmal atrial fibrillation previously on sotalol.  [employer]. Gave me a pen made out of pinecone- that his brother made.  He is currently asymptomatic in relation to his atrial fibrillation. He has been doing well, no syncope, no strokelike symptoms. We discussed at length risk of stroke, anticoagulation needs. Discussed with pharmacy.  Occasionally he would get ankle edema. He was placed on HCTZ for that. He did develop a rash along his chest wall and this medication was stopped several months ago. He saw dermatologist for the rash as well. Unsure what was the ultimate cause for the rash. I've asked him to stop his HCTZ in the past.  Eliquis low dose started early December 2015. Doing well. Feels well.  Pacemaker for HR 20's. Feels much stronger. Since pacer doing well.  Legs weak, balance. No falls. Right leg wose with edema than left. Now takes lasix.   Fallen twice in 2017. Has a lift chair at home. Staying quite active. Trimming hedges for instance.  Wt Readings from Last 3 Encounters:  01/13/16 158 lb (71.7 kg)  10/08/15 153 lb 12.8 oz (69.8 kg)  07/08/15 162 lb (73.5 kg)     Past Medical History:  Diagnosis Date  . Atrial fibrillation (HCC)    paroxysmal atrial fibrillation- stopped sotolol due to HR 44 (on 40mg  BID)  . Atrial fibrillation ([place])   . Back pain   . CAD (coronary artery disease)    s/p CABG 1999 with LIMA to MAD, SVG to first diagonal, SVG to RCA and PCI with DES mid LAD 06/09 and residual 70% D2 not amenable to PCI due to small caliber Cardiology Dr. Marlou Porch  . Carotid artery  stenosis     s/p bialteral CEA's 2004 with persistent bruit on left and minimal plaque by carotid dopplers 2010  . Dyslipidemia   . HTN (hypertension)   . Hypercholesteremia   . Hypersomnia   . RBBB   . Renal insufficiency     Past Surgical History:  Procedure Laterality Date  . CAROTID ENDARTERECTOMY Bilateral   . CHOLECYSTECTOMY    . CORONARY ARTERY BYPASS GRAFT    . EP IMPLANTABLE DEVICE N/A 08/27/2014   Procedure: PACEMAKER IMPLANT;  Surgeon: Belva Crome, MD;  Location[facility] CATH LAB;  Service: Cardiovascular;  Laterality: N/A;  . HERNIA REPAIR    . INTRAOCULAR LENS INSERTION    . PERMANENT PACEMAKER INSERTION N/A 08/27/2014   Procedure: Daneil Dan;  Surgeon: Belva Crome, MD;  Location: [facility] CATH LAB;  Service: Cardiovascular;  Laterality: N/A;  . PERMANENT PACEMAKER INSERTION N/A 08/27/2014   Procedure: PERMANENT PACEMAKER INSERTION;  Surgeon: Evans Lance, MD;  Location: [facility] CATH LAB;  Service: Cardiovascular;  Laterality: N/A;    Current Outpatient Prescriptions  Medication Sig Dispense Refill  . amLODipine (NORVASC) 5 MG tablet TAKE 1 TABLET BY MOUTH EVERY DAY 90 tablet 2  . apixaban (ELIQUIS) 2.5 MG TABS tablet Take 1 tablet (2.5 mg total) by mouth 2 (two) times daily. 30 tablet 0  . furosemide (LASIX) 20 MG tablet Take 20 mg  by mouth daily as needed for fluid or edema.    . isosorbide mononitrate (IMDUR) 60 MG 24 hr tablet TAKE 1 AND 1/2 TABLETS BY MOUTH EVERY DAY 135 tablet 2  . losartan (COZAAR) 100 MG tablet TAKE 1 TABLET(100 MG) BY MOUTH DAILY 90 tablet 3  . nitroGLYCERIN (NITROSTAT) 0.4 MG SL tablet Place 1 tablet (0.4 mg total) under the tongue every 5 (five) minutes as needed for chest pain. 25 tablet 3  . Omega-3 Fatty Acids (OMEGA 3 PO) Take 2,000 Units by mouth 2 (two) times daily.     . simvastatin (ZOCOR) 20 MG tablet TAKE 1 TABLET BY MOUTH EVERY NIGHT AT BEDTIME 90 tablet 0  . traMADol (ULTRAM) 50 MG tablet Take 50 mg by mouth daily as needed (pain).     No  current facility-administered medications for this visit.     Allergies:    Allergies  Allergen Reactions  . Lipitor [Atorvastatin] Hives    Social History:  The patient  reports that he has never smoked. He has never used smokeless tobacco. He reports that he does not drink alcohol or use drugs.   ROS:  Please see the history of present illness.   Denies any bleeding, syncope, orthopnea, PND. Low back pain. No syncope, no CP, no SOB.   PHYSICAL EXAM: VS:  BP 132/80   Pulse 71   Ht 5\' 9"  (1.753 m)   Wt 158 lb (71.7 kg)   BMI 23.33 kg/m  Well nourished, well developed, in no acute distress  HEENT: normal  Neck: no JVD  Cardiac:  Irregularly irregular normal rate, no murmur (pacer) Lungs:  clear to auscultation bilaterally, no wheezing, rhonchi or rales  Abd: soft, nontender, no hepatomegaly  Ext: 1-2+edema right, compression hose not on today, left trace Skin: warm and dry  Neuro: no focal abnormalities noted  EKG:   EKG ordered today 01/13/16-ventricular pacing 71 bpm with occasional intrinsic beat, atrial fibrillation underlying rhythm, T-wave inversion inferior lateral leads. No significant change from prior.. Previous 01/01/15 - V paced. Personally viewed. Prior- Atrial fibrillation heart rate 87 with right bundle branch block , left posterior fascicular block     labs reviewed from Dr. Lina Sar office.  ASSESSMENT AND PLAN:  1. Symptomatic bradycardia/atrial fibrillation with slow ventricular response heart rate in the 20s--pacemaker placed on 08/27/14 by Dr. Lovena Le. Single-chamber pacemaker, [employer]tronic placed. Doing very well. He feels much better with his pacemaker. 2. Chronic atrial fibrillation-was previously on sotalol. Atrial fibrillation noted on prior EKG. Currently well rate controlled. No longer on sotalol. Doing well. Heart rate stable. 3. Chronic anticoagulation- Eliquis 2.5 mg twice a day for about a month. No bleeding. Overall doing well. Dr. Delfina Redwood recently  checked blood work. Since he has been taking it, feels better.  On lower dose because of creatinine greater than 1.5 and age greater than 29. No bruising, no bleeding. 4. Coronary artery disease-prior CABG , isosorbide for antianginal, doing well 5.  angina- symptoms well-controlled post bypass as well as isosorbide 6. Carotid artery disease-prior endarterectomy 7. Obstructive sleep apnea -- unfortunately did not tolerate the mask well. 8. Hypertension-currently doing well. Continue with current meds.  I contemplated decreasing his losartan but at previous visit he had been minimally elevated. 9. Hyperlipidemia-continue with simvastatin 10. Mild ankle edema-compression hose, elevation. Right greater than left 11. We will see back in 6 months Nell Range, NP, one year with me.  Signed, Candee Furbish, MD [facility]  01/13/2016 11:32 AM

## 2016-01-13 NOTE — Patient Instructions (Signed)
Medication Instructions:  The current medical regimen is effective;  continue present plan and medications.  Follow-Up: Follow up in 6 months with Katy Thompson.  You will receive a letter in the mail 2 months before you are due.  Please call us when you receive this letter to schedule your follow up appointment.  Follow up in 1 year with Dr. Skains.  You will receive a letter in the mail 2 months before you are due.  Please call us when you receive this letter to schedule your follow up appointment.   If you need a refill on your cardiac medications before your next appointment, please call your pharmacy.  Thank you for choosing [facility]!!     

## 2016-01-15 LAB — CUP PACEART REMOTE DEVICE CHECK
Battery Remaining Longevity: 104 mo
Battery Voltage: 2.77 V
Implantable Lead Implant Date: 20160425
Implantable Lead Location: 753860
Lead Channel Impedance Value: 0 Ohm
Lead Channel Pacing Threshold Amplitude: 0.5 V
Lead Channel Pacing Threshold Pulse Width: 0.4 ms
Lead Channel Setting Pacing Amplitude: 2.25 V
MDC IDC MSMT BATTERY IMPEDANCE: 232 Ohm
MDC IDC MSMT LEADCHNL RV IMPEDANCE VALUE: 605 Ohm
MDC IDC SESS DTM: 20170911183309
MDC IDC SET LEADCHNL RV PACING PULSEWIDTH: 0.46 ms
MDC IDC SET LEADCHNL RV SENSING SENSITIVITY: 2 mV
MDC IDC STAT BRADY RV PERCENT PACED: 82 %

## 2016-01-15 NOTE — Progress Notes (Signed)
Remote pacemaker transmission.   

## 2016-01-16 ENCOUNTER — Encounter: Payer: Self-pay | Admitting: Cardiology

## 2016-01-24 DIAGNOSIS — Z23 Encounter for immunization: Secondary | ICD-10-CM | POA: Diagnosis not present

## 2016-02-14 DIAGNOSIS — M25521 Pain in right elbow: Secondary | ICD-10-CM | POA: Diagnosis not present

## 2016-02-18 DIAGNOSIS — H43813 Vitreous degeneration, bilateral: Secondary | ICD-10-CM | POA: Diagnosis not present

## 2016-02-18 DIAGNOSIS — Z961 Presence of intraocular lens: Secondary | ICD-10-CM | POA: Diagnosis not present

## 2016-02-18 DIAGNOSIS — H3530 Unspecified macular degeneration: Secondary | ICD-10-CM | POA: Diagnosis not present

## 2016-02-18 DIAGNOSIS — Z9849 Cataract extraction status, unspecified eye: Secondary | ICD-10-CM | POA: Diagnosis not present

## 2016-03-30 DIAGNOSIS — M545 Low back pain: Secondary | ICD-10-CM | POA: Diagnosis not present

## 2016-03-30 DIAGNOSIS — N183 Chronic kidney disease, stage 3 (moderate): Secondary | ICD-10-CM | POA: Diagnosis not present

## 2016-03-30 DIAGNOSIS — I10 Essential (primary) hypertension: Secondary | ICD-10-CM | POA: Diagnosis not present

## 2016-03-30 DIAGNOSIS — E7800 Pure hypercholesterolemia, unspecified: Secondary | ICD-10-CM | POA: Diagnosis not present

## 2016-03-30 DIAGNOSIS — G8929 Other chronic pain: Secondary | ICD-10-CM | POA: Diagnosis not present

## 2016-04-15 ENCOUNTER — Ambulatory Visit (INDEPENDENT_AMBULATORY_CARE_PROVIDER_SITE_OTHER): Payer: Medicare Other | Admitting: *Deleted

## 2016-04-15 ENCOUNTER — Telehealth: Payer: Self-pay | Admitting: Cardiology

## 2016-04-15 DIAGNOSIS — I442 Atrioventricular block, complete: Secondary | ICD-10-CM

## 2016-04-15 NOTE — Telephone Encounter (Signed)
LMOVM reminding pt to send remote transmission.   

## 2016-04-15 NOTE — Progress Notes (Signed)
Remote pacemaker transmission.   

## 2016-04-22 ENCOUNTER — Encounter: Payer: Self-pay | Admitting: Cardiology

## 2016-05-01 LAB — CUP PACEART REMOTE DEVICE CHECK
Battery Remaining Longevity: 99 mo
Battery Voltage: 2.77 V
Date Time Interrogation Session: 20171213171336
Implantable Lead Implant Date: 20160425
Implantable Lead Location: 753860
Implantable Lead Model: 5076
Implantable Pulse Generator Implant Date: 20160425
Lead Channel Pacing Threshold Pulse Width: 0.4 ms
MDC IDC MSMT BATTERY IMPEDANCE: 280 Ohm
MDC IDC MSMT LEADCHNL RA IMPEDANCE VALUE: 0 Ohm
MDC IDC MSMT LEADCHNL RV IMPEDANCE VALUE: 582 Ohm
MDC IDC MSMT LEADCHNL RV PACING THRESHOLD AMPLITUDE: 0.625 V
MDC IDC SET LEADCHNL RV PACING AMPLITUDE: 2.25 V
MDC IDC SET LEADCHNL RV PACING PULSEWIDTH: 0.4 ms
MDC IDC SET LEADCHNL RV SENSING SENSITIVITY: 2 mV
MDC IDC STAT BRADY RV PERCENT PACED: 88 %

## 2016-05-11 ENCOUNTER — Other Ambulatory Visit: Payer: Self-pay

## 2016-05-11 MED ORDER — APIXABAN 2.5 MG PO TABS: 2.5000 mg | ORAL_TABLET | Freq: Two times a day (BID) | ORAL | 3 refills | Status: DC

## 2016-05-25 ENCOUNTER — Other Ambulatory Visit: Payer: Self-pay | Admitting: *Deleted

## 2016-05-25 MED ORDER — APIXABAN 2.5 MG PO TABS: 2.5000 mg | ORAL_TABLET | Freq: Two times a day (BID) | ORAL | 2 refills | Status: DC

## 2016-05-26 ENCOUNTER — Other Ambulatory Visit: Payer: Self-pay | Admitting: *Deleted

## 2016-05-26 MED ORDER — APIXABAN 2.5 MG PO TABS: 2.5000 mg | ORAL_TABLET | Freq: Two times a day (BID) | ORAL | 1 refills | Status: DC

## 2016-05-26 NOTE — Telephone Encounter (Signed)
Age 83 Weight 71.7kg SrCr 1.62 done 03/30/2016 Hgb 13.6 done 10/01/2015  HCT 40.3 done 10/01/2015 Saw Dr Marlou Porch 01/13/2016

## 2016-06-26 ENCOUNTER — Other Ambulatory Visit: Payer: Self-pay | Admitting: Cardiology

## 2016-06-26 DIAGNOSIS — M25561 Pain in right knee: Secondary | ICD-10-CM | POA: Diagnosis not present

## 2016-07-13 NOTE — Progress Notes (Deleted)
Cardiology Office Note    Date:  07/13/2016   ID:  Jesus Beck, DOB 1925-09-20, MRN [id number]  PCP:  Kandice Hams, MD  Cardiologist:  Dr. Marlou Porch / Dr. Lovena Le (EP)  CC: follow up  History of Present Illness:  Jesus Beck is a 83 y.o. male with a history of CAD s/p CABG (1999), HTN, HLD, OSA (non complaint with CPAP), chronic AF on Eliquis, CHB s/p Medtronic PPM, carotid artery disease s/p CEA, and HLD who presents to clinic for 6 month follow up.   He was last seen by Dr. Marlou Porch in 01/2016 and doing well.  Today he presents to clinic for follow up.   Past Medical History:  Diagnosis Date  . Atrial fibrillation (HCC)    paroxysmal atrial fibrillation- stopped sotolol due to HR 44 (on 40mg  BID)  . Atrial fibrillation ([place])   . Back pain   . CAD (coronary artery disease)    s/p CABG 1999 with LIMA to MAD, SVG to first diagonal, SVG to RCA and PCI with DES mid LAD 06/09 and residual 70% D2 not amenable to PCI due to small caliber Cardiology Dr. Marlou Porch  . Carotid artery stenosis     s/p bialteral CEA's 2004 with persistent bruit on left and minimal plaque by carotid dopplers 2010  . Dyslipidemia   . HTN (hypertension)   . Hypercholesteremia   . Hypersomnia   . RBBB   . Renal insufficiency     Past Surgical History:  Procedure Laterality Date  . CAROTID ENDARTERECTOMY Bilateral   . CHOLECYSTECTOMY    . CORONARY ARTERY BYPASS GRAFT    . EP IMPLANTABLE DEVICE N/A 08/27/2014   Procedure: PACEMAKER IMPLANT;  Surgeon: Belva Crome, MD;  Location: [facility] CATH LAB;  Service: Cardiovascular;  Laterality: N/A;  . HERNIA REPAIR    . INTRAOCULAR LENS INSERTION    . PERMANENT PACEMAKER INSERTION N/A 08/27/2014   Procedure: Daneil Dan;  Surgeon: Belva Crome, MD;  Location: [facility] CATH LAB;  Service: Cardiovascular;  Laterality: N/A;  . PERMANENT PACEMAKER INSERTION N/A 08/27/2014   Procedure: PERMANENT PACEMAKER INSERTION;  Surgeon: Evans Lance, MD;  Location: [facility] CATH LAB;  Service:  Cardiovascular;  Laterality: N/A;    Current Medications: Outpatient Medications Prior to Visit  Medication Sig Dispense Refill  . amLODipine (NORVASC) 5 MG tablet TAKE 1 TABLET BY MOUTH EVERY DAY 90 tablet 2  . apixaban (ELIQUIS) 2.5 MG TABS tablet Take 1 tablet (2.5 mg total) by mouth 2 (two) times daily. 180 tablet 1  . furosemide (LASIX) 20 MG tablet Take 20 mg by mouth daily as needed for fluid or edema.    . isosorbide mononitrate (IMDUR) 60 MG 24 hr tablet TAKE 1 AND 1/2 TABLETS BY MOUTH EVERY DAY 135 tablet 1  . losartan (COZAAR) 100 MG tablet TAKE 1 TABLET(100 MG) BY MOUTH DAILY 90 tablet 3  . nitroGLYCERIN (NITROSTAT) 0.4 MG SL tablet Place 1 tablet (0.4 mg total) under the tongue every 5 (five) minutes as needed for chest pain. 25 tablet 3  . Omega-3 Fatty Acids (OMEGA 3 PO) Take 2,000 Units by mouth 2 (two) times daily.     . simvastatin (ZOCOR) 20 MG tablet TAKE 1 TABLET BY MOUTH EVERY NIGHT AT BEDTIME 90 tablet 0  . traMADol (ULTRAM) 50 MG tablet Take 50 mg by mouth daily as needed (pain).     No facility-administered medications prior to visit.      Allergies:   Lipitor [  atorvastatin]   Social History   Social History  . Marital status: Widowed    Spouse name: N/A  . Number of children: N/A  . Years of education: N/A   Social History Main Topics  . Smoking status: Never Smoker  . Smokeless tobacco: Never Used  . Alcohol use No  . Drug use: No  . Sexual activity: Not on file   Other Topics Concern  . Not on file   Social History Narrative  . No narrative on file     Family History:  The patient's ***family history includes Diabetes in his mother; Heart disease in his father.      *** ROS/PE    Wt Readings from Last 3 Encounters:  01/13/16 158 lb (71.7 kg)  10/08/15 153 lb 12.8 oz (69.8 kg)  07/08/15 162 lb (73.5 kg)      Studies/Labs Reviewed:   EKG:  EKG is*** ordered today.  The ekg ordered today demonstrates ***  Recent Labs: No results  found for requested labs within last 8760 hours.   Lipid Panel    Component Value Date/Time   CHOL 134 03/27/2013 1159   TRIG 83.0 03/27/2013 1159   HDL 51.50 03/27/2013 1159   CHOLHDL 3 03/27/2013 1159   VLDL 16.6 03/27/2013 1159   LDLCALC 66 03/27/2013 1159    Additional studies/ records that were reviewed today include:   ECHO 2009 SUMMARY - Overall left ventricular systolic function was vigorous. Left    ventricular ejection fraction was estimated , range being 65    % to 70 %. There was no diagnostic evidence of left    ventricular regional wall motion abnormalities. - The aortic valve was mildly calcified. - There was mild mitral annular calcification. - The left atrium was mildly dilated.  ASSESSMENT & PLAN:   CAD s/p CABG:  Chronic atrial fib:  Carotid artery disease:  CHB s/p PPM:  OSA:  HTN:  HLD:   Medication Adjustments/Labs and Tests Ordered: Current medicines are reviewed at length with the patient today.  Concerns regarding medicines are outlined above.  Medication changes, Labs and Tests ordered today are listed in the Patient Instructions below. There are no Patient Instructions on file for this visit.   Signed, Angelena Form, PA-C  07/13/2016 2:49 PM    [facility] [place], [place], [place]  [address] Phone: [phone]; Fax: [phone]

## 2016-07-14 ENCOUNTER — Ambulatory Visit: Payer: Medicare Other | Admitting: Physician Assistant

## 2016-07-15 ENCOUNTER — Ambulatory Visit (INDEPENDENT_AMBULATORY_CARE_PROVIDER_SITE_OTHER): Payer: Medicare Other | Admitting: *Deleted

## 2016-07-15 ENCOUNTER — Telehealth: Payer: Self-pay | Admitting: Cardiology

## 2016-07-15 DIAGNOSIS — I442 Atrioventricular block, complete: Secondary | ICD-10-CM | POA: Diagnosis not present

## 2016-07-15 NOTE — Telephone Encounter (Signed)
Spoke with pt and reminded pt of remote transmission that is due today. Pt verbalized understanding.   

## 2016-07-15 NOTE — Progress Notes (Signed)
Remote pacemaker transmission.   

## 2016-07-16 ENCOUNTER — Encounter: Payer: Self-pay | Admitting: Cardiology

## 2016-07-17 LAB — CUP PACEART REMOTE DEVICE CHECK
Battery Impedance: 305 Ohm
Battery Remaining Longevity: 97 mo
Implantable Lead Implant Date: 20160425
Lead Channel Impedance Value: 616 Ohm
Lead Channel Pacing Threshold Amplitude: 0.625 V
Lead Channel Pacing Threshold Pulse Width: 0.4 ms
Lead Channel Setting Pacing Pulse Width: 0.4 ms
Lead Channel Setting Sensing Sensitivity: 4 mV
MDC IDC LEAD LOCATION: 753860
MDC IDC MSMT BATTERY VOLTAGE: 2.77 V
MDC IDC MSMT LEADCHNL RA IMPEDANCE VALUE: 0 Ohm
MDC IDC PG IMPLANT DT: 20160425
MDC IDC SESS DTM: 20180314134637
MDC IDC SET LEADCHNL RV PACING AMPLITUDE: 2.25 V
MDC IDC STAT BRADY RV PERCENT PACED: 92 %

## 2016-07-22 ENCOUNTER — Encounter: Payer: Self-pay | Admitting: Physician Assistant

## 2016-08-21 ENCOUNTER — Other Ambulatory Visit: Payer: Self-pay | Admitting: Cardiology

## 2016-08-24 NOTE — Progress Notes (Deleted)
Cardiology Office Note    Date:  08/24/2016   ID:  Jesus Beck, DOB [birth year], MRN [id number]  PCP:  Kandice Hams, MD  Cardiologist:  Dr. Marlou Porch / Dr. Lovena Le (EP)   CC: follow up   History of Present Illness:  Jesus Beck is a 83 y.o. male with a history of CAD s/p CABG (1999), HTN, HLD, OSA (non complaint with CPAP), RBBB,  chronic AF on Eliquis, CHB s/p Medtronic PPM, carotid artery disease s/p CEA, and HLD who presents to clinic for 6 month follow up.   He was last seen by Dr. Marlou Porch in 01/2016 and doing well.   Today he presents to clinic for follow up.     Past Medical History:  Diagnosis Date  . Atrial fibrillation (HCC)    paroxysmal atrial fibrillation- stopped sotolol due to HR 44 (on 40mg  BID)  . Atrial fibrillation ([place])   . Back pain   . CAD (coronary artery disease)    s/p CABG 1999 with LIMA to MAD, SVG to first diagonal, SVG to RCA and PCI with DES mid LAD 06/09 and residual 70% D2 not amenable to PCI due to small caliber Cardiology Dr. Marlou Porch  . Carotid artery stenosis     s/p bialteral CEA's 2004 with persistent bruit on left and minimal plaque by carotid dopplers 2010  . Dyslipidemia   . HTN (hypertension)   . Hypercholesteremia   . Hypersomnia   . RBBB   . Renal insufficiency     Past Surgical History:  Procedure Laterality Date  . CAROTID ENDARTERECTOMY Bilateral   . CHOLECYSTECTOMY    . CORONARY ARTERY BYPASS GRAFT    . EP IMPLANTABLE DEVICE N/A 08/27/2014   Procedure: PACEMAKER IMPLANT;  Surgeon: Belva Crome, MD;  Location: [facility] CATH LAB;  Service: Cardiovascular;  Laterality: N/A;  . HERNIA REPAIR    . INTRAOCULAR LENS INSERTION    . PERMANENT PACEMAKER INSERTION N/A 08/27/2014   Procedure: Daneil Dan;  Surgeon: Belva Crome, MD;  Location: [facility] CATH LAB;  Service: Cardiovascular;  Laterality: N/A;  . PERMANENT PACEMAKER INSERTION N/A 08/27/2014   Procedure: PERMANENT PACEMAKER INSERTION;  Surgeon: Evans Lance, MD;  Location: [facility] CATH  LAB;  Service: Cardiovascular;  Laterality: N/A;    Current Medications: Outpatient Medications Prior to Visit  Medication Sig Dispense Refill  . amLODipine (NORVASC) 5 MG tablet TAKE 1 TABLET BY MOUTH EVERY DAY 90 tablet 1  . apixaban (ELIQUIS) 2.5 MG TABS tablet Take 1 tablet (2.5 mg total) by mouth 2 (two) times daily. 180 tablet 1  . furosemide (LASIX) 20 MG tablet Take 20 mg by mouth daily as needed for fluid or edema.    . isosorbide mononitrate (IMDUR) 60 MG 24 hr tablet TAKE 1 AND 1/2 TABLETS BY MOUTH EVERY DAY 135 tablet 1  . losartan (COZAAR) 100 MG tablet TAKE 1 TABLET(100 MG) BY MOUTH DAILY 90 tablet 3  . nitroGLYCERIN (NITROSTAT) 0.4 MG SL tablet Place 1 tablet (0.4 mg total) under the tongue every 5 (five) minutes as needed for chest pain. 25 tablet 3  . Omega-3 Fatty Acids (OMEGA 3 PO) Take 2,000 Units by mouth 2 (two) times daily.     . simvastatin (ZOCOR) 20 MG tablet TAKE 1 TABLET BY MOUTH EVERY NIGHT AT BEDTIME 90 tablet 0  . traMADol (ULTRAM) 50 MG tablet Take 50 mg by mouth daily as needed (pain).     No facility-administered medications prior to visit.  Allergies:   Lipitor [atorvastatin]   Social History   Social History  . Marital status: Widowed    Spouse name: N/A  . Number of children: N/A  . Years of education: N/A   Social History Main Topics  . Smoking status: Never Smoker  . Smokeless tobacco: Never Used  . Alcohol use No  . Drug use: No  . Sexual activity: Not on file   Other Topics Concern  . Not on file   Social History Narrative  . No narrative on file     Family History:  The patient's ***family history includes Diabetes in his mother; Heart disease in his father.      *** ROS/PE    Wt Readings from Last 3 Encounters:  01/13/16 158 lb (71.7 kg)  10/08/15 153 lb 12.8 oz (69.8 kg)  07/08/15 162 lb (73.5 kg)      Studies/Labs Reviewed:   EKG:  EKG is*** ordered today.  The ekg ordered today demonstrates ***  Recent  Labs: No results found for requested labs within last 8760 hours.   Lipid Panel    Component Value Date/Time   CHOL 134 03/27/2013 1159   TRIG 83.0 03/27/2013 1159   HDL 51.50 03/27/2013 1159   CHOLHDL 3 03/27/2013 1159   VLDL 16.6 03/27/2013 1159   LDLCALC 66 03/27/2013 1159    Additional studies/ records that were reviewed today include: ECHO 2009  SUMMARY  - Overall left ventricular systolic function was vigorous. Left     ventricular ejection fraction was estimated , range being 65     % to 70 %. There was no diagnostic evidence of left     ventricular regional wall motion abnormalities.  - The aortic valve was mildly calcified.  - There was mild mitral annular calcification.  - The left atrium was mildly dilated.    ASSESSMENT & PLAN:   CAD s/p CABG:  Chronic atrial fib:   Carotid artery disease:   CHB s/p PPM:   OSA:   HTN:   HLD:    Medication Adjustments/Labs and Tests Ordered: Current medicines are reviewed at length with the patient today.  Concerns regarding medicines are outlined above.  Medication changes, Labs and Tests ordered today are listed in the Patient Instructions below. There are no Patient Instructions on file for this visit.   Signed, Angelena Form, PA-C  08/24/2016 3:13 PM    [facility] [address], [place], [place]  [address] Phone: [phone]; Fax: [phone]

## 2016-08-25 ENCOUNTER — Ambulatory Visit: Payer: Medicare Other | Admitting: Physician Assistant

## 2016-08-26 ENCOUNTER — Encounter: Payer: Self-pay | Admitting: Physician Assistant

## 2016-09-11 DIAGNOSIS — M109 Gout, unspecified: Secondary | ICD-10-CM | POA: Diagnosis not present

## 2016-09-11 NOTE — Progress Notes (Signed)
Cardiology Office Note    Date:  09/14/2016   ID:  Jesus Beck, DOB 1926-02-06, MRN [id number]  PCP:  Seward Carol, MD  Cardiologist:  Dr. Marlou Porch / Dr. Lovena Le (EP)   CC: follow up   History of Present Illness:  Jesus Beck is a 83 y.o. male with a history of CAD s/p CABG (1999), HTN, HLD, OSA (non complaint with CPAP), RBBB, CKD, chronic AF on Eliquis, CHB s/p Medtronic PPM (2016), carotid artery disease s/p bilateral CEA (2001), and HLD who presents to clinic for 6 month follow up.   He was last seen by Dr. Marlou Porch in 01/2016 and doing well.   Today he presents to clinic for follow up. He has been doing well except had a bad gout flare in right knee and foot last week. Now on colchicine and it is improving. He has balance issues that limits his activity. He still likes to work in the yard but this has become more difficult given his need for cane. No chest pain or SOB. He paces himself by working and hour and resting an hour. He has chronic LE edema that is unchanged. No orthopnea or PND. No dizziness or syncope. No blood in stool or urine. No palpitations.      Past Medical History:  Diagnosis Date  . Atrial fibrillation (HCC)    paroxysmal atrial fibrillation- stopped sotolol due to HR 44 (on 40mg  BID)  . Atrial fibrillation ([place])   . Back pain   . CAD (coronary artery disease)    s/p CABG 1999 with LIMA to MAD, SVG to first diagonal, SVG to RCA and PCI with DES mid LAD 06/09 and residual 70% D2 not amenable to PCI due to small caliber Cardiology Dr. Marlou Porch  . Carotid artery stenosis     s/p bialteral CEA's 2004 with persistent bruit on left and minimal plaque by carotid dopplers 2010  . Dyslipidemia   . HTN (hypertension)   . Hypercholesteremia   . Hypersomnia   . RBBB   . Renal insufficiency     Past Surgical History:  Procedure Laterality Date  . CAROTID ENDARTERECTOMY Bilateral   . CHOLECYSTECTOMY    . CORONARY ARTERY BYPASS GRAFT    . EP IMPLANTABLE DEVICE  N/A 08/27/2014   Procedure: PACEMAKER IMPLANT;  Surgeon: Belva Crome, MD;  Location: [facility] CATH LAB;  Service: Cardiovascular;  Laterality: N/A;  . HERNIA REPAIR    . INTRAOCULAR LENS INSERTION    . PERMANENT PACEMAKER INSERTION N/A 08/27/2014   Procedure: Daneil Dan;  Surgeon: Belva Crome, MD;  Location: [facility] CATH LAB;  Service: Cardiovascular;  Laterality: N/A;  . PERMANENT PACEMAKER INSERTION N/A 08/27/2014   Procedure: PERMANENT PACEMAKER INSERTION;  Surgeon: Evans Lance, MD;  Location: [facility] CATH LAB;  Service: Cardiovascular;  Laterality: N/A;    Current Medications: Outpatient Medications Prior to Visit  Medication Sig Dispense Refill  . amLODipine (NORVASC) 5 MG tablet TAKE 1 TABLET BY MOUTH EVERY DAY 90 tablet 1  . apixaban (ELIQUIS) 2.5 MG TABS tablet Take 1 tablet (2.5 mg total) by mouth 2 (two) times daily. 180 tablet 1  . furosemide (LASIX) 20 MG tablet Take 20 mg by mouth daily as needed for fluid or edema.    . isosorbide mononitrate (IMDUR) 60 MG 24 hr tablet TAKE 1 AND 1/2 TABLETS BY MOUTH EVERY DAY 135 tablet 1  . losartan (COZAAR) 100 MG tablet TAKE 1 TABLET(100 MG) BY MOUTH DAILY 90 tablet 3  .  nitroGLYCERIN (NITROSTAT) 0.4 MG SL tablet Place 1 tablet (0.4 mg total) under the tongue every 5 (five) minutes as needed for chest pain. 25 tablet 3  . Omega-3 Fatty Acids (OMEGA 3 PO) Take 2,000 Units by mouth 2 (two) times daily.     . simvastatin (ZOCOR) 20 MG tablet TAKE 1 TABLET BY MOUTH EVERY NIGHT AT BEDTIME 90 tablet 0  . traMADol (ULTRAM) 50 MG tablet Take 50 mg by mouth daily as needed (pain).     No facility-administered medications prior to visit.      Allergies:   Lipitor [atorvastatin]   Social History   Social History  . Marital status: Widowed    Spouse name: N/A  . Number of children: N/A  . Years of education: N/A   Social History Main Topics  . Smoking status: Never Smoker  . Smokeless tobacco: Never Used  . Alcohol use No  . Drug use: No  . Sexual  activity: Not Asked   Other Topics Concern  . None   Social History Narrative  . None     Family History:  The patient's family history includes Diabetes in his mother; Heart disease in his father.      ROS:   Please see the history of present illness.    ROS All other systems reviewed and are negative.   PHYSICAL EXAM:   VS:  BP (!) 118/56   Pulse 64   Ht 5' 7.5" (1.715 m)   Wt 165 lb 12.8 oz (75.2 kg)   BMI 25.58 kg/m    GEN: Well nourished, well developed, in no acute distress  HEENT: normal  Neck: no JVD, carotid bruits, or masses Cardiac: RRR; no murmurs, rubs, or gallops,no edema  Respiratory:  clear to auscultation bilaterally, normal work of breathing GI: soft, nontender, nondistended, + BS MS: no deformity or atrophy  Skin: warm and dry, no rash Neuro:  Alert and Oriented x 3, Strength and sensation are intact Psych: euthymic mood, full affect    Wt Readings from Last 3 Encounters:  09/14/16 165 lb 12.8 oz (75.2 kg)  01/13/16 158 lb (71.7 kg)  10/08/15 153 lb 12.8 oz (69.8 kg)      Studies/Labs Reviewed:   EKG:  EKG is ordered today.  The ekg ordered today demonstrates Paced HR 65, LBBB  Recent Labs: No results found for requested labs within last 8760 hours.   Lipid Panel    Component Value Date/Time   CHOL 134 03/27/2013 1159   TRIG 83.0 03/27/2013 1159   HDL 51.50 03/27/2013 1159   CHOLHDL 3 03/27/2013 1159   VLDL 16.6 03/27/2013 1159   LDLCALC 66 03/27/2013 1159    Additional studies/ records that were reviewed today include:  ECHO 2009  SUMMARY  - Overall left ventricular systolic function was vigorous. Left     ventricular ejection fraction was estimated , range being 65     % to 70 %. There was no diagnostic evidence of left     ventricular regional wall motion abnormalities.  - The aortic valve was mildly calcified.  - There was mild mitral annular calcification.  - The left atrium was mildly dilated.     ASSESSMENT & PLAN:   CAD s/p CABG: stable. No ASA given Eliquis use. Continue statin. Not on BB for unclear reasons. He has been stable for a long time, so will not make any changes.   Chronic atrial fib: he is currently paced. Continue Eliquis 2.5mg  BID (>80  and creat >1.5). CHADSVASC of at least 4 (HTN, age, vasc dz). Will check BMET and CBC. I will forward these to Dr. Delfina Redwood.  Carotid artery disease: s/p previous bilateral CEAs remotely. Continue ASA and statin   CHB s/p PPM: followed by Dr. Lovena Le. He has a follow up next month.   OSA: cannot tolerate his CPAP  HTN: BP well controlled on current regimen  HLD: continue statin    Medication Adjustments/Labs and Tests Ordered: Current medicines are reviewed at length with the patient today.  Concerns regarding medicines are outlined above.  Medication changes, Labs and Tests ordered today are listed in the Patient Instructions below. Patient Instructions  Medication Instructions:  Your physician recommends that you continue on your current medications as directed. Please refer to the Current Medication list given to you today.   Labwork: TODAY: CBC & BMET  Testing/Procedures: None ordered  Follow-Up: Your physician wants you to follow-up in: [place] will receive a reminder letter in the mail two months in advance. If you don't receive a letter, please call our office to schedule the follow-up appointment.  Any Other Special Instructions Will Be Listed Below (If Applicable).     If you need a refill on your cardiac medications before your next appointment, please call your pharmacy.      Signed, Angelena Form, PA-C  09/14/2016 1:55 PM    [facility] [place], [place], [place]  [address] Phone: [phone]; Fax: [phone]

## 2016-09-14 ENCOUNTER — Encounter: Payer: Self-pay | Admitting: Physician Assistant

## 2016-09-14 ENCOUNTER — Ambulatory Visit (INDEPENDENT_AMBULATORY_CARE_PROVIDER_SITE_OTHER): Payer: Medicare Other | Admitting: Physician Assistant

## 2016-09-14 ENCOUNTER — Encounter (INDEPENDENT_AMBULATORY_CARE_PROVIDER_SITE_OTHER): Payer: Self-pay

## 2016-09-14 VITALS — BP 118/56 | HR 64 | Ht 67.5 in | Wt 165.8 lb

## 2016-09-14 DIAGNOSIS — E7800 Pure hypercholesterolemia, unspecified: Secondary | ICD-10-CM | POA: Diagnosis not present

## 2016-09-14 DIAGNOSIS — Z950 Presence of cardiac pacemaker: Secondary | ICD-10-CM

## 2016-09-14 DIAGNOSIS — I779 Disorder of arteries and arterioles, unspecified: Secondary | ICD-10-CM | POA: Diagnosis not present

## 2016-09-14 DIAGNOSIS — I2510 Atherosclerotic heart disease of native coronary artery without angina pectoris: Secondary | ICD-10-CM | POA: Diagnosis not present

## 2016-09-14 DIAGNOSIS — I482 Chronic atrial fibrillation, unspecified: Secondary | ICD-10-CM

## 2016-09-14 DIAGNOSIS — I739 Peripheral vascular disease, unspecified: Secondary | ICD-10-CM

## 2016-09-14 DIAGNOSIS — I10 Essential (primary) hypertension: Secondary | ICD-10-CM

## 2016-09-14 DIAGNOSIS — I2583 Coronary atherosclerosis due to lipid rich plaque: Secondary | ICD-10-CM | POA: Diagnosis not present

## 2016-09-14 DIAGNOSIS — G4733 Obstructive sleep apnea (adult) (pediatric): Secondary | ICD-10-CM | POA: Diagnosis not present

## 2016-09-14 NOTE — Patient Instructions (Addendum)
Medication Instructions:  Your physician recommends that you continue on your current medications as directed. Please refer to the Current Medication list given to you today.   Labwork: TODAY: CBC & BMET  Testing/Procedures: None ordered  Follow-Up: Your physician wants you to follow-up in: [place] will receive a reminder letter in the mail two months in advance. If you don't receive a letter, please call our office to schedule the follow-up appointment.  Any Other Special Instructions Will Be Listed Below (If Applicable).     If you need a refill on your cardiac medications before your next appointment, please call your pharmacy.

## 2016-09-15 ENCOUNTER — Telehealth: Payer: Self-pay | Admitting: Internal Medicine

## 2016-09-15 LAB — CBC
HEMOGLOBIN: 11.7 g/dL — AB (ref 13.0–17.7)
Hematocrit: 33.9 % — ABNORMAL LOW (ref 37.5–51.0)
MCH: 30.7 pg (ref 26.6–33.0)
MCHC: 34.5 g/dL (ref 31.5–35.7)
MCV: 89 fL (ref 79–97)
Platelets: 180 10*3/uL (ref 150–379)
RBC: 3.81 x10E6/uL — AB (ref 4.14–5.80)
RDW: 14.1 % (ref 12.3–15.4)
WBC: 12.1 10*3/uL — ABNORMAL HIGH (ref 3.4–10.8)

## 2016-09-15 LAB — BASIC METABOLIC PANEL
BUN/Creatinine Ratio: 27 — ABNORMAL HIGH (ref 10–24)
BUN: 38 mg/dL — AB (ref 8–27)
CALCIUM: 9.7 mg/dL (ref 8.6–10.2)
CO2: 23 mmol/L (ref 18–29)
CREATININE: 1.43 mg/dL — AB (ref 0.76–1.27)
Chloride: 97 mmol/L (ref 96–106)
GFR calc Af Amer: 50 mL/min/{1.73_m2} — ABNORMAL LOW (ref >59)
GFR calc non Af Amer: 43 mL/min/{1.73_m2} — ABNORMAL LOW (ref >59)
GLUCOSE: 115 mg/dL — AB (ref 65–99)
Potassium: 4.2 mmol/L (ref 3.5–5.2)
Sodium: 139 mmol/L (ref 134–144)

## 2016-09-15 NOTE — Telephone Encounter (Signed)
Patient is returning your call,thanks. °

## 2016-09-23 ENCOUNTER — Telehealth: Payer: Self-pay | Admitting: Physician Assistant

## 2016-09-23 NOTE — Telephone Encounter (Signed)
Per pt call  He would like a call back about ....call dropped

## 2016-09-25 ENCOUNTER — Encounter: Payer: Self-pay | Admitting: *Deleted

## 2016-10-27 DIAGNOSIS — M79675 Pain in left toe(s): Secondary | ICD-10-CM | POA: Diagnosis not present

## 2016-10-27 DIAGNOSIS — I10 Essential (primary) hypertension: Secondary | ICD-10-CM | POA: Diagnosis not present

## 2016-10-27 DIAGNOSIS — M109 Gout, unspecified: Secondary | ICD-10-CM | POA: Diagnosis not present

## 2016-10-27 DIAGNOSIS — L03116 Cellulitis of left lower limb: Secondary | ICD-10-CM | POA: Diagnosis not present

## 2016-10-27 DIAGNOSIS — B351 Tinea unguium: Secondary | ICD-10-CM | POA: Diagnosis not present

## 2016-10-29 ENCOUNTER — Ambulatory Visit (INDEPENDENT_AMBULATORY_CARE_PROVIDER_SITE_OTHER): Payer: Medicare Other | Admitting: Internal Medicine

## 2016-10-29 ENCOUNTER — Encounter: Payer: Self-pay | Admitting: Internal Medicine

## 2016-10-29 ENCOUNTER — Encounter (INDEPENDENT_AMBULATORY_CARE_PROVIDER_SITE_OTHER): Payer: Self-pay

## 2016-10-29 VITALS — BP 120/68 | HR 57 | Ht 69.0 in | Wt 158.4 lb

## 2016-10-29 DIAGNOSIS — Z950 Presence of cardiac pacemaker: Secondary | ICD-10-CM | POA: Diagnosis not present

## 2016-10-29 DIAGNOSIS — I2583 Coronary atherosclerosis due to lipid rich plaque: Secondary | ICD-10-CM

## 2016-10-29 DIAGNOSIS — I2510 Atherosclerotic heart disease of native coronary artery without angina pectoris: Secondary | ICD-10-CM

## 2016-10-29 DIAGNOSIS — I442 Atrioventricular block, complete: Secondary | ICD-10-CM | POA: Diagnosis not present

## 2016-10-29 NOTE — Progress Notes (Signed)
HPI [name] returns today for followup. He is a pleasant 83 yo man with a h/o atrial fibrillation and complete heart block, s/p PPM insertion, also with CAD, s/p CABG. In the interim he has been bothered by some arthritis. His gout has acted up and affected his foot and knee. No chest pain or sob.  Allergies  Allergen Reactions  . Lipitor [Atorvastatin] Hives     Current Outpatient Prescriptions  Medication Sig Dispense Refill  . amLODipine (NORVASC) 5 MG tablet TAKE 1 TABLET BY MOUTH EVERY DAY 90 tablet 1  . apixaban (ELIQUIS) 2.5 MG TABS tablet Take 1 tablet (2.5 mg total) by mouth 2 (two) times daily. 180 tablet 1  . cephALEXin (KEFLEX) 250 MG capsule Take 250 mg by mouth 2 (two) times daily.  0  . colchicine 0.6 MG tablet Take 0.6 mg by mouth daily.  0  . furosemide (LASIX) 20 MG tablet Take 20 mg by mouth daily.     . isosorbide mononitrate (IMDUR) 60 MG 24 hr tablet TAKE 1 AND 1/2 TABLETS BY MOUTH EVERY DAY 135 tablet 1  . losartan (COZAAR) 100 MG tablet TAKE 1 TABLET(100 MG) BY MOUTH DAILY 90 tablet 3  . nitroGLYCERIN (NITROSTAT) 0.4 MG SL tablet Place 1 tablet (0.4 mg total) under the tongue every 5 (five) minutes as needed for chest pain. 25 tablet 3  . Omega-3 Fatty Acids (OMEGA 3 PO) Take 2,000 Units by mouth 2 (two) times daily.     . simvastatin (ZOCOR) 20 MG tablet TAKE 1 TABLET BY MOUTH EVERY NIGHT AT BEDTIME 90 tablet 0  . traMADol (ULTRAM) 50 MG tablet Take 50 mg by mouth daily as needed (pain).     No current facility-administered medications for this visit.      Past Medical History:  Diagnosis Date  . Atrial fibrillation (HCC)    paroxysmal atrial fibrillation- stopped sotolol due to HR 44 (on 40mg  BID)  . Atrial fibrillation ([place])   . Back pain   . CAD (coronary artery disease)    s/p CABG 1999 with LIMA to MAD, SVG to first diagonal, SVG to RCA and PCI with DES mid LAD 06/09 and residual 70% D2 not amenable to PCI due to small caliber Cardiology Dr.  Marlou Porch  . Carotid artery stenosis     s/p bialteral CEA's 2004 with persistent bruit on left and minimal plaque by carotid dopplers 2010  . Dyslipidemia   . HTN (hypertension)   . Hypercholesteremia   . Hypersomnia   . RBBB   . Renal insufficiency     ROS:   All systems reviewed and negative except as noted in the HPI.   Past Surgical History:  Procedure Laterality Date  . CAROTID ENDARTERECTOMY Bilateral   . CHOLECYSTECTOMY    . CORONARY ARTERY BYPASS GRAFT    . EP IMPLANTABLE DEVICE N/A 08/27/2014   Procedure: PACEMAKER IMPLANT;  Surgeon: Belva Crome, MD;  Location: [facility] CATH LAB;  Service: Cardiovascular;  Laterality: N/A;  . HERNIA REPAIR    . INTRAOCULAR LENS INSERTION    . PERMANENT PACEMAKER INSERTION N/A 08/27/2014   Procedure: Daneil Dan;  Surgeon: Belva Crome, MD;  Location: [facility] CATH LAB;  Service: Cardiovascular;  Laterality: N/A;  . PERMANENT PACEMAKER INSERTION N/A 08/27/2014   Procedure: PERMANENT PACEMAKER INSERTION;  Surgeon: Evans Lance, MD;  Location: [facility] CATH LAB;  Service: Cardiovascular;  Laterality: N/A;     Family History  Problem Relation Age  of Onset  . Diabetes Mother   . Heart disease Father      Social History   Social History  . Marital status: Widowed    Spouse name: N/A  . Number of children: N/A  . Years of education: N/A   Occupational History  . Not on file.   Social History Main Topics  . Smoking status: Never Smoker  . Smokeless tobacco: Never Used  . Alcohol use No  . Drug use: No  . Sexual activity: Not on file   Other Topics Concern  . Not on file   Social History Narrative  . No narrative on file     BP 120/68   Pulse (!) 57   Ht 5\' 9"  (1.753 m)   Wt 158 lb 6.4 oz (71.8 kg)   BMI 23.39 kg/m   Physical Exam:  Well appearing elderly man, NAD HEENT: Unremarkable Neck:  5 cm JVD, no thyromegally Back:  No CVA tenderness Lungs:  Clear with no wheezes, rales or rhonchi HEART:  Regular rate rhythm, no  murmurs, no rubs, no clicks Abd:  soft, positive bowel sounds, no organomegally, no rebound, no guarding Ext:  2 plus pulses, no edema, no cyanosis, no clubbing Skin:  No rashes no nodules Neuro:  CN II through XII intact, motor grossly intact  DEVICE  Normal device function.  See PaceArt for details.   Assess/Plan: 1. Atrial fib - his ventricular rate is controlled. He will continue his current meds. 2. HTN - his blood pressure is well controlled. No change in meds. 3. PPM - his Medtronic single chamber PM is working normally. Will recheck in several months. 4. Gout - he is tolerating the colchicine. He is encouraged to avoid foods that exacerbate his gout.  [name]

## 2016-10-29 NOTE — Patient Instructions (Signed)
Medication Instructions:  Your physician recommends that you continue on your current medications as directed. Please refer to the Current Medication list given to you today.   Labwork: None Ordered   Testing/Procedures: None Ordered   Follow-Up: Your physician wants you to follow-up in: 1 year with Dr. Lovena Le. You will receive a reminder letter in the mail two months in advance. If you don't receive a letter, please call our office to schedule the follow-up appointment.  Remote monitoring is used to monitor your Pacemaker from home. This monitoring reduces the number of office visits required to check your device to one time per year. It allows [place] to keep an eye on the functioning of your device to ensure it is working properly. You are scheduled for a device check from home on  01/28/17 . You may send your transmission at any time that day. If you have a wireless device, the transmission will be sent automatically. After your physician reviews your transmission, you will receive a postcard with your next transmission date.    Any Other Special Instructions Will Be Listed Below (If Applicable).     If you need a refill on your cardiac medications before your next appointment, please call your pharmacy.

## 2016-11-09 DIAGNOSIS — N183 Chronic kidney disease, stage 3 (moderate): Secondary | ICD-10-CM | POA: Diagnosis not present

## 2016-11-09 DIAGNOSIS — Z0000 Encounter for general adult medical examination without abnormal findings: Secondary | ICD-10-CM | POA: Diagnosis not present

## 2016-11-09 DIAGNOSIS — M545 Low back pain: Secondary | ICD-10-CM | POA: Diagnosis not present

## 2016-11-09 DIAGNOSIS — M109 Gout, unspecified: Secondary | ICD-10-CM | POA: Diagnosis not present

## 2016-11-09 DIAGNOSIS — E7800 Pure hypercholesterolemia, unspecified: Secondary | ICD-10-CM | POA: Diagnosis not present

## 2016-11-09 DIAGNOSIS — Z950 Presence of cardiac pacemaker: Secondary | ICD-10-CM | POA: Diagnosis not present

## 2016-11-09 DIAGNOSIS — I10 Essential (primary) hypertension: Secondary | ICD-10-CM | POA: Diagnosis not present

## 2016-11-09 DIAGNOSIS — Z1389 Encounter for screening for other disorder: Secondary | ICD-10-CM | POA: Diagnosis not present

## 2016-11-09 DIAGNOSIS — I482 Chronic atrial fibrillation: Secondary | ICD-10-CM | POA: Diagnosis not present

## 2016-11-18 ENCOUNTER — Encounter: Payer: Self-pay | Admitting: Podiatry

## 2016-11-18 ENCOUNTER — Ambulatory Visit (INDEPENDENT_AMBULATORY_CARE_PROVIDER_SITE_OTHER): Payer: Medicare Other | Admitting: Podiatry

## 2016-11-18 DIAGNOSIS — B351 Tinea unguium: Secondary | ICD-10-CM

## 2016-11-18 DIAGNOSIS — M2041 Other hammer toe(s) (acquired), right foot: Secondary | ICD-10-CM | POA: Diagnosis not present

## 2016-11-18 DIAGNOSIS — M79676 Pain in unspecified toe(s): Secondary | ICD-10-CM | POA: Diagnosis not present

## 2016-11-20 NOTE — Progress Notes (Signed)
   SUBJECTIVE Patient  presents to office today complaining of elongated, thickened nails. Pain while ambulating in shoes. Patient is unable to trim their own nails.   OBJECTIVE General Patient is awake, alert, and oriented x 3 and in no acute distress. Derm Skin is dry and supple bilateral. Negative open lesions or macerations. Remaining integument unremarkable. Nails are tender, long, thickened and dystrophic with subungual debris, consistent with onychomycosis, 1-5 bilateral. No signs of infection noted. Vasc  DP and PT pedal pulses palpable bilaterally. Temperature gradient within normal limits.  Neuro Epicritic and protective threshold sensation diminished bilaterally.  Musculoskeletal Exam No symptomatic pedal deformities noted bilateral. Muscular strength within normal limits. There is some hammertoe contracture noted to the second digit of the right foot.  ASSESSMENT 1. Onychodystrophic nails 1-5 bilateral with hyperkeratosis of nails.  2. Onychomycosis of nail due to dermatophyte bilateral 3. Hammertoe second digit right foot  PLAN OF CARE 1. Patient evaluated today.  2. Instructed to maintain good pedal hygiene and foot care.  3. Mechanical debridement of nails 1-5 bilaterally performed using a nail nipper. Filed with dremel without incident.  4. Silicone toe caps were dispensed to address the hammertoe deformity to provide cushioning. 5. Return to clinic in 3 mos.    Edrick Kins, DPM [facility]  Dr. Edrick Kins, [address]                                        [place], [place] [address]                Office [phone]  Fax [phone]

## 2016-12-11 ENCOUNTER — Other Ambulatory Visit: Payer: Self-pay | Admitting: Cardiology

## 2017-01-27 ENCOUNTER — Ambulatory Visit (INDEPENDENT_AMBULATORY_CARE_PROVIDER_SITE_OTHER): Payer: Medicare Other | Admitting: Cardiology

## 2017-01-27 ENCOUNTER — Encounter: Payer: Self-pay | Admitting: Cardiology

## 2017-01-27 VITALS — BP 116/66 | HR 62 | Ht 71.0 in | Wt 159.8 lb

## 2017-01-27 DIAGNOSIS — I2583 Coronary atherosclerosis due to lipid rich plaque: Secondary | ICD-10-CM

## 2017-01-27 DIAGNOSIS — I10 Essential (primary) hypertension: Secondary | ICD-10-CM

## 2017-01-27 DIAGNOSIS — Z950 Presence of cardiac pacemaker: Secondary | ICD-10-CM

## 2017-01-27 DIAGNOSIS — I2510 Atherosclerotic heart disease of native coronary artery without angina pectoris: Secondary | ICD-10-CM | POA: Diagnosis not present

## 2017-01-27 DIAGNOSIS — G4733 Obstructive sleep apnea (adult) (pediatric): Secondary | ICD-10-CM

## 2017-01-27 MED ORDER — NITROGLYCERIN 0.4 MG SL SUBL: 0.4000 mg | SUBLINGUAL_TABLET | SUBLINGUAL | 3 refills | Status: AC | PRN

## 2017-01-27 NOTE — Progress Notes (Signed)
[facility]. [address], [address], [place]  [address] Phone: [phone] Fax:  [phone]  Date:  01/27/2017   ID:  Jesus Beck, DOB 19-Mar-1926, MRN [id number]  PCP:  Seward Carol, MD   History of Present Illness: Jesus Beck is a 83 y.o. male with coronary artery disease status post CABG in 1999 with hypertension, hyperlipidemia, OSA-noncompliant with mask, paroxysmal atrial fibrillation previously on sotalol.  [employer]. Gave me a pen made out of pinecone- that his brother made.  He is currently asymptomatic in relation to his atrial fibrillation. He has been doing well, no syncope, no strokelike symptoms. We discussed at length risk of stroke, anticoagulation needs. Discussed with pharmacy.  Occasionally he would get ankle edema. He was placed on HCTZ for that. He did develop a rash along his chest wall and this medication was stopped several months ago. He saw dermatologist for the rash as well. Unsure what was the ultimate cause for the rash. I've asked him to stop his HCTZ in the past.  Eliquis low dose started early December 2015. Doing well. Feels well.  Pacemaker for HR 20's. Feels much stronger. Since pacer doing well.  Legs weak, balance. No falls. Right leg wose with edema than left. Now takes lasix.   Fallen twice in 2017. Has a lift chair at home, tripped over it. Staying quite active. Trimming hedges for instance. Fell over when sitting on ground. Watches falling backwards.   No CP, positive leg back pain. Denies any other significant symptoms.  Wt Readings from Last 3 Encounters:  01/27/17 159 lb 12.8 oz (72.5 kg)  10/29/16 158 lb 6.4 oz (71.8 kg)  09/14/16 165 lb 12.8 oz (75.2 kg)     Past Medical History:  Diagnosis Date  . Atrial fibrillation (HCC)    paroxysmal atrial fibrillation- stopped sotolol due to HR 44 (on 40mg  BID)  . Atrial fibrillation ([place])   . Back pain   . CAD (coronary artery disease)    s/p CABG 1999 with LIMA to MAD,  SVG to first diagonal, SVG to RCA and PCI with DES mid LAD 06/09 and residual 70% D2 not amenable to PCI due to small caliber Cardiology Dr. Marlou Porch  . Carotid artery stenosis     s/p bialteral CEA's 2004 with persistent bruit on left and minimal plaque by carotid dopplers 2010  . Dyslipidemia   . HTN (hypertension)   . Hypercholesteremia   . Hypersomnia   . RBBB   . Renal insufficiency     Past Surgical History:  Procedure Laterality Date  . CAROTID ENDARTERECTOMY Bilateral   . CHOLECYSTECTOMY    . CORONARY ARTERY BYPASS GRAFT    . EP IMPLANTABLE DEVICE N/A 08/27/2014   Procedure: PACEMAKER IMPLANT;  Surgeon: Belva Crome, MD;  Location: [facility] C[facility] LAB;  Service: Cardiovascular;  Laterality: N/A;  . HERNIA REPAIR    . INTRAOCULAR LENS INSERTION    . PERMANENT PACEMAKER INSERTION N/A 08/27/2014   Procedure: Daneil Dan;  Surgeon: Belva Crome, MD;  Location: [facility] C[facility] LAB;  Service: Cardiovascular;  Laterality: N/A;  . PERMANENT PACEMAKER INSERTION N/A 08/27/2014   Procedure: PERMANENT PACEMAKER INSERTION;  Surgeon: Evans Lance, MD;  Location: [facility] CATH LAB;  Service: Cardiovascular;  Laterality: N/A;    Current Outpatient Prescriptions  Medication Sig Dispense Refill  . amLODipine (NORVASC) 5 MG tablet TAKE 1 TABLET BY MOUTH EVERY DAY 90 tablet 1  . apixaban (ELIQUIS)  2.5 MG TABS tablet Take 1 tablet (2.5 mg total) by mouth 2 (two) times daily. 180 tablet 1  . furosemide (LASIX) 20 MG tablet Take 20 mg by mouth daily.     . isosorbide mononitrate (IMDUR) 60 MG 24 hr tablet TAKE 1 AND 1/2 TABLETS BY MOUTH EVERY DAY 135 tablet 0  . losartan (COZAAR) 100 MG tablet TAKE 1 TABLET(100 MG) BY MOUTH DAILY 90 tablet 0  . nitroGLYCERIN (NITROSTAT) 0.4 MG SL tablet Place 1 tablet (0.4 mg total) under the tongue every 5 (five) minutes as needed for chest pain. 25 tablet 3  . Omega-3 Fatty Acids (OMEGA 3 PO) Take 2,000 Units by mouth 2 (two) times daily.     . simvastatin (ZOCOR) 20 MG tablet TAKE 1  TABLET BY MOUTH EVERY NIGHT AT BEDTIME 90 tablet 0  . traMADol (ULTRAM) 50 MG tablet Take 50 mg by mouth daily as needed (pain).     No current facility-administered medications for this visit.     Allergies:    Allergies  Allergen Reactions  . Lipitor [Atorvastatin] Hives    Social History:  The patient  reports that he has never smoked. He has never used smokeless tobacco. He reports that he does not drink alcohol or use drugs.   ROS:  Please see the history of present illness.   Denies any bleeding, syncope, orthopnea, PND. Low back pain. No syncope, no CP, no SOB.   PHYSICAL EXAM: VS:  BP 116/66   Pulse 62   Ht 5\' 11"  (1.803 m)   Wt 159 lb 12.8 oz (72.5 kg)   BMI 22.29 kg/m    GEN: Well nourished, well developed, in no acute distress  HEENT: normal  Neck: no JVD, carotid bruits, or masses Cardiac: Regular rate and rhythm; no murmurs, rubs, or gallops, chronic appearing mostly right sided edema pacemaker noted Respiratory:  clear to auscultation bilaterally, normal work of breathing GI: soft, nontender, nondistended, + BS MS: no deformity or atrophy  Skin: warm and dry, no rash Neuro:  Alert and Oriented x 3, Strength and sensation are intact Psych: euthymic mood, full affect     EKG:   EKG ordered today 01/27/17-ventricular paced rhythm 62 with no other abnormalities personally viewed-prior 01/13/16-ventricular pacing 71 bpm with occasional intrinsic beat, atrial fibrillation underlying rhythm, T-wave inversion inferior lateral leads. No significant change from prior.. Previous 01/01/15 - V paced. Personally viewed. Prior- Atrial fibrillation heart rate 87 with right bundle branch block , left posterior fascicular block     labs reviewed from Dr. Lina Sar office.    ASSESSMENT AND PLAN:  1. Symptomatic bradycardia/atrial fibrillation with slow ventricular response heart rate in the 20s--pacemaker placed on 08/27/14 by Dr. Lovena Le. Single-chamber pacemaker, Medtronic  placed. Doing very well. He feels much better with his pacemaker. Doing well. No changes. 2. Chronic atrial fibrillation-was previously on sotalol but this has been stopped since pacemaker. Atrial fibrillation noted on prior EKG. Currently well rate controlled. No longer on sotalol. Doing well. Heart rate stable with pacemaker 3. Chronic anticoagulation- Eliquis 2.5 mg twice a day for about a month. No bleeding. Overall doing well. Dr. Delfina Redwood recently checked blood work. Since he has been taking it, feels better.  On lower dose because of creatinine greater than 1.5 and age greater than 102. No bruising, no bleeding. Last creatinine 1.6, hemoglobin 13.2 4. Coronary artery disease-prior CABG , isosorbide for antianginal, doing well. No changes. 5.  angina- symptoms well-controlled post bypass as well as  isosorbide, doing well without any significant anginal symptoms. 6. Carotid artery disease-prior endarterectomy-last vascular ultrasound in 2010 showed minimal plaque doing well. 7. Obstructive sleep apnea -- unfortunately did not tolerate the mask well. 8. Hypertension-currently doing well. Continue with current meds.  I contemplated decreasing his losartan but at previous visit he had been minimally elevated. Overall doing well today. 9. Hyperlipidemia-continue with simvastatin. LDL 59. Excellent. 10. Mild ankle edema-compression hose, elevation. Right greater than left 11. We will see back in 12  Signed, Candee Furbish, MD [facility]  01/27/2017 11:13 AM

## 2017-01-27 NOTE — Patient Instructions (Signed)

## 2017-01-28 ENCOUNTER — Telehealth: Payer: Self-pay | Admitting: Cardiology

## 2017-01-28 ENCOUNTER — Encounter: Payer: Medicare Other | Admitting: *Deleted

## 2017-01-28 NOTE — Telephone Encounter (Signed)
Attempted to confirm remote transmission with pt. No answer and was unable to leave a message.  Automated message stating that number is no longer in service.

## 2017-01-29 ENCOUNTER — Encounter: Payer: Self-pay | Admitting: Cardiology

## 2017-02-09 ENCOUNTER — Ambulatory Visit (INDEPENDENT_AMBULATORY_CARE_PROVIDER_SITE_OTHER): Payer: Self-pay | Admitting: *Deleted

## 2017-02-09 DIAGNOSIS — I482 Chronic atrial fibrillation, unspecified: Secondary | ICD-10-CM

## 2017-02-09 NOTE — Progress Notes (Signed)
Patient seen in office today with help using home monitor. Patient attempted to troubleshoot with tech services unsuccessfully. In office I continued to get [id number] and [id number] alerts. Will order patient a new home monitor. Home address confirmed. Patient agreeable to this plan.

## 2017-02-14 ENCOUNTER — Other Ambulatory Visit: Payer: Self-pay | Admitting: Cardiology

## 2017-02-22 ENCOUNTER — Ambulatory Visit: Payer: Medicare Other | Admitting: Podiatry

## 2017-02-22 ENCOUNTER — Ambulatory Visit (INDEPENDENT_AMBULATORY_CARE_PROVIDER_SITE_OTHER): Payer: Medicare Other | Admitting: *Deleted

## 2017-02-22 DIAGNOSIS — I442 Atrioventricular block, complete: Secondary | ICD-10-CM

## 2017-02-22 NOTE — Progress Notes (Signed)
Remote pacemaker transmission.   

## 2017-02-24 LAB — CUP PACEART REMOTE DEVICE CHECK
Brady Statistic RV Percent Paced: 100 %
Implantable Lead Model: 5076
Lead Channel Impedance Value: 0 Ohm
Lead Channel Impedance Value: 595 Ohm
Lead Channel Pacing Threshold Amplitude: 0.625 V
Lead Channel Setting Pacing Amplitude: 2.25 V
Lead Channel Setting Pacing Pulse Width: 0.4 ms
MDC IDC LEAD IMPLANT DT: 20160425
MDC IDC LEAD LOCATION: 753860
MDC IDC MSMT BATTERY IMPEDANCE: 453 Ohm
MDC IDC MSMT BATTERY REMAINING LONGEVITY: 86 mo
MDC IDC MSMT BATTERY VOLTAGE: 2.77 V
MDC IDC MSMT LEADCHNL RV PACING THRESHOLD PULSEWIDTH: 0.4 ms
MDC IDC PG IMPLANT DT: 20160425
MDC IDC SESS DTM: 20181022191123
MDC IDC SET LEADCHNL RV SENSING SENSITIVITY: 4 mV

## 2017-02-26 ENCOUNTER — Encounter: Payer: Self-pay | Admitting: Cardiology

## 2017-03-02 DIAGNOSIS — Z23 Encounter for immunization: Secondary | ICD-10-CM | POA: Diagnosis not present

## 2017-03-10 ENCOUNTER — Other Ambulatory Visit: Payer: Self-pay | Admitting: Cardiology

## 2017-03-16 ENCOUNTER — Encounter (INDEPENDENT_AMBULATORY_CARE_PROVIDER_SITE_OTHER): Payer: Medicare Other | Admitting: Podiatry

## 2017-03-16 NOTE — Progress Notes (Signed)
This encounter was created in error - please disregard.

## 2017-03-31 DIAGNOSIS — M5431 Sciatica, right side: Secondary | ICD-10-CM | POA: Diagnosis not present

## 2017-05-13 ENCOUNTER — Other Ambulatory Visit: Payer: Self-pay | Admitting: Internal Medicine

## 2017-05-13 DIAGNOSIS — E7800 Pure hypercholesterolemia, unspecified: Secondary | ICD-10-CM | POA: Diagnosis not present

## 2017-05-13 DIAGNOSIS — I482 Chronic atrial fibrillation: Secondary | ICD-10-CM | POA: Diagnosis not present

## 2017-05-13 DIAGNOSIS — R600 Localized edema: Secondary | ICD-10-CM | POA: Diagnosis not present

## 2017-05-13 DIAGNOSIS — R0989 Other specified symptoms and signs involving the circulatory and respiratory systems: Secondary | ICD-10-CM

## 2017-05-13 DIAGNOSIS — I10 Essential (primary) hypertension: Secondary | ICD-10-CM | POA: Diagnosis not present

## 2017-05-13 DIAGNOSIS — N183 Chronic kidney disease, stage 3 (moderate): Secondary | ICD-10-CM | POA: Diagnosis not present

## 2017-05-14 ENCOUNTER — Telehealth: Payer: Self-pay | Admitting: Cardiology

## 2017-05-14 NOTE — Telephone Encounter (Signed)
Walk in Pt Form-[employer] paperwork dropped off gave to [employer] v.

## 2017-05-17 ENCOUNTER — Telehealth: Payer: Self-pay

## 2017-05-17 ENCOUNTER — Other Ambulatory Visit: Payer: Self-pay

## 2017-05-17 ENCOUNTER — Ambulatory Visit
Admission: RE | Admit: 2017-05-17 | Discharge: 2017-05-17 | Disposition: A | Discharge status: Home / Self Care | Payer: Medicare Other | Source: Ambulatory Visit | Attending: Internal Medicine | Admitting: Internal Medicine

## 2017-05-17 DIAGNOSIS — I6523 Occlusion and stenosis of bilateral carotid arteries: Secondary | ICD-10-CM | POA: Diagnosis not present

## 2017-05-17 DIAGNOSIS — R0989 Other specified symptoms and signs involving the circulatory and respiratory systems: Secondary | ICD-10-CM

## 2017-05-17 MED ORDER — APIXABAN 2.5 MG PO TABS: 2.5000 mg | ORAL_TABLET | Freq: Two times a day (BID) | ORAL | 3 refills | Status: DC

## 2017-05-17 NOTE — Telephone Encounter (Signed)
The pt dropped off the providers part of his [place] pt assistance application at the front office. The pts part of the application was not included.  I have completed the providers part of the application, printed an Eliquis RX and placed both in Dr Marlou Porch mail bin awaiting his signature.  Will contact the pt once Dr Marlou Porch signs application and RX.

## 2017-05-21 ENCOUNTER — Telehealth: Payer: Self-pay | Admitting: Cardiology

## 2017-05-21 ENCOUNTER — Other Ambulatory Visit: Payer: Self-pay | Admitting: *Deleted

## 2017-05-21 MED ORDER — APIXABAN 2.5 MG PO TABS: 2.5000 mg | ORAL_TABLET | Freq: Two times a day (BID) | ORAL | 2 refills | Status: DC

## 2017-05-21 NOTE — Telephone Encounter (Signed)
New Message   Patient calling the office for samples of medication:   1.  What medication and dosage are you requesting samples for? Eliquis 5mg    2.  Are you currently out of this medication? He has 3 days worth remaining.

## 2017-05-21 NOTE — Telephone Encounter (Signed)
**Note De-Identified Jesus Beck Obfuscation** The pt came to the phone and gave verbal permission for me to speak with his daughter, Jesus Beck, concerning his Eliquis.  Jesus Beck is advised that I have completed the providers part of the pt assistance application and placed it in Dr Marlou Porch mail bin awaiting his signature.  She is also advised that I will contact her as soon as Dr Marlou Porch signs the pt assistance application so she can pick up and mail to [employer] along with the pts part of application, 3% out of pocket expense report from pharmacy and his proof of income.

## 2017-05-24 ENCOUNTER — Telehealth: Payer: Self-pay | Admitting: Cardiology

## 2017-05-24 ENCOUNTER — Ambulatory Visit (INDEPENDENT_AMBULATORY_CARE_PROVIDER_SITE_OTHER): Payer: Medicare Other | Admitting: *Deleted

## 2017-05-24 DIAGNOSIS — I442 Atrioventricular block, complete: Secondary | ICD-10-CM

## 2017-05-24 NOTE — Progress Notes (Signed)
Remote pacemaker transmission.   

## 2017-05-24 NOTE — Telephone Encounter (Signed)
Spoke with pt and reminded pt of remote transmission that is due today. Pt verbalized understanding.   

## 2017-05-25 ENCOUNTER — Other Ambulatory Visit: Payer: Self-pay | Admitting: *Deleted

## 2017-05-25 ENCOUNTER — Encounter: Payer: Self-pay | Admitting: Cardiology

## 2017-05-25 DIAGNOSIS — M10072 Idiopathic gout, left ankle and foot: Secondary | ICD-10-CM | POA: Diagnosis not present

## 2017-05-25 MED ORDER — APIXABAN 2.5 MG PO TABS: 2.5000 mg | ORAL_TABLET | Freq: Two times a day (BID) | ORAL | 3 refills | Status: DC

## 2017-05-26 LAB — CUP PACEART REMOTE DEVICE CHECK
Battery Remaining Longevity: 82 mo
Battery Voltage: 2.76 V
Date Time Interrogation Session: 20190121205102
Implantable Lead Location: 753860
Implantable Lead Model: 5076
Implantable Pulse Generator Implant Date: 20160425
Lead Channel Impedance Value: 0 Ohm
Lead Channel Pacing Threshold Pulse Width: 0.4 ms
Lead Channel Setting Pacing Amplitude: 2.25 V
Lead Channel Setting Sensing Sensitivity: 4 mV
MDC IDC LEAD IMPLANT DT: 20160425
MDC IDC MSMT BATTERY IMPEDANCE: 504 Ohm
MDC IDC MSMT LEADCHNL RV IMPEDANCE VALUE: 546 Ohm
MDC IDC MSMT LEADCHNL RV PACING THRESHOLD AMPLITUDE: 0.625 V
MDC IDC SET LEADCHNL RV PACING PULSEWIDTH: 0.4 ms
MDC IDC STAT BRADY RV PERCENT PACED: 99 %

## 2017-05-26 NOTE — Telephone Encounter (Signed)
**Note De-Identified Rosene Pilling Obfuscation** The pt and his daughter, Caren Griffins, are both advised that Dr Marlou Porch has signed his [employer] pt assistance application and the RX for Eliquis and that they may pick up at anytime M-F between 8am and 5 pm. Caren Griffins states that she plans to pick up tomorrow.  I have placed the [employer] pt assistance application and RX for Eliquis in an envelope and have left in the front office.

## 2017-06-29 NOTE — Telephone Encounter (Addendum)
Approval received from [employer] pt assistance program on the pts Eliquis. Approval good from 06/21/2017 until 05/03/2018.

## 2017-07-14 DIAGNOSIS — J0110 Acute frontal sinusitis, unspecified: Secondary | ICD-10-CM | POA: Diagnosis not present

## 2017-07-14 DIAGNOSIS — R42 Dizziness and giddiness: Secondary | ICD-10-CM | POA: Diagnosis not present

## 2017-08-23 ENCOUNTER — Ambulatory Visit (INDEPENDENT_AMBULATORY_CARE_PROVIDER_SITE_OTHER): Payer: Medicare Other | Admitting: *Deleted

## 2017-08-23 ENCOUNTER — Telehealth: Payer: Self-pay | Admitting: Cardiology

## 2017-08-23 DIAGNOSIS — I442 Atrioventricular block, complete: Secondary | ICD-10-CM | POA: Diagnosis not present

## 2017-08-23 NOTE — Progress Notes (Signed)
Remote pacemaker transmission.   

## 2017-08-23 NOTE — Telephone Encounter (Signed)
Spoke with pt and reminded pt of remote transmission that is due today. Pt verbalized understanding.   

## 2017-08-24 ENCOUNTER — Encounter: Payer: Self-pay | Admitting: Cardiology

## 2017-08-24 LAB — CUP PACEART REMOTE DEVICE CHECK
Brady Statistic RV Percent Paced: 99 %
Implantable Lead Implant Date: 20160425
Lead Channel Impedance Value: 0 Ohm
Lead Channel Impedance Value: 601 Ohm
Lead Channel Pacing Threshold Amplitude: 0.625 V
Lead Channel Pacing Threshold Pulse Width: 0.4 ms
Lead Channel Setting Pacing Amplitude: 2.25 V
Lead Channel Setting Pacing Pulse Width: 0.4 ms
MDC IDC LEAD LOCATION: 753860
MDC IDC MSMT BATTERY IMPEDANCE: 579 Ohm
MDC IDC MSMT BATTERY REMAINING LONGEVITY: 78 mo
MDC IDC MSMT BATTERY VOLTAGE: 2.76 V
MDC IDC PG IMPLANT DT: 20160425
MDC IDC SESS DTM: 20190422194643
MDC IDC SET LEADCHNL RV SENSING SENSITIVITY: 4 mV

## 2017-09-10 DIAGNOSIS — M79641 Pain in right hand: Secondary | ICD-10-CM | POA: Diagnosis not present

## 2017-11-08 ENCOUNTER — Encounter: Payer: Self-pay | Admitting: Internal Medicine

## 2017-11-15 DIAGNOSIS — Z0000 Encounter for general adult medical examination without abnormal findings: Secondary | ICD-10-CM | POA: Diagnosis not present

## 2017-11-15 DIAGNOSIS — I10 Essential (primary) hypertension: Secondary | ICD-10-CM | POA: Diagnosis not present

## 2017-11-15 DIAGNOSIS — Z1389 Encounter for screening for other disorder: Secondary | ICD-10-CM | POA: Diagnosis not present

## 2017-11-15 DIAGNOSIS — E7800 Pure hypercholesterolemia, unspecified: Secondary | ICD-10-CM | POA: Diagnosis not present

## 2017-11-15 DIAGNOSIS — N183 Chronic kidney disease, stage 3 (moderate): Secondary | ICD-10-CM | POA: Diagnosis not present

## 2017-11-15 DIAGNOSIS — I482 Chronic atrial fibrillation: Secondary | ICD-10-CM | POA: Diagnosis not present

## 2017-11-15 DIAGNOSIS — M545 Low back pain: Secondary | ICD-10-CM | POA: Diagnosis not present

## 2017-11-22 ENCOUNTER — Ambulatory Visit (INDEPENDENT_AMBULATORY_CARE_PROVIDER_SITE_OTHER): Payer: Medicare Other | Admitting: *Deleted

## 2017-11-22 ENCOUNTER — Encounter (INDEPENDENT_AMBULATORY_CARE_PROVIDER_SITE_OTHER): Payer: Self-pay

## 2017-11-22 ENCOUNTER — Ambulatory Visit (INDEPENDENT_AMBULATORY_CARE_PROVIDER_SITE_OTHER): Payer: Medicare Other | Admitting: Internal Medicine

## 2017-11-22 ENCOUNTER — Encounter: Payer: Self-pay | Admitting: Internal Medicine

## 2017-11-22 VITALS — BP 112/68 | HR 61 | Ht 71.0 in | Wt 153.6 lb

## 2017-11-22 DIAGNOSIS — I442 Atrioventricular block, complete: Secondary | ICD-10-CM | POA: Diagnosis not present

## 2017-11-22 DIAGNOSIS — Z950 Presence of cardiac pacemaker: Secondary | ICD-10-CM | POA: Diagnosis not present

## 2017-11-22 DIAGNOSIS — I482 Chronic atrial fibrillation, unspecified: Secondary | ICD-10-CM

## 2017-11-22 LAB — CUP PACEART INCLINIC DEVICE CHECK
Battery Remaining Longevity: 72 mo
Battery Voltage: 2.76 V
Brady Statistic RV Percent Paced: 99 %
Implantable Lead Location: 753860
Implantable Lead Model: 5076
Lead Channel Setting Pacing Amplitude: 2.25 V
Lead Channel Setting Pacing Pulse Width: 0.4 ms
MDC IDC LEAD IMPLANT DT: 20160425
MDC IDC MSMT BATTERY IMPEDANCE: 681 Ohm
MDC IDC MSMT LEADCHNL RA IMPEDANCE VALUE: 0 Ohm
MDC IDC MSMT LEADCHNL RV IMPEDANCE VALUE: 551 Ohm
MDC IDC MSMT LEADCHNL RV PACING THRESHOLD AMPLITUDE: 0.75 V
MDC IDC MSMT LEADCHNL RV PACING THRESHOLD PULSEWIDTH: 0.4 ms
MDC IDC PG IMPLANT DT: 20160425
MDC IDC SESS DTM: 20190722171547
MDC IDC SET LEADCHNL RV SENSING SENSITIVITY: 4 mV

## 2017-11-22 NOTE — Progress Notes (Signed)
Remote pacemaker transmission.   

## 2017-11-22 NOTE — Progress Notes (Signed)
HPI Mr. Zorn returns today for followup. He is a pleasant 84 yo man with a h/o atrial fibrillation and complete heart block, s/p PPM insertion, also with CAD, s/p CABG. In the interim he has been bothered by some arthritis. No chest pain or sob. He is using a walking stick to keep from falling.   Allergies  Allergen Reactions  . Lipitor [Atorvastatin] Hives     Current Outpatient Medications  Medication Sig Dispense Refill  . amLODipine (NORVASC) 5 MG tablet TAKE 1 TABLET BY MOUTH EVERY DAY 90 tablet 3  . apixaban (ELIQUIS) 2.5 MG TABS tablet Take 1 tablet (2.5 mg total) by mouth 2 (two) times daily. 60 tablet 3  . furosemide (LASIX) 20 MG tablet Take 20 mg by mouth daily.     . isosorbide mononitrate (IMDUR) 60 MG 24 hr tablet TAKE 1 AND 1/2 TABLETS BY MOUTH EVERY DAY 135 tablet 2  . losartan (COZAAR) 100 MG tablet TAKE 1 TABLET(100 MG) BY MOUTH DAILY 90 tablet 2  . nitroGLYCERIN (NITROSTAT) 0.4 MG SL tablet Place 1 tablet (0.4 mg total) under the tongue every 5 (five) minutes as needed for chest pain. 25 tablet 3  . Omega-3 Fatty Acids (OMEGA 3 PO) Take 2,000 Units by mouth 2 (two) times daily.     . simvastatin (ZOCOR) 20 MG tablet TAKE 1 TABLET BY MOUTH EVERY NIGHT AT BEDTIME 90 tablet 0  . traMADol (ULTRAM) 50 MG tablet Take 50 mg by mouth daily as needed (pain).     No current facility-administered medications for this visit.      Past Medical History:  Diagnosis Date  . Atrial fibrillation (HCC)    paroxysmal atrial fibrillation- stopped sotolol due to HR 44 (on 40mg  BID)  . Atrial fibrillation ([place])   . Back pain   . CAD (coronary artery disease)    s/p CABG 1999 with LIMA to MAD, SVG to first diagonal, SVG to RCA and PCI with DES mid LAD 06/09 and residual 70% D2 not amenable to PCI due to small caliber Cardiology Dr. Marlou Porch  . Carotid artery stenosis     s/p bialteral CEA's 2004 with persistent bruit on left and minimal plaque by carotid dopplers 2010  .  Dyslipidemia   . HTN (hypertension)   . Hypercholesteremia   . Hypersomnia   . RBBB   . Renal insufficiency     ROS:   All systems reviewed and negative except as noted in the HPI.   Past Surgical History:  Procedure Laterality Date  . CAROTID ENDARTERECTOMY Bilateral   . CHOLECYSTECTOMY    . CORONARY ARTERY BYPASS GRAFT    . EP IMPLANTABLE DEVICE N/A 08/27/2014   Procedure: PACEMAKER IMPLANT;  Surgeon: Belva Crome, MD;  Location: [facility] C[facility] LAB;  Service: Cardiovascular;  Laterality: N/A;  . HERNIA REPAIR    . INTRAOCULAR LENS INSERTION    . PERMANENT PACEMAKER INSERTION N/A 08/27/2014   Procedure: Daneil Dan;  Surgeon: Belva Crome, MD;  Location: [facility] CATH LAB;  Service: Cardiovascular;  Laterality: N/A;  . PERMANENT PACEMAKER INSERTION N/A 08/27/2014   Procedure: PERMANENT PACEMAKER INSERTION;  Surgeon: Evans Lance, MD;  Location: [facility] CATH LAB;  Service: Cardiovascular;  Laterality: N/A;     Family History  Problem Relation Age of Onset  . Diabetes Mother   . Heart disease Father      Social History   Socioeconomic History  . Marital status: Widowed    Spouse  name: Not on file  . Number of children: Not on file  . Years of education: Not on file  . Highest education level: Not on file  Occupational History  . Not on file  Social Needs  . Financial resource strain: Not on file  . Food insecurity:    Worry: Not on file    Inability: Not on file  . Transportation needs:    Medical: Not on file    Non-medical: Not on file  Tobacco Use  . Smoking status: Never Smoker  . Smokeless tobacco: Never Used  Substance and Sexual Activity  . Alcohol use: No  . Drug use: No  . Sexual activity: Not on file  Lifestyle  . Physical activity:    Days per week: Not on file    Minutes per session: Not on file  . Stress: Not on file  Relationships  . Social connections:    Talks on phone: Not on file    Gets together: Not on file    Attends religious service: Not on  file    Active member of club or organization: Not on file    Attends meetings of clubs or organizations: Not on file    Relationship status: Not on file  . Intimate partner violence:    Fear of current or ex partner: Not on file    Emotionally abused: Not on file    Physically abused: Not on file    Forced sexual activity: Not on file  Other Topics Concern  . Not on file  Social History Narrative  . Not on file     BP 112/68   Pulse 61   Ht 5\' 11"  (1.803 m)   Wt 153 lb 9.6 oz (69.7 kg)   BMI 21.42 kg/m   Physical Exam:  Well appearing NAD HEENT: Unremarkable Neck:  No JVD, no thyromegally Lymphatics:  No adenopathy Back:  No CVA tenderness Lungs:  Clear with no wheezes HEART:  Regular rate rhythm, no murmurs, no rubs, no clicks Abd:  soft, positive bowel sounds, no organomegally, no rebound, no guarding Ext:  2 plus pulses, no edema, no cyanosis, no clubbing Skin:  No rashes no nodules Neuro:  CN II through XII intact, motor grossly intact  EKG - atrial fib with ventricular pacing  DEVICE  Normal device function.  See PaceArt for details.   Assess/Plan: 1. Atrial fib - his ventricular rate is well controlled. No change in meds. 2. PPM - he is s/p VVI PM for CHB. He is dependent. His medtronic single chamber PPM is working normally. 3. HTN - his blood pressure is well controlled on medical therapy.   [name]

## 2017-11-22 NOTE — Patient Instructions (Addendum)
Medication Instructions:  The current medical regimen is effective;  continue present plan and medications.  Follow-Up: Follow up in 1 year with Dr. Lovena Le.  You will receive a letter in the mail 2 months before you are due.  Please call us when you receive this letter to schedule your follow up appointment.  Remote monitoring is used to monitor your Pacemaker of ICD from home. This monitoring reduces the number of office visits required to check your device to one time per year. It allows [place] to keep an eye on the functioning of your device to ensure it is working properly. You are scheduled for a device check from home on 02/21/18 You may send your transmission at any time that day. If you have a wireless device, the transmission will be sent automatically. After your physician reviews your transmission, you will receive a postcard with your next transmission date.  If you need a refill on your cardiac medications before your next appointment, please call your pharmacy.  Thank you for choosing [facility]!!

## 2017-11-23 ENCOUNTER — Encounter: Payer: Self-pay | Admitting: Cardiology

## 2017-12-06 ENCOUNTER — Other Ambulatory Visit: Payer: Self-pay | Admitting: Cardiology

## 2017-12-18 LAB — CUP PACEART REMOTE DEVICE CHECK
Brady Statistic RV Percent Paced: 99 %
Date Time Interrogation Session: 20190722135723
Implantable Lead Implant Date: 20160425
Lead Channel Impedance Value: 573 Ohm
Lead Channel Pacing Threshold Amplitude: 0.625 V
Lead Channel Setting Pacing Amplitude: 2.25 V
Lead Channel Setting Pacing Pulse Width: 0.4 ms
Lead Channel Setting Sensing Sensitivity: 4 mV
MDC IDC LEAD LOCATION: 753860
MDC IDC MSMT BATTERY IMPEDANCE: 680 Ohm
MDC IDC MSMT BATTERY REMAINING LONGEVITY: 72 mo
MDC IDC MSMT BATTERY VOLTAGE: 2.76 V
MDC IDC MSMT LEADCHNL RA IMPEDANCE VALUE: 0 Ohm
MDC IDC MSMT LEADCHNL RV PACING THRESHOLD PULSEWIDTH: 0.4 ms
MDC IDC PG IMPLANT DT: 20160425

## 2017-12-26 ENCOUNTER — Other Ambulatory Visit: Payer: Self-pay | Admitting: Cardiology

## 2017-12-27 ENCOUNTER — Other Ambulatory Visit: Payer: Self-pay | Admitting: Cardiology

## 2018-01-18 DIAGNOSIS — Z23 Encounter for immunization: Secondary | ICD-10-CM | POA: Diagnosis not present

## 2018-01-24 ENCOUNTER — Other Ambulatory Visit (HOSPITAL_COMMUNITY): Payer: Self-pay | Admitting: Nurse Practitioner

## 2018-01-24 ENCOUNTER — Ambulatory Visit
Admission: RE | Admit: 2018-01-24 | Discharge: 2018-01-24 | Disposition: A | Discharge status: Home / Self Care | Payer: Medicare Other | Source: Ambulatory Visit | Attending: Nurse Practitioner | Admitting: Nurse Practitioner

## 2018-01-24 ENCOUNTER — Other Ambulatory Visit: Payer: Self-pay | Admitting: Nurse Practitioner

## 2018-01-24 DIAGNOSIS — M25562 Pain in left knee: Secondary | ICD-10-CM | POA: Diagnosis not present

## 2018-01-24 DIAGNOSIS — R7989 Other specified abnormal findings of blood chemistry: Secondary | ICD-10-CM

## 2018-01-24 DIAGNOSIS — M1712 Unilateral primary osteoarthritis, left knee: Secondary | ICD-10-CM | POA: Diagnosis not present

## 2018-01-24 DIAGNOSIS — M109 Gout, unspecified: Secondary | ICD-10-CM

## 2018-01-24 DIAGNOSIS — M25561 Pain in right knee: Secondary | ICD-10-CM | POA: Diagnosis not present

## 2018-01-24 DIAGNOSIS — M1711 Unilateral primary osteoarthritis, right knee: Secondary | ICD-10-CM | POA: Diagnosis not present

## 2018-01-25 ENCOUNTER — Ambulatory Visit (HOSPITAL_COMMUNITY)
Admission: RE | Admit: 2018-01-25 | Discharge: 2018-01-25 | Disposition: A | Discharge status: Home / Self Care | Payer: Medicare Other | Source: Ambulatory Visit | Attending: Internal Medicine | Admitting: Internal Medicine

## 2018-01-25 DIAGNOSIS — M7989 Other specified soft tissue disorders: Secondary | ICD-10-CM | POA: Diagnosis not present

## 2018-01-25 DIAGNOSIS — M25561 Pain in right knee: Secondary | ICD-10-CM | POA: Diagnosis not present

## 2018-01-25 DIAGNOSIS — R7989 Other specified abnormal findings of blood chemistry: Secondary | ICD-10-CM | POA: Diagnosis not present

## 2018-01-25 NOTE — Progress Notes (Signed)
RLE venous duplex prelim: negative for DVT.  Landry Mellow, RDMS, RVT  Attempted call report to Dr. Clemetine Marker.. Left message with results.

## 2018-02-07 DIAGNOSIS — M109 Gout, unspecified: Secondary | ICD-10-CM | POA: Diagnosis not present

## 2018-02-21 ENCOUNTER — Ambulatory Visit (INDEPENDENT_AMBULATORY_CARE_PROVIDER_SITE_OTHER): Payer: Medicare Other | Admitting: *Deleted

## 2018-02-21 ENCOUNTER — Telehealth: Payer: Self-pay

## 2018-02-21 DIAGNOSIS — I442 Atrioventricular block, complete: Secondary | ICD-10-CM

## 2018-02-21 NOTE — Telephone Encounter (Signed)
Spoke with pt and reminded pt of remote transmission that is due today. Pt verbalized understanding.   

## 2018-02-22 ENCOUNTER — Encounter: Payer: Self-pay | Admitting: Cardiology

## 2018-02-22 NOTE — Progress Notes (Signed)
Remote pacemaker transmission.   

## 2018-03-02 ENCOUNTER — Other Ambulatory Visit: Payer: Self-pay | Admitting: Cardiology

## 2018-03-15 LAB — CUP PACEART REMOTE DEVICE CHECK
Battery Impedance: 886 Ohm
Brady Statistic RV Percent Paced: 99 %
Implantable Lead Implant Date: 20160425
Lead Channel Impedance Value: 0 Ohm
Lead Channel Impedance Value: 605 Ohm
Lead Channel Pacing Threshold Amplitude: 0.5 V
Lead Channel Setting Pacing Pulse Width: 0.4 ms
MDC IDC LEAD LOCATION: 753860
MDC IDC MSMT BATTERY REMAINING LONGEVITY: 64 mo
MDC IDC MSMT BATTERY VOLTAGE: 2.76 V
MDC IDC MSMT LEADCHNL RV PACING THRESHOLD PULSEWIDTH: 0.4 ms
MDC IDC PG IMPLANT DT: 20160425
MDC IDC SESS DTM: 20191021172157
MDC IDC SET LEADCHNL RV PACING AMPLITUDE: 2.25 V
MDC IDC SET LEADCHNL RV SENSING SENSITIVITY: 4 mV

## 2018-03-17 DIAGNOSIS — Z9849 Cataract extraction status, unspecified eye: Secondary | ICD-10-CM | POA: Diagnosis not present

## 2018-03-17 DIAGNOSIS — Z961 Presence of intraocular lens: Secondary | ICD-10-CM | POA: Diagnosis not present

## 2018-03-17 DIAGNOSIS — H43813 Vitreous degeneration, bilateral: Secondary | ICD-10-CM | POA: Diagnosis not present

## 2018-03-17 DIAGNOSIS — H3530 Unspecified macular degeneration: Secondary | ICD-10-CM | POA: Diagnosis not present

## 2018-03-18 ENCOUNTER — Other Ambulatory Visit: Payer: Self-pay | Admitting: Cardiology

## 2018-03-29 ENCOUNTER — Other Ambulatory Visit: Payer: Self-pay | Admitting: Cardiology

## 2018-04-04 ENCOUNTER — Other Ambulatory Visit: Payer: Self-pay | Admitting: Cardiology

## 2018-04-11 DIAGNOSIS — R42 Dizziness and giddiness: Secondary | ICD-10-CM | POA: Diagnosis not present

## 2018-04-24 ENCOUNTER — Other Ambulatory Visit: Payer: Self-pay | Admitting: Cardiology

## 2018-05-01 ENCOUNTER — Other Ambulatory Visit: Payer: Self-pay | Admitting: Cardiology

## 2018-05-06 ENCOUNTER — Other Ambulatory Visit: Payer: Self-pay

## 2018-05-06 ENCOUNTER — Telehealth: Payer: Self-pay

## 2018-05-06 MED ORDER — APIXABAN 2.5 MG PO TABS: 2.5000 mg | ORAL_TABLET | Freq: Two times a day (BID) | ORAL | 3 refills | Status: DC

## 2018-05-06 NOTE — Telephone Encounter (Signed)
**Note De-Identified Tennille Montelongo Obfuscation** The pt brought the provider part of a BMS pt asst application to the office with request to have the form completed and to be signed by Dr Marlou Porch along with a RX for Eliquis 2.5 mg #180 with 3 refills.  I have completed the form and printed the Eliquis RX and Dr Marlou Porch has signed both.  I contacted the pt and made him aware that the provider part of his BMS pt asst application and a RX for his Eliquis are both signed and ready to be picked up. He is requesting that I mail it to him as he has transportation issues.  I have placed all in an envelope addressed to the pt and placed it in our out going mail bin. He is aware that our mail runs slow and to allow extra time for it to arrive in the mail.

## 2018-05-19 DIAGNOSIS — M109 Gout, unspecified: Secondary | ICD-10-CM | POA: Diagnosis not present

## 2018-05-19 DIAGNOSIS — I10 Essential (primary) hypertension: Secondary | ICD-10-CM | POA: Diagnosis not present

## 2018-05-19 DIAGNOSIS — L989 Disorder of the skin and subcutaneous tissue, unspecified: Secondary | ICD-10-CM | POA: Diagnosis not present

## 2018-05-19 DIAGNOSIS — E7800 Pure hypercholesterolemia, unspecified: Secondary | ICD-10-CM | POA: Diagnosis not present

## 2018-05-19 DIAGNOSIS — I480 Paroxysmal atrial fibrillation: Secondary | ICD-10-CM | POA: Diagnosis not present

## 2018-05-23 ENCOUNTER — Ambulatory Visit (INDEPENDENT_AMBULATORY_CARE_PROVIDER_SITE_OTHER): Payer: Medicare Other

## 2018-05-23 DIAGNOSIS — I442 Atrioventricular block, complete: Secondary | ICD-10-CM

## 2018-05-24 LAB — CUP PACEART REMOTE DEVICE CHECK
Battery Remaining Longevity: 60 mo
Battery Voltage: 2.76 V
Brady Statistic RV Percent Paced: 99 %
Date Time Interrogation Session: 20200120152027
Implantable Lead Implant Date: 20160425
Implantable Lead Model: 5076
Implantable Pulse Generator Implant Date: 20160425
Lead Channel Pacing Threshold Amplitude: 0.5 V
Lead Channel Setting Pacing Pulse Width: 0.4 ms
MDC IDC LEAD LOCATION: 753860
MDC IDC MSMT BATTERY IMPEDANCE: 992 Ohm
MDC IDC MSMT LEADCHNL RA IMPEDANCE VALUE: 0 Ohm
MDC IDC MSMT LEADCHNL RV IMPEDANCE VALUE: 587 Ohm
MDC IDC MSMT LEADCHNL RV PACING THRESHOLD PULSEWIDTH: 0.4 ms
MDC IDC SET LEADCHNL RV PACING AMPLITUDE: 2.25 V
MDC IDC SET LEADCHNL RV SENSING SENSITIVITY: 4 mV

## 2018-05-24 NOTE — Progress Notes (Signed)
Remote pacemaker transmission.   

## 2018-05-30 DIAGNOSIS — X32XXXA Exposure to sunlight, initial encounter: Secondary | ICD-10-CM | POA: Diagnosis not present

## 2018-05-30 DIAGNOSIS — L570 Actinic keratosis: Secondary | ICD-10-CM | POA: Diagnosis not present

## 2018-05-30 DIAGNOSIS — L820 Inflamed seborrheic keratosis: Secondary | ICD-10-CM | POA: Diagnosis not present

## 2018-07-15 NOTE — Telephone Encounter (Signed)
**Note De-Identified Jesus Beck Obfuscation** Letter received from BMS stating that they have denied the pt assistance with his Eliquis. Reason: Product covered by insurance.  The letter states that they have notified the pt of this denial.

## 2018-07-20 NOTE — Telephone Encounter (Signed)
**Note De-Identified Cincere Deprey Obfuscation** We received another letter from [facility] stating that they have approved the pt for pt asst with Eliquis. Approval good from 07/19/2018 until 05/04/2019.

## 2018-08-22 ENCOUNTER — Encounter: Payer: Medicare Other | Admitting: *Deleted

## 2018-08-22 ENCOUNTER — Other Ambulatory Visit: Payer: Self-pay

## 2018-08-23 DIAGNOSIS — M10042 Idiopathic gout, left hand: Secondary | ICD-10-CM | POA: Diagnosis not present

## 2018-08-31 ENCOUNTER — Encounter: Payer: Self-pay | Admitting: Cardiology

## 2018-09-09 ENCOUNTER — Telehealth: Payer: Self-pay | Admitting: Cardiology

## 2018-09-09 NOTE — Telephone Encounter (Signed)
Pt daughter called and left voice massage requesting a call back to pt number.   Called pt back and LMOVM.

## 2018-09-13 NOTE — Telephone Encounter (Signed)
Spoke w/ pt and he informed me that he gets error code 3230 half through transmission. Instructed pt to call tech support for further help trouble shooting the monitor. Pt verbalized understanding.

## 2018-09-14 NOTE — Telephone Encounter (Signed)
New monitor ordered 09/13/2018

## 2018-09-18 ENCOUNTER — Other Ambulatory Visit: Payer: Self-pay | Admitting: Cardiology

## 2018-09-23 ENCOUNTER — Ambulatory Visit (INDEPENDENT_AMBULATORY_CARE_PROVIDER_SITE_OTHER): Payer: Medicare Other | Admitting: *Deleted

## 2018-09-23 DIAGNOSIS — I442 Atrioventricular block, complete: Secondary | ICD-10-CM | POA: Diagnosis not present

## 2018-09-24 LAB — CUP PACEART REMOTE DEVICE CHECK
Battery Impedance: 1260 Ohm
Battery Remaining Longevity: 52 mo
Battery Voltage: 2.76 V
Brady Statistic RV Percent Paced: 99 %
Date Time Interrogation Session: 20200522174814
Implantable Lead Implant Date: 20160425
Implantable Lead Location: 753860
Implantable Lead Model: 5076
Implantable Pulse Generator Implant Date: 20160425
Lead Channel Impedance Value: 0 Ohm
Lead Channel Impedance Value: 533 Ohm
Lead Channel Pacing Threshold Amplitude: 0.5 V
Lead Channel Pacing Threshold Pulse Width: 0.4 ms
Lead Channel Setting Pacing Amplitude: 2.25 V
Lead Channel Setting Pacing Pulse Width: 0.4 ms
Lead Channel Setting Sensing Sensitivity: 2.8 mV

## 2018-10-04 ENCOUNTER — Encounter: Payer: Self-pay | Admitting: Cardiology

## 2018-10-04 NOTE — Progress Notes (Signed)
Remote pacemaker transmission.   

## 2018-11-09 DIAGNOSIS — L602 Onychogryphosis: Secondary | ICD-10-CM | POA: Diagnosis not present

## 2018-11-09 DIAGNOSIS — M2041 Other hammer toe(s) (acquired), right foot: Secondary | ICD-10-CM | POA: Diagnosis not present

## 2018-11-09 DIAGNOSIS — M2042 Other hammer toe(s) (acquired), left foot: Secondary | ICD-10-CM | POA: Diagnosis not present

## 2018-11-18 DIAGNOSIS — Z950 Presence of cardiac pacemaker: Secondary | ICD-10-CM | POA: Diagnosis not present

## 2018-11-18 DIAGNOSIS — M109 Gout, unspecified: Secondary | ICD-10-CM | POA: Diagnosis not present

## 2018-11-18 DIAGNOSIS — G8929 Other chronic pain: Secondary | ICD-10-CM | POA: Diagnosis not present

## 2018-11-18 DIAGNOSIS — I480 Paroxysmal atrial fibrillation: Secondary | ICD-10-CM | POA: Diagnosis not present

## 2018-11-18 DIAGNOSIS — Z1389 Encounter for screening for other disorder: Secondary | ICD-10-CM | POA: Diagnosis not present

## 2018-11-18 DIAGNOSIS — N183 Chronic kidney disease, stage 3 (moderate): Secondary | ICD-10-CM | POA: Diagnosis not present

## 2018-11-18 DIAGNOSIS — E7800 Pure hypercholesterolemia, unspecified: Secondary | ICD-10-CM | POA: Diagnosis not present

## 2018-11-18 DIAGNOSIS — I10 Essential (primary) hypertension: Secondary | ICD-10-CM | POA: Diagnosis not present

## 2018-11-18 DIAGNOSIS — Z0000 Encounter for general adult medical examination without abnormal findings: Secondary | ICD-10-CM | POA: Diagnosis not present

## 2018-12-21 ENCOUNTER — Other Ambulatory Visit: Payer: Self-pay | Admitting: Cardiology

## 2018-12-22 ENCOUNTER — Other Ambulatory Visit: Payer: Self-pay | Admitting: Cardiology

## 2018-12-23 ENCOUNTER — Encounter: Payer: Medicare Other | Admitting: *Deleted

## 2018-12-24 ENCOUNTER — Other Ambulatory Visit: Payer: Self-pay | Admitting: Cardiology

## 2018-12-30 ENCOUNTER — Encounter: Payer: Self-pay | Admitting: Cardiology

## 2019-01-06 ENCOUNTER — Ambulatory Visit (INDEPENDENT_AMBULATORY_CARE_PROVIDER_SITE_OTHER): Payer: Medicare Other | Admitting: *Deleted

## 2019-01-06 DIAGNOSIS — I442 Atrioventricular block, complete: Secondary | ICD-10-CM

## 2019-01-10 LAB — CUP PACEART REMOTE DEVICE CHECK
Battery Impedance: 1368 Ohm
Battery Remaining Longevity: 49 mo
Battery Voltage: 2.75 V
Brady Statistic RV Percent Paced: 99 %
Date Time Interrogation Session: 20200908000126
Implantable Lead Implant Date: 20160425
Implantable Lead Location: 753860
Implantable Lead Model: 5076
Implantable Pulse Generator Implant Date: 20160425
Lead Channel Impedance Value: 0 Ohm
Lead Channel Impedance Value: 519 Ohm
Lead Channel Pacing Threshold Amplitude: 0.5 V
Lead Channel Pacing Threshold Pulse Width: 0.4 ms
Lead Channel Setting Pacing Amplitude: 2.25 V
Lead Channel Setting Pacing Pulse Width: 0.4 ms
Lead Channel Setting Sensing Sensitivity: 2.8 mV

## 2019-01-18 ENCOUNTER — Encounter: Payer: Self-pay | Admitting: Cardiology

## 2019-01-18 ENCOUNTER — Other Ambulatory Visit: Payer: Self-pay

## 2019-01-18 ENCOUNTER — Encounter (INDEPENDENT_AMBULATORY_CARE_PROVIDER_SITE_OTHER): Payer: Self-pay

## 2019-01-18 ENCOUNTER — Encounter: Payer: Self-pay | Admitting: Internal Medicine

## 2019-01-18 ENCOUNTER — Ambulatory Visit (INDEPENDENT_AMBULATORY_CARE_PROVIDER_SITE_OTHER): Payer: Medicare Other | Admitting: Internal Medicine

## 2019-01-18 VITALS — BP 122/66 | HR 67 | Ht 71.0 in | Wt 155.0 lb

## 2019-01-18 DIAGNOSIS — I442 Atrioventricular block, complete: Secondary | ICD-10-CM | POA: Diagnosis not present

## 2019-01-18 DIAGNOSIS — I10 Essential (primary) hypertension: Secondary | ICD-10-CM

## 2019-01-18 DIAGNOSIS — Z950 Presence of cardiac pacemaker: Secondary | ICD-10-CM

## 2019-01-18 DIAGNOSIS — I480 Paroxysmal atrial fibrillation: Secondary | ICD-10-CM

## 2019-01-18 DIAGNOSIS — I25119 Atherosclerotic heart disease of native coronary artery with unspecified angina pectoris: Secondary | ICD-10-CM

## 2019-01-18 NOTE — Patient Instructions (Signed)
Medication Instructions:  Your physician recommends that you continue on your current medications as directed. Please refer to the Current Medication list given to you today.  Labwork: None ordered.  Testing/Procedures: None ordered.  Follow-Up: Your physician wants you to follow-up in: one year with Dr. Lovena Le.   You will receive a reminder letter in the mail two months in advance. If you don't receive a letter, please call our office to schedule the follow-up appointment.  Remote monitoring is used to monitor your Pacemaker from home. This monitoring reduces the number of office visits required to check your device to one time per year. It allows [place] to keep an eye on the functioning of your device to ensure it is working properly. You are scheduled for a device check from home on 04/19/2019. You may send your transmission at any time that day. If you have a wireless device, the transmission will be sent automatically. After your physician reviews your transmission, you will receive a postcard with your next transmission date.  Any Other Special Instructions Will Be Listed Below (If Applicable).  If you need a refill on your cardiac medications before your next appointment, please call your pharmacy.

## 2019-01-18 NOTE — Progress Notes (Signed)
HPI [name] returns today for followup. He is a pleasant 85yo man with a h/o atrial fibrillation and complete heart block, s/p PPM insertion, also with CAD, s/p CABG. In the interim he has been bothered by some arthritis. No chest pain or sob. He is using a walking stick to keep from falling when he starts walking but then does not need it. He continues to lose weight.  Allergies  Allergen Reactions  . Lipitor [Atorvastatin] Hives     Current Outpatient Medications  Medication Sig Dispense Refill  . amLODipine (NORVASC) 5 MG tablet TAKE 1 TABLET BY MOUTH DAILY 30 tablet 8  . apixaban (ELIQUIS) 2.5 MG TABS tablet Take 1 tablet (2.5 mg total) by mouth 2 (two) times daily. 180 tablet 3  . furosemide (LASIX) 20 MG tablet Take 20 mg by mouth daily.     . isosorbide mononitrate (IMDUR) 60 MG 24 hr tablet TAKE 1 AND 1/2 TABLETS BY MOUTH DAILY 135 tablet 2  . losartan (COZAAR) 100 MG tablet TAKE 1 TABLET(100 MG) BY MOUTH DAILY 30 tablet 0  . nitroGLYCERIN (NITROSTAT) 0.4 MG SL tablet Place 1 tablet (0.4 mg total) under the tongue every 5 (five) minutes as needed for chest pain. 25 tablet 3  . Omega-3 Fatty Acids (OMEGA 3 PO) Take 2,000 Units by mouth 2 (two) times daily.     . simvastatin (ZOCOR) 20 MG tablet TAKE 1 TABLET BY MOUTH EVERY NIGHT AT BEDTIME 90 tablet 0  . traMADol (ULTRAM) 50 MG tablet Take 50 mg by mouth daily as needed (pain).     No current facility-administered medications for this visit.      Past Medical History:  Diagnosis Date  . Atrial fibrillation (HCC)    paroxysmal atrial fibrillation- stopped sotolol due to HR 44 (on 40mg  BID)  . Atrial fibrillation ([place])   . Back pain   . CAD (coronary artery disease)    s/p CABG 1999 with LIMA to MAD, SVG to first diagonal, SVG to RCA and PCI with DES mid LAD 06/09 and residual 70% D2 not amenable to PCI due to small caliber Cardiology Dr. Marlou Porch  . Carotid artery stenosis     s/p bialteral CEA's 2004 with persistent  bruit on left and minimal plaque by carotid dopplers 2010  . Dyslipidemia   . HTN (hypertension)   . Hypercholesteremia   . Hypersomnia   . RBBB   . Renal insufficiency     ROS:   All systems reviewed and negative except as noted in the HPI.   Past Surgical History:  Procedure Laterality Date  . CAROTID ENDARTERECTOMY Bilateral   . CHOLECYSTECTOMY    . CORONARY ARTERY BYPASS GRAFT    . EP IMPLANTABLE DEVICE N/A 08/27/2014   Procedure: PACEMAKER IMPLANT;  Surgeon: Belva Crome, MD;  Location: [facility] CATH LAB;  Service: Cardiovascular;  Laterality: N/A;  . HERNIA REPAIR    . INTRAOCULAR LENS INSERTION    . PERMANENT PACEMAKER INSERTION N/A 08/27/2014   Procedure: Daneil Dan;  Surgeon: Belva Crome, MD;  Location: [facility] CATH LAB;  Service: Cardiovascular;  Laterality: N/A;  . PERMANENT PACEMAKER INSERTION N/A 08/27/2014   Procedure: PERMANENT PACEMAKER INSERTION;  Surgeon: Evans Lance, MD;  Location: [facility] CATH LAB;  Service: Cardiovascular;  Laterality: N/A;     Family History  Problem Relation Age of Onset  . Diabetes Mother   . Heart disease Father      Social History  Socioeconomic History  . Marital status: Widowed    Spouse name: Not on file  . Number of children: Not on file  . Years of education: Not on file  . Highest education level: Not on file  Occupational History  . Not on file  Social Needs  . Financial resource strain: Not on file  . Food insecurity    Worry: Not on file    Inability: Not on file  . Transportation needs    Medical: Not on file    Non-medical: Not on file  Tobacco Use  . Smoking status: Never Smoker  . Smokeless tobacco: Never Used  Substance and Sexual Activity  . Alcohol use: No  . Drug use: No  . Sexual activity: Not on file  Lifestyle  . Physical activity    Days per week: Not on file    Minutes per session: Not on file  . Stress: Not on file  Relationships  . Social Herbalist on phone: Not on file    Gets  together: Not on file    Attends religious service: Not on file    Active member of club or organization: Not on file    Attends meetings of clubs or organizations: Not on file    Relationship status: Not on file  . Intimate partner violence    Fear of current or ex partner: Not on file    Emotionally abused: Not on file    Physically abused: Not on file    Forced sexual activity: Not on file  Other Topics Concern  . Not on file  Social History Narrative  . Not on file     BP 122/66   Pulse 67   Ht 5\' 11"  (1.803 m)   Wt 155 lb (70.3 kg)   SpO2 98%   BMI 21.62 kg/m   Physical Exam:  Elderly appearing 85 yo man,  NAD HEENT: Unremarkable Neck:  No JVD, no thyromegally Lymphatics:  No adenopathy Back:  No CVA tenderness Lungs:  Clear with no wheezes HEART:  Regular rate rhythm, no murmurs, no rubs, no clicks Abd:  soft, positive bowel sounds, no organomegally, no rebound, no guarding Ext:  2 plus pulses, no edema, no cyanosis, no clubbing Skin:  No rashes no nodules Neuro:  CN II through XII intact, motor grossly intact  EKG - atrial fib with ventricular pacing and PVC's  DEVICE  Normal device function.  See PaceArt for details.   Assess/Plan: 1. CHB - he is asymptomatic, s/p PPM Insertion. 2. PPM - his medtronic VVI PM is working normally.  3. Atrial fib - his rates are controlled. He is tolerating systemic anti-coagulation. 4. CAD - he denies anginal symptoms. I encouraged him to increase his physical activity.  [name]

## 2019-01-18 NOTE — Progress Notes (Signed)
Remote pacemaker transmission.   

## 2019-01-24 ENCOUNTER — Other Ambulatory Visit: Payer: Self-pay | Admitting: Cardiology

## 2019-01-24 DIAGNOSIS — E7800 Pure hypercholesterolemia, unspecified: Secondary | ICD-10-CM | POA: Diagnosis not present

## 2019-01-24 DIAGNOSIS — R17 Unspecified jaundice: Secondary | ICD-10-CM | POA: Diagnosis not present

## 2019-01-24 DIAGNOSIS — N183 Chronic kidney disease, stage 3 (moderate): Secondary | ICD-10-CM | POA: Diagnosis not present

## 2019-01-24 DIAGNOSIS — I480 Paroxysmal atrial fibrillation: Secondary | ICD-10-CM | POA: Diagnosis not present

## 2019-01-24 DIAGNOSIS — I10 Essential (primary) hypertension: Secondary | ICD-10-CM | POA: Diagnosis not present

## 2019-02-01 DIAGNOSIS — Z23 Encounter for immunization: Secondary | ICD-10-CM | POA: Diagnosis not present

## 2019-02-13 ENCOUNTER — Other Ambulatory Visit: Payer: Self-pay | Admitting: Cardiology

## 2019-02-14 ENCOUNTER — Other Ambulatory Visit: Payer: Self-pay | Admitting: Cardiology

## 2019-03-12 ENCOUNTER — Other Ambulatory Visit: Payer: Self-pay | Admitting: Cardiology

## 2019-03-13 ENCOUNTER — Other Ambulatory Visit: Payer: Self-pay | Admitting: Cardiology

## 2019-03-20 ENCOUNTER — Other Ambulatory Visit: Payer: Self-pay | Admitting: Cardiology

## 2019-03-21 ENCOUNTER — Telehealth: Payer: Self-pay | Admitting: Cardiology

## 2019-03-21 MED ORDER — LOSARTAN POTASSIUM 100 MG PO TABS: 100.0000 mg | ORAL_TABLET | Freq: Every day | ORAL | 0 refills | Status: DC

## 2019-03-21 NOTE — Telephone Encounter (Signed)
°*  STAT* If patient is at the pharmacy, call can be transferred to refill team.   1. Which medications need to be refilled? (please list name of each medication and dose if known) losartan (COZAAR) 100 MG tablet  2. Which pharmacy/location (including street and city if local pharmacy) is medication to be sent to? [place] [place] - [address] AT [place]  3. Do they need a 30 day or 90 day supply? [place]

## 2019-03-21 NOTE — Telephone Encounter (Signed)
Pt's medication was sent to pt's pharmacy as requested. Confirmation received.  °

## 2019-03-27 ENCOUNTER — Other Ambulatory Visit: Payer: Self-pay | Admitting: *Deleted

## 2019-03-27 MED ORDER — APIXABAN 2.5 MG PO TABS: 2.5000 mg | ORAL_TABLET | Freq: Two times a day (BID) | ORAL | 3 refills | Status: DC

## 2019-03-29 ENCOUNTER — Telehealth: Payer: Self-pay | Admitting: *Deleted

## 2019-03-29 NOTE — Telephone Encounter (Signed)
Received a request from pt's daughter Caren Griffins to complete [facility] pt assistance form and mail back to pt's home address.  Application and RX completed and mailed to pt as requested.  Copies made and given to medical records to be scan into system.

## 2019-05-09 ENCOUNTER — Ambulatory Visit (INDEPENDENT_AMBULATORY_CARE_PROVIDER_SITE_OTHER): Payer: Medicare Other | Admitting: Cardiology

## 2019-05-09 ENCOUNTER — Other Ambulatory Visit: Payer: Self-pay

## 2019-05-09 ENCOUNTER — Encounter: Payer: Self-pay | Admitting: Cardiology

## 2019-05-09 VITALS — BP 136/64 | HR 62 | Ht 71.0 in | Wt 148.0 lb

## 2019-05-09 DIAGNOSIS — I482 Chronic atrial fibrillation, unspecified: Secondary | ICD-10-CM

## 2019-05-09 DIAGNOSIS — I442 Atrioventricular block, complete: Secondary | ICD-10-CM

## 2019-05-09 DIAGNOSIS — I25119 Atherosclerotic heart disease of native coronary artery with unspecified angina pectoris: Secondary | ICD-10-CM

## 2019-05-09 DIAGNOSIS — Z950 Presence of cardiac pacemaker: Secondary | ICD-10-CM | POA: Diagnosis not present

## 2019-05-09 NOTE — Progress Notes (Signed)
Cardiology Office Note:    Date:  05/09/2019   ID:  Jesus Beck, DOB 1925/05/06, MRN [id number]  PCP:  Seward Carol, MD  Cardiologist:  Candee Furbish, MD  Electrophysiologist:  None   Referring MD: Seward Carol, MD     History of Present Illness:    Jesus Beck is a 86 y.o. male with complete heart block status post pacemaker, Dr. Lovena Le here for follow-up.  EKG today shows ventricular pacing underlying atrial fibrillation.  Currently on Eliquis 2.5 mg twice a day.  Also has coronary disease status post CABG.  Doing quite well careful about falling, using cane, walking stick.  Main complaint is his walking.  Overall he is not having any chest pain, shortness of breath.  Past Medical History:  Diagnosis Date  . Atrial fibrillation (HCC)    paroxysmal atrial fibrillation- stopped sotolol due to HR 44 (on 40mg  BID)  . Atrial fibrillation ([place])   . Back pain   . CAD (coronary artery disease)    s/p CABG 1999 with LIMA to MAD, SVG to first diagonal, SVG to RCA and PCI with DES mid LAD 06/09 and residual 70% D2 not amenable to PCI due to small caliber Cardiology Dr. Marlou Porch  . Carotid artery stenosis     s/p bialteral CEA's 2004 with persistent bruit on left and minimal plaque by carotid dopplers 2010  . Dyslipidemia   . HTN (hypertension)   . Hypercholesteremia   . Hypersomnia   . RBBB   . Renal insufficiency     Past Surgical History:  Procedure Laterality Date  . CAROTID ENDARTERECTOMY Bilateral   . CHOLECYSTECTOMY    . CORONARY ARTERY BYPASS GRAFT    . EP IMPLANTABLE DEVICE N/A 08/27/2014   Procedure: PACEMAKER IMPLANT;  Surgeon: Belva Crome, MD;  Location: [facility] CATH LAB;  Service: Cardiovascular;  Laterality: N/A;  . HERNIA REPAIR    . INTRAOCULAR LENS INSERTION    . PERMANENT PACEMAKER INSERTION N/A 08/27/2014   Procedure: Daneil Dan;  Surgeon: Belva Crome, MD;  Location: [facility] CATH LAB;  Service: Cardiovascular;  Laterality: N/A;  . PERMANENT PACEMAKER INSERTION N/A  08/27/2014   Procedure: PERMANENT PACEMAKER INSERTION;  Surgeon: Evans Lance, MD;  Location: [facility] CATH LAB;  Service: Cardiovascular;  Laterality: N/A;    Current Medications: Current Meds  Medication Sig  . amLODipine (NORVASC) 5 MG tablet TAKE 1 TABLET BY MOUTH DAILY  . apixaban (ELIQUIS) 2.5 MG TABS tablet Take 1 tablet (2.5 mg total) by mouth 2 (two) times daily.  . furosemide (LASIX) 20 MG tablet Take 20 mg by mouth daily.   . isosorbide mononitrate (IMDUR) 60 MG 24 hr tablet TAKE 1 AND 1/2 TABLETS BY MOUTH DAILY  . losartan (COZAAR) 100 MG tablet Take 1 tablet (100 mg total) by mouth daily. Please keep upcoming appt with Dr. Marlou Porch before anymore refills Final attempt  . nitroGLYCERIN (NITROSTAT) 0.4 MG SL tablet Place 1 tablet (0.4 mg total) under the tongue every 5 (five) minutes as needed for chest pain.  . Omega-3 Fatty Acids (OMEGA 3 PO) Take 2,000 Units by mouth 2 (two) times daily.   . simvastatin (ZOCOR) 20 MG tablet TAKE 1 TABLET BY MOUTH EVERY NIGHT AT BEDTIME  . traMADol (ULTRAM) 50 MG tablet Take 50 mg by mouth daily as needed (pain).     Allergies:   Lipitor [atorvastatin]   Social History   Socioeconomic History  . Marital status: Widowed    Spouse name:  Not on file  . Number of children: Not on file  . Years of education: Not on file  . Highest education level: Not on file  Occupational History  . Not on file  Tobacco Use  . Smoking status: Never Smoker  . Smokeless tobacco: Never Used  Substance and Sexual Activity  . Alcohol use: No  . Drug use: No  . Sexual activity: Not on file  Other Topics Concern  . Not on file  Social History Narrative  . Not on file   Social Determinants of Health   Financial Resource Strain:   . Difficulty of Paying Living Expenses: Not on file  Food Insecurity:   . Worried About Charity fundraiser in the Last Year: Not on file  . Ran Out of Food in the Last Year: Not on file  Transportation Needs:   . Lack of  Transportation (Medical): Not on file  . Lack of Transportation (Non-Medical): Not on file  Physical Activity:   . Days of Exercise per Week: Not on file  . Minutes of Exercise per Session: Not on file  Stress:   . Feeling of Stress : Not on file  Social Connections:   . Frequency of Communication with Friends and Family: Not on file  . Frequency of Social Gatherings with Friends and Family: Not on file  . Attends Religious Services: Not on file  . Active Member of Clubs or Organizations: Not on file  . Attends Archivist Meetings: Not on file  . Marital Status: Not on file     Family History: The patient's family history includes Diabetes in his mother; Heart disease in his father.  ROS:   Please see the history of present illness.    No fevers chills nausea vomiting syncope all other systems reviewed and are negative.  EKGs/Labs/Other Studies Reviewed:    The following studies were reviewed today:  Echo 2009-EF 65 to 70% mild aortic calcification  EKG:  EKG is  ordered today.  The ekg ordered today demonstrates atrial fibrillation V pacing 62  Recent Labs: No results found for requested labs within last 8760 hours.  Recent Lipid Panel    Component Value Date/Time   CHOL 134 03/27/2013 1159   TRIG 83.0 03/27/2013 1159   HDL 51.50 03/27/2013 1159   CHOLHDL 3 03/27/2013 1159   VLDL 16.6 03/27/2013 1159   LDLCALC 66 03/27/2013 1159    Physical Exam:    VS:  BP 136/64   Pulse 62   Ht 5\' 11"  (1.803 m)   Wt 148 lb (67.1 kg)   SpO2 92%   BMI 20.64 kg/m     Wt Readings from Last 3 Encounters:  05/09/19 148 lb (67.1 kg)  01/18/19 155 lb (70.3 kg)  11/22/17 153 lb 9.6 oz (69.7 kg)     GEN: Elderly, thin in no acute distress HEENT: Normal NECK: No JVD; Left carotid bruit LYMPHATICS: No lymphadenopathy CARDIAC: RRR, no murmurs, rubs, gallops RESPIRATORY:  Clear to auscultation without rales, wheezing or rhonchi  ABDOMEN: Soft, non-tender,  non-distended MUSCULOSKELETAL:  Mild ankle edema; No deformity  SKIN: Warm and dry NEUROLOGIC:  Alert and oriented x 3 PSYCHIATRIC:  Normal affect   ASSESSMENT:    1. Coronary artery disease involving native coronary artery of native heart with angina pectoris ([place])   2. Pacemaker   3. CHB (complete heart block) (HCC)   4. Chronic atrial fibrillation (HCC)    PLAN:    In order  of problems listed above:  Complete heart block -Pacemaker, Dr. Lovena Le, functioning well.   Persistent atrial fibrillation -Pacemaker ventricular pacing.  Doing well.  Chronic anticoagulation -Eliquis 2.5 twice a day.  No evidence of bleeding.  Outside lab work from 01/24/2019 shows creatinine of 1.6, LDL of 65, hemoglobin of 13.5  Coronary artery disease with angina CABG 1999 -Angina is controlled with isosorbide.  Doing well.  Carotid artery disease with prior endarterectomy with prior vascular ultrasound showing minimal plaque doing well  Hyperlipidemia -Continuing with lower dose simvastatin, LDL 65 from outside records.  No myalgias.  Mild ankle edema -Compression stockings elevation.  Right greater than left.  No change from prior.  47-month follow-up APP, 12 me   Medication Adjustments/Labs and Tests Ordered: Current medicines are reviewed at length with the patient today.  Concerns regarding medicines are outlined above.  Orders Placed This Encounter  Procedures  . EKG 12-Lead   No orders of the defined types were placed in this encounter.   Patient Instructions  Medication Instructions:  The current medical regimen is effective;  continue present plan and medications.  *If you need a refill on your cardiac medications before your next appointment, please call your pharmacy*  Follow-Up: At [facility], you and your health needs are our priority.  As part of our continuing mission to provide you with exceptional heart care, we have created designated Provider Care Teams.  These  Care Teams include your primary Cardiologist (physician) and Advanced Practice Providers (APPs -  Physician Assistants and Nurse Practitioners) who all work together to provide you with the care you need, when you need it.  Your next appointment:   6 month(s)  The format for your next appointment:   In Person  Provider:   Cecilie Kicks, NP and Dr Marlou Porch in 1 year.  Thank you for choosing [facility]!!         Signed, Candee Furbish, MD  05/09/2019 10:44 AM    [place]

## 2019-05-09 NOTE — Patient Instructions (Addendum)
Medication Instructions:  The current medical regimen is effective;  continue present plan and medications.  *If you need a refill on your cardiac medications before your next appointment, please call your pharmacy*  Follow-Up: At [facility], you and your health needs are our priority.  As part of our continuing mission to provide you with exceptional heart care, we have created designated Provider Care Teams.  These Care Teams include your primary Cardiologist (physician) and Advanced Practice Providers (APPs -  Physician Assistants and Nurse Practitioners) who all work together to provide you with the care you need, when you need it.  Your next appointment:   6 month(s)  The format for your next appointment:   In Person  Provider:   Cecilie Kicks, NP and Dr Marlou Porch in 1 year.  Thank you for choosing [facility]!!

## 2019-05-23 DIAGNOSIS — R600 Localized edema: Secondary | ICD-10-CM | POA: Diagnosis not present

## 2019-05-23 DIAGNOSIS — M109 Gout, unspecified: Secondary | ICD-10-CM | POA: Diagnosis not present

## 2019-05-23 DIAGNOSIS — E7800 Pure hypercholesterolemia, unspecified: Secondary | ICD-10-CM | POA: Diagnosis not present

## 2019-05-23 DIAGNOSIS — N1832 Chronic kidney disease, stage 3b: Secondary | ICD-10-CM | POA: Diagnosis not present

## 2019-05-23 DIAGNOSIS — I442 Atrioventricular block, complete: Secondary | ICD-10-CM | POA: Diagnosis not present

## 2019-05-23 DIAGNOSIS — I480 Paroxysmal atrial fibrillation: Secondary | ICD-10-CM | POA: Diagnosis not present

## 2019-05-23 DIAGNOSIS — G8929 Other chronic pain: Secondary | ICD-10-CM | POA: Diagnosis not present

## 2019-05-23 DIAGNOSIS — Z950 Presence of cardiac pacemaker: Secondary | ICD-10-CM | POA: Diagnosis not present

## 2019-05-23 DIAGNOSIS — I10 Essential (primary) hypertension: Secondary | ICD-10-CM | POA: Diagnosis not present

## 2019-06-16 ENCOUNTER — Other Ambulatory Visit: Payer: Self-pay | Admitting: Cardiology

## 2019-07-04 ENCOUNTER — Other Ambulatory Visit: Payer: Self-pay | Admitting: Cardiology

## 2019-07-04 NOTE — Telephone Encounter (Signed)
Eliquis 2.5mg  refill request received, pt is 86 yrs old, weight-67.1kg, Crea-1.54 on 05/23/2019 via [facility] an 05/23/2019, Diagnosis-Afib, and last seen by Dr. Marlou Porch on 05/09/2019. Dose is appropriate based on dosing criteria. Will send in refill to requested pharmacy.

## 2019-08-04 ENCOUNTER — Ambulatory Visit (INDEPENDENT_AMBULATORY_CARE_PROVIDER_SITE_OTHER): Payer: Medicare Other | Admitting: *Deleted

## 2019-08-04 DIAGNOSIS — I442 Atrioventricular block, complete: Secondary | ICD-10-CM

## 2019-08-07 LAB — CUP PACEART REMOTE DEVICE CHECK
Battery Impedance: 1822 Ohm
Battery Remaining Longevity: 39 mo
Battery Voltage: 2.76 V
Brady Statistic RV Percent Paced: 99 %
Date Time Interrogation Session: 20210402171519
Implantable Lead Implant Date: 20160425
Implantable Lead Location: 753860
Implantable Lead Model: 5076
Implantable Pulse Generator Implant Date: 20160425
Lead Channel Impedance Value: 0 Ohm
Lead Channel Impedance Value: 497 Ohm
Lead Channel Pacing Threshold Amplitude: 0.5 V
Lead Channel Pacing Threshold Pulse Width: 0.4 ms
Lead Channel Setting Pacing Amplitude: 2.25 V
Lead Channel Setting Pacing Pulse Width: 0.4 ms
Lead Channel Setting Sensing Sensitivity: 2 mV

## 2019-08-14 ENCOUNTER — Other Ambulatory Visit: Payer: Self-pay | Admitting: Cardiology

## 2019-08-16 DIAGNOSIS — R0902 Hypoxemia: Secondary | ICD-10-CM | POA: Diagnosis not present

## 2019-08-16 DIAGNOSIS — M25562 Pain in left knee: Secondary | ICD-10-CM | POA: Diagnosis not present

## 2019-08-16 DIAGNOSIS — M25519 Pain in unspecified shoulder: Secondary | ICD-10-CM | POA: Diagnosis not present

## 2019-08-16 DIAGNOSIS — R52 Pain, unspecified: Secondary | ICD-10-CM | POA: Diagnosis not present

## 2019-08-17 ENCOUNTER — Emergency Department (HOSPITAL_COMMUNITY): Payer: Medicare Other

## 2019-08-17 ENCOUNTER — Inpatient Hospital Stay (HOSPITAL_COMMUNITY)
Admission: EM | Admit: 2019-08-17 | Discharge: 2019-08-22 | DRG: 553 | Disposition: A | Discharge status: Home Health | Payer: Medicare Other | Attending: Internal Medicine | Admitting: Internal Medicine

## 2019-08-17 ENCOUNTER — Encounter (HOSPITAL_COMMUNITY): Payer: Self-pay

## 2019-08-17 DIAGNOSIS — E785 Hyperlipidemia, unspecified: Secondary | ICD-10-CM | POA: Diagnosis present

## 2019-08-17 DIAGNOSIS — E7800 Pure hypercholesterolemia, unspecified: Secondary | ICD-10-CM | POA: Diagnosis present

## 2019-08-17 DIAGNOSIS — D696 Thrombocytopenia, unspecified: Secondary | ICD-10-CM | POA: Diagnosis present

## 2019-08-17 DIAGNOSIS — I4510 Unspecified right bundle-branch block: Secondary | ICD-10-CM | POA: Diagnosis present

## 2019-08-17 DIAGNOSIS — N1832 Chronic kidney disease, stage 3b: Secondary | ICD-10-CM | POA: Diagnosis present

## 2019-08-17 DIAGNOSIS — Z20822 Contact with and (suspected) exposure to covid-19: Secondary | ICD-10-CM | POA: Diagnosis present

## 2019-08-17 DIAGNOSIS — I2510 Atherosclerotic heart disease of native coronary artery without angina pectoris: Secondary | ICD-10-CM | POA: Diagnosis present

## 2019-08-17 DIAGNOSIS — M19011 Primary osteoarthritis, right shoulder: Secondary | ICD-10-CM | POA: Diagnosis present

## 2019-08-17 DIAGNOSIS — Z951 Presence of aortocoronary bypass graft: Secondary | ICD-10-CM | POA: Diagnosis not present

## 2019-08-17 DIAGNOSIS — J189 Pneumonia, unspecified organism: Secondary | ICD-10-CM | POA: Diagnosis present

## 2019-08-17 DIAGNOSIS — M25511 Pain in right shoulder: Secondary | ICD-10-CM | POA: Diagnosis not present

## 2019-08-17 DIAGNOSIS — R0689 Other abnormalities of breathing: Secondary | ICD-10-CM | POA: Diagnosis not present

## 2019-08-17 DIAGNOSIS — I129 Hypertensive chronic kidney disease with stage 1 through stage 4 chronic kidney disease, or unspecified chronic kidney disease: Secondary | ICD-10-CM | POA: Diagnosis not present

## 2019-08-17 DIAGNOSIS — J9601 Acute respiratory failure with hypoxia: Secondary | ICD-10-CM | POA: Diagnosis not present

## 2019-08-17 DIAGNOSIS — N189 Chronic kidney disease, unspecified: Secondary | ICD-10-CM | POA: Diagnosis not present

## 2019-08-17 DIAGNOSIS — M25462 Effusion, left knee: Secondary | ICD-10-CM

## 2019-08-17 DIAGNOSIS — Z888 Allergy status to other drugs, medicaments and biological substances status: Secondary | ICD-10-CM

## 2019-08-17 DIAGNOSIS — I10 Essential (primary) hypertension: Secondary | ICD-10-CM | POA: Diagnosis not present

## 2019-08-17 DIAGNOSIS — R918 Other nonspecific abnormal finding of lung field: Secondary | ICD-10-CM | POA: Diagnosis not present

## 2019-08-17 DIAGNOSIS — Z950 Presence of cardiac pacemaker: Secondary | ICD-10-CM | POA: Diagnosis not present

## 2019-08-17 DIAGNOSIS — M2550 Pain in unspecified joint: Secondary | ICD-10-CM | POA: Diagnosis present

## 2019-08-17 DIAGNOSIS — R0602 Shortness of breath: Secondary | ICD-10-CM

## 2019-08-17 DIAGNOSIS — K0889 Other specified disorders of teeth and supporting structures: Secondary | ICD-10-CM | POA: Diagnosis present

## 2019-08-17 DIAGNOSIS — Z7901 Long term (current) use of anticoagulants: Secondary | ICD-10-CM | POA: Diagnosis not present

## 2019-08-17 DIAGNOSIS — I442 Atrioventricular block, complete: Secondary | ICD-10-CM | POA: Diagnosis present

## 2019-08-17 DIAGNOSIS — G9341 Metabolic encephalopathy: Secondary | ICD-10-CM | POA: Diagnosis present

## 2019-08-17 DIAGNOSIS — J9811 Atelectasis: Secondary | ICD-10-CM | POA: Diagnosis not present

## 2019-08-17 DIAGNOSIS — Z8249 Family history of ischemic heart disease and other diseases of the circulatory system: Secondary | ICD-10-CM | POA: Diagnosis not present

## 2019-08-17 DIAGNOSIS — R0902 Hypoxemia: Secondary | ICD-10-CM | POA: Diagnosis not present

## 2019-08-17 DIAGNOSIS — N179 Acute kidney failure, unspecified: Secondary | ICD-10-CM

## 2019-08-17 DIAGNOSIS — I509 Heart failure, unspecified: Secondary | ICD-10-CM | POA: Diagnosis present

## 2019-08-17 DIAGNOSIS — I480 Paroxysmal atrial fibrillation: Secondary | ICD-10-CM | POA: Diagnosis not present

## 2019-08-17 DIAGNOSIS — R4182 Altered mental status, unspecified: Secondary | ICD-10-CM | POA: Diagnosis present

## 2019-08-17 DIAGNOSIS — Z7401 Bed confinement status: Secondary | ICD-10-CM | POA: Diagnosis not present

## 2019-08-17 DIAGNOSIS — M10462 Other secondary gout, left knee: Secondary | ICD-10-CM | POA: Diagnosis present

## 2019-08-17 DIAGNOSIS — I4891 Unspecified atrial fibrillation: Secondary | ICD-10-CM | POA: Diagnosis present

## 2019-08-17 DIAGNOSIS — R509 Fever, unspecified: Principal | ICD-10-CM

## 2019-08-17 DIAGNOSIS — M25562 Pain in left knee: Secondary | ICD-10-CM | POA: Diagnosis not present

## 2019-08-17 DIAGNOSIS — Z8739 Personal history of other diseases of the musculoskeletal system and connective tissue: Secondary | ICD-10-CM

## 2019-08-17 DIAGNOSIS — M109 Gout, unspecified: Secondary | ICD-10-CM | POA: Diagnosis not present

## 2019-08-17 DIAGNOSIS — Z833 Family history of diabetes mellitus: Secondary | ICD-10-CM

## 2019-08-17 DIAGNOSIS — I4819 Other persistent atrial fibrillation: Secondary | ICD-10-CM | POA: Diagnosis present

## 2019-08-17 DIAGNOSIS — I25119 Atherosclerotic heart disease of native coronary artery with unspecified angina pectoris: Secondary | ICD-10-CM | POA: Diagnosis not present

## 2019-08-17 DIAGNOSIS — I130 Hypertensive heart and chronic kidney disease with heart failure and stage 1 through stage 4 chronic kidney disease, or unspecified chronic kidney disease: Secondary | ICD-10-CM | POA: Diagnosis present

## 2019-08-17 LAB — SYNOVIAL CELL COUNT + DIFF, W/ CRYSTALS
Eosinophils-Synovial: 0 % (ref 0–1)
Lymphocytes-Synovial Fld: 0 % (ref 0–20)
Monocyte-Macrophage-Synovial Fluid: 8 % — ABNORMAL LOW (ref 50–90)
Neutrophil, Synovial: 92 % — ABNORMAL HIGH (ref 0–25)
WBC, Synovial: 13150 /mm3 — ABNORMAL HIGH (ref 0–200)

## 2019-08-17 LAB — CBC WITH DIFFERENTIAL/PLATELET
Abs Immature Granulocytes: 0.06 10*3/uL (ref 0.00–0.07)
Basophils Absolute: 0 10*3/uL (ref 0.0–0.1)
Basophils Relative: 0 %
Eosinophils Absolute: 0 10*3/uL (ref 0.0–0.5)
Eosinophils Relative: 0 %
HCT: 41.4 % (ref 39.0–52.0)
Hemoglobin: 13.5 g/dL (ref 13.0–17.0)
Immature Granulocytes: 1 %
Lymphocytes Relative: 7 %
Lymphs Abs: 0.7 10*3/uL (ref 0.7–4.0)
MCH: 31.5 pg (ref 26.0–34.0)
MCHC: 32.6 g/dL (ref 30.0–36.0)
MCV: 96.5 fL (ref 80.0–100.0)
Monocytes Absolute: 1 10*3/uL (ref 0.1–1.0)
Monocytes Relative: 10 %
Neutro Abs: 8 10*3/uL — ABNORMAL HIGH (ref 1.7–7.7)
Neutrophils Relative %: 82 %
Platelets: 99 10*3/uL — ABNORMAL LOW (ref 150–400)
RBC: 4.29 MIL/uL (ref 4.22–5.81)
RDW: 13.5 % (ref 11.5–15.5)
WBC: 9.8 10*3/uL (ref 4.0–10.5)
nRBC: 0 % (ref 0.0–0.2)

## 2019-08-17 LAB — POCT I-STAT 7, (LYTES, BLD GAS, ICA,H+H)
Acid-base deficit: 4 mmol/L — ABNORMAL HIGH (ref 0.0–2.0)
Bicarbonate: 20.3 mmol/L (ref 20.0–28.0)
Calcium, Ion: 1.25 mmol/L (ref 1.15–1.40)
HCT: 36 % — ABNORMAL LOW (ref 39.0–52.0)
Hemoglobin: 12.2 g/dL — ABNORMAL LOW (ref 13.0–17.0)
O2 Saturation: 87 %
Potassium: 3.9 mmol/L (ref 3.5–5.1)
Sodium: 136 mmol/L (ref 135–145)
TCO2: 21 mmol/L — ABNORMAL LOW (ref 22–32)
pCO2 arterial: 32.2 mmHg (ref 32.0–48.0)
pH, Arterial: 7.407 (ref 7.350–7.450)
pO2, Arterial: 52 mmHg — ABNORMAL LOW (ref 83.0–108.0)

## 2019-08-17 LAB — COMPREHENSIVE METABOLIC PANEL
ALT: 21 U/L (ref 0–44)
AST: 32 U/L (ref 15–41)
Albumin: 3.2 g/dL — ABNORMAL LOW (ref 3.5–5.0)
Alkaline Phosphatase: 70 U/L (ref 38–126)
Anion gap: 15 (ref 5–15)
BUN: 39 mg/dL — ABNORMAL HIGH (ref 8–23)
CO2: 22 mmol/L (ref 22–32)
Calcium: 9.4 mg/dL (ref 8.9–10.3)
Chloride: 102 mmol/L (ref 98–111)
Creatinine, Ser: 2 mg/dL — ABNORMAL HIGH (ref 0.61–1.24)
GFR calc Af Amer: 33 mL/min — ABNORMAL LOW (ref >60)
GFR calc non Af Amer: 28 mL/min — ABNORMAL LOW (ref >60)
Glucose, Bld: 109 mg/dL — ABNORMAL HIGH (ref 70–99)
Potassium: 4.3 mmol/L (ref 3.5–5.1)
Sodium: 139 mmol/L (ref 135–145)
Total Bilirubin: 2.3 mg/dL — ABNORMAL HIGH (ref 0.3–1.2)
Total Protein: 6.5 g/dL (ref 6.5–8.1)

## 2019-08-17 LAB — PHOSPHORUS: Phosphorus: 2.7 mg/dL (ref 2.5–4.6)

## 2019-08-17 LAB — RAPID URINE DRUG SCREEN, HOSP PERFORMED
Amphetamines: NOT DETECTED
Barbiturates: NOT DETECTED
Benzodiazepines: NOT DETECTED
Cocaine: NOT DETECTED
Opiates: NOT DETECTED
Tetrahydrocannabinol: NOT DETECTED

## 2019-08-17 LAB — PROCALCITONIN: Procalcitonin: 0.11 ng/mL

## 2019-08-17 LAB — URINALYSIS, ROUTINE W REFLEX MICROSCOPIC
Bacteria, UA: NONE SEEN
Bilirubin Urine: NEGATIVE
Glucose, UA: NEGATIVE mg/dL
Ketones, ur: 5 mg/dL — AB
Leukocytes,Ua: NEGATIVE
Nitrite: NEGATIVE
Protein, ur: 30 mg/dL — AB
Specific Gravity, Urine: 1.019 (ref 1.005–1.030)
pH: 5 (ref 5.0–8.0)

## 2019-08-17 LAB — ETHANOL: Alcohol, Ethyl (B): <10 mg/dL (ref <10)

## 2019-08-17 LAB — LACTIC ACID, PLASMA: Lactic Acid, Venous: 1.4 mmol/L (ref 0.5–1.9)

## 2019-08-17 LAB — MAGNESIUM: Magnesium: 1.5 mg/dL — ABNORMAL LOW (ref 1.7–2.4)

## 2019-08-17 LAB — TSH: TSH: 1.028 u[IU]/mL (ref 0.350–4.500)

## 2019-08-17 LAB — AMMONIA: Ammonia: 13 umol/L (ref 9–35)

## 2019-08-17 LAB — SARS CORONAVIRUS 2 (TAT 6-24 HRS): SARS Coronavirus 2: NEGATIVE

## 2019-08-17 MED ORDER — AMLODIPINE BESYLATE 5 MG PO TABS
5.0000 mg | ORAL_TABLET | Freq: Every day | ORAL | Status: DC
  Administered 2019-08-17 – 2019-08-19 (×3): 5 mg via ORAL
  Filled 2019-08-17 (×3): qty 1

## 2019-08-17 MED ORDER — ONDANSETRON HCL 4 MG PO TABS: 4.0000 mg | ORAL_TABLET | Freq: Four times a day (QID) | ORAL | Status: DC | PRN

## 2019-08-17 MED ORDER — NITROGLYCERIN 0.4 MG SL SUBL: 0.4000 mg | SUBLINGUAL_TABLET | SUBLINGUAL | Status: DC | PRN

## 2019-08-17 MED ORDER — SIMVASTATIN 20 MG PO TABS
20.0000 mg | ORAL_TABLET | Freq: Every day | ORAL | Status: DC
  Administered 2019-08-18 – 2019-08-21 (×4): 20 mg via ORAL
  Filled 2019-08-17 (×4): qty 1

## 2019-08-17 MED ORDER — BUPIVACAINE HCL (PF) 0.5 % IJ SOLN
10.0000 mL | Freq: Once | INTRAMUSCULAR | Status: AC
  Administered 2019-08-17: 14:00:00 10 mL
  Filled 2019-08-17: qty 10

## 2019-08-17 MED ORDER — VANCOMYCIN HCL 1500 MG/300ML IV SOLN
1500.0000 mg | Freq: Once | INTRAVENOUS | Status: AC
  Administered 2019-08-17: 1500 mg via INTRAVENOUS
  Filled 2019-08-17: qty 300

## 2019-08-17 MED ORDER — ONDANSETRON HCL 4 MG/2ML IJ SOLN: 4.0000 mg | Freq: Four times a day (QID) | INTRAMUSCULAR | Status: DC | PRN

## 2019-08-17 MED ORDER — SODIUM CHLORIDE 0.9 % IV SOLN
2.0000 g | INTRAVENOUS | Status: DC
  Administered 2019-08-18: 16:00:00 2 g via INTRAVENOUS
  Filled 2019-08-17: qty 2

## 2019-08-17 MED ORDER — ISOSORBIDE MONONITRATE ER 60 MG PO TB24
90.0000 mg | ORAL_TABLET | Freq: Every day | ORAL | Status: DC
  Administered 2019-08-17 – 2019-08-22 (×6): 90 mg via ORAL
  Filled 2019-08-17 (×5): qty 1
  Filled 2019-08-17: qty 3

## 2019-08-17 MED ORDER — COLCHICINE 0.6 MG PO TABS
0.6000 mg | ORAL_TABLET | Freq: Once | ORAL | Status: AC
  Administered 2019-08-17: 0.6 mg via ORAL
  Filled 2019-08-17: qty 1

## 2019-08-17 MED ORDER — APIXABAN 2.5 MG PO TABS
2.5000 mg | ORAL_TABLET | Freq: Two times a day (BID) | ORAL | Status: DC
  Administered 2019-08-17 – 2019-08-22 (×10): 2.5 mg via ORAL
  Filled 2019-08-17 (×11): qty 1

## 2019-08-17 MED ORDER — SODIUM CHLORIDE 0.9 % IV BOLUS
500.0000 mL | Freq: Once | INTRAVENOUS | Status: AC
  Administered 2019-08-17: 500 mL via INTRAVENOUS

## 2019-08-17 MED ORDER — SODIUM CHLORIDE 0.9 % IV SOLN
2.0000 g | Freq: Once | INTRAVENOUS | Status: AC
  Administered 2019-08-17: 14:00:00 2 g via INTRAVENOUS
  Filled 2019-08-17: qty 2

## 2019-08-17 MED ORDER — HYDROMORPHONE HCL 1 MG/ML IJ SOLN: 0.5000 mg | INTRAMUSCULAR | Status: DC | PRN

## 2019-08-17 MED ORDER — ACETAMINOPHEN 325 MG PO TABS
650.0000 mg | ORAL_TABLET | Freq: Four times a day (QID) | ORAL | Status: DC | PRN
  Administered 2019-08-19 – 2019-08-21 (×5): 650 mg via ORAL
  Filled 2019-08-17 (×7): qty 2

## 2019-08-17 MED ORDER — VANCOMYCIN HCL 1250 MG/250ML IV SOLN: 1250.0000 mg | INTRAVENOUS | Status: DC

## 2019-08-17 MED ORDER — ACETAMINOPHEN 650 MG RE SUPP: 650.0000 mg | Freq: Four times a day (QID) | RECTAL | Status: DC | PRN

## 2019-08-17 MED ORDER — VANCOMYCIN HCL IN DEXTROSE 1-5 GM/200ML-% IV SOLN: 1000.0000 mg | Freq: Once | INTRAVENOUS | Status: DC

## 2019-08-17 NOTE — Progress Notes (Signed)
Pharmacy Antibiotic Note  Jesus Beck is a 86 y.o. male admitted on 08/17/2019 with sepsis.  Pharmacy has been consulted for Vancomycin and Cefepime dosing. Pt febrile (Tm 101), WBC wnl. Scr elevated at 2. CXR showing questionable infiltrate.   Plan: Vancomycin 1500 mg IV once, then 1250mg  IV Q48 hrs (Est AUC 514, Scr used 2, Vd 0.72) Cefepime 2g IV Q24 hrs Monitor renal function, cultures/sensitivities, clinical progress Check vancomycin levels as indicated     Temp (24hrs), Avg:99.7 F (37.6 C), Min:98.3 F (36.8 C), Max:101 F (38.3 C)  Recent Labs  Lab 08/17/19 0929  WBC 9.8  CREATININE 2.00*    CrCl cannot be calculated (Unknown ideal weight.).    Allergies  Allergen Reactions  . Lipitor [Atorvastatin] Hives    Antimicrobials this admission: Vancomycin 4/15 >>  Cefepime 4/15 >>   Dose adjustments this admission: N/A  Microbiology results: 4/15 BCx:   Richardine Service, PharmD PGY1 Pharmacy Resident Phone: [phone] 08/17/2019  1:38 PM  Please check [email or web address] for unit-specific pharmacy phone numbers.

## 2019-08-17 NOTE — ED Provider Notes (Signed)
[facility] EMERGENCY DEPARTMENT Provider Note   CSN: [id number] Arrival date & time: 08/17/19  0009     History Chief Complaint  Patient presents with  . Knee Pain  . Shoulder Pain    Jesus Beck is a 86 y.o. male.  Patient with past medical history remarkable for afib on Eliquis, CAD, HTN, dyslipidemia, and h/o gout in L knee, ankle, and foot -- presents for left knee pain and right shoulder pain x 1 day. Daughter Caren Griffins) is at bedside and assists in providing patient history.  She reports dental pain and poor oral intake in the past 2 days, seen by dentist, and told he had abrasions of gums from brushing and dental use.  Starting yesterday, she reports a change in mental status, increased confusion, and bilateral leg weakness x 1 day, stating "it's just not him."  She reports that he wanted to go to the store to buy strawberries last night and also iron close late at night.  She reports being able to get him on his feet, but he was unable to walk, at least in part due to left knee pain and swelling, prompting ED visit via EMS.  Patient denies fever, chest pain, dyspnea, cough, nv, abdominal, pain, dysuria, and hematuria.  He endorses right shoulder pain, left knee pain, diarrhea, and bilateral lower extremity weakness.  No recent medication changes.  Level 5 caveat due to altered mental status.  COVID vaccine UTD.         Past Medical History:  Diagnosis Date  . Atrial fibrillation (HCC)    paroxysmal atrial fibrillation- stopped sotolol due to HR 44 (on 40mg  BID)  . Atrial fibrillation ([place])   . Back pain   . CAD (coronary artery disease)    s/p CABG 1999 with LIMA to MAD, SVG to first diagonal, SVG to RCA and PCI with DES mid LAD 06/09 and residual 70% D2 not amenable to PCI due to small caliber Cardiology Dr. Marlou Porch  . Carotid artery stenosis     s/p bialteral CEA's 2004 with persistent bruit on left and minimal plaque by carotid dopplers 2010  .  Dyslipidemia   . HTN (hypertension)   . Hypercholesteremia   . Hypersomnia   . RBBB   . Renal insufficiency     Patient Active Problem List   Diagnosis Date Noted  . Pacemaker 11/26/2014  . Atrial fibrillation with slow ventricular response ([place]) 08/27/2014  . Symptomatic bradycardia 08/27/2014  . CHB (complete heart block) ([place]) 08/27/2014  . Atrial fibrillation ([place])   . CAD (coronary artery disease)   . Carotid artery stenosis   . RBBB   . HTN (hypertension)   . Hypercholesteremia     Past Surgical History:  Procedure Laterality Date  . CAROTID ENDARTERECTOMY Bilateral   . CHOLECYSTECTOMY    . CORONARY ARTERY BYPASS GRAFT    . EP IMPLANTABLE DEVICE N/A 08/27/2014   Procedure: PACEMAKER IMPLANT;  Surgeon: Belva Crome, MD;  Location: [facility] C[facility] LAB;  Service: Cardiovascular;  Laterality: N/A;  . HERNIA REPAIR    . INTRAOCULAR LENS INSERTION    . PERMANENT PACEMAKER INSERTION N/A 08/27/2014   Procedure: Daneil Dan;  Surgeon: Belva Crome, MD;  Location: [facility] CATH LAB;  Service: Cardiovascular;  Laterality: N/A;  . PERMANENT PACEMAKER INSERTION N/A 08/27/2014   Procedure: PERMANENT PACEMAKER INSERTION;  Surgeon: Evans Lance, MD;  Location: [facility] CATH LAB;  Service: Cardiovascular;  Laterality: N/A;  Family History  Problem Relation Age of Onset  . Diabetes Mother   . Heart disease Father     Social History   Tobacco Use  . Smoking status: Never Smoker  . Smokeless tobacco: Never Used  Substance Use Topics  . Alcohol use: No  . Drug use: No    Home Medications Prior to Admission medications   Medication Sig Start Date End Date Taking? Authorizing Provider  amLODipine (NORVASC) 5 MG tablet TAKE 1 TABLET BY MOUTH DAILY Patient taking differently: Take by mouth daily.  03/13/19  Yes Jerline Pain, MD  COLCRYS 0.6 MG tablet Take 0.6 mg by mouth daily. 08/01/19  Yes [provider]  ELIQUIS 2.5 MG TABS tablet TAKE 1 TABLET(2.5 MG) BY MOUTH TWICE  DAILY Patient taking differently: Take 2.5 mg by mouth 2 (two) times daily.  07/04/19  Yes Jerline Pain, MD  furosemide (LASIX) 20 MG tablet Take 20 mg by mouth daily.    Yes [provider]  isosorbide mononitrate (IMDUR) 60 MG 24 hr tablet TAKE 1 AND 1/2 TABLETS BY MOUTH DAILY Patient taking differently: Take 90 mg by mouth daily. TAKE 1 AND 1/2 TABLETS BY MOUTH DAILY 08/16/19  Yes Jerline Pain, MD  losartan (COZAAR) 100 MG tablet Take 1 tablet (100 mg total) by mouth daily. 06/16/19  Yes Jerline Pain, MD  nitroGLYCERIN (NITROSTAT) 0.4 MG SL tablet Place 1 tablet (0.4 mg total) under the tongue every 5 (five) minutes as needed for chest pain. 01/27/17  Yes Jerline Pain, MD  Omega-3 Fatty Acids (OMEGA 3 PO) Take 2,000 Units by mouth 2 (two) times daily.    Yes [provider]  simvastatin (ZOCOR) 20 MG tablet TAKE 1 TABLET BY MOUTH EVERY NIGHT AT BEDTIME Patient taking differently: Take 20 mg by mouth daily at 6 PM.    Yes Skains, Thana Farr, MD  traMADol (ULTRAM) 50 MG tablet Take 50 mg by mouth daily as needed (pain).   Yes [provider]    Allergies    Lipitor [atorvastatin]  Review of Systems   Review of Systems  Musculoskeletal: Positive for arthralgias, gait problem and joint swelling.  Psychiatric/Behavioral: Positive for confusion.  All other systems reviewed and are negative.   Physical Exam Updated Vital Signs BP (!) 146/62 (BP Location: Right Arm)   Pulse 85   Temp 98.3 F (36.8 C) (Oral)   Resp 16   SpO2 95%   Physical Exam Vitals and nursing note reviewed.  Constitutional:      Appearance: He is well-developed.  HENT:     Head: Normocephalic and atraumatic.     Mouth/Throat:     Mouth: Mucous membranes are moist.     Comments: Mild abrasions noted to the oral mucosa. Eyes:     General:        Right eye: No discharge.        Left eye: No discharge.     Conjunctiva/sclera: Conjunctivae normal.  Cardiovascular:     Rate and Rhythm:  Normal rate and regular rhythm.     Heart sounds: Normal heart sounds.  Pulmonary:     Effort: Pulmonary effort is normal. No respiratory distress.     Breath sounds: Normal breath sounds. No wheezing or rhonchi.  Abdominal:     Palpations: Abdomen is soft.     Tenderness: There is no abdominal tenderness. There is no guarding or rebound.  Musculoskeletal:     Right shoulder: Tenderness present. No  bony tenderness. Normal range of motion.     Left shoulder: No tenderness or bony tenderness. Normal range of motion.     Cervical back: Normal range of motion and neck supple.     Right knee: No effusion. Normal range of motion. No tenderness.     Left knee: Effusion present. Decreased range of motion (Mildly decreased). Tenderness present.  Skin:    General: Skin is warm and dry.  Neurological:     Mental Status: He is alert.     ED Results / Procedures / Treatments   Labs (all labs ordered are listed, but only abnormal results are displayed) Labs Reviewed  CBC WITH DIFFERENTIAL/PLATELET - Abnormal; Notable for the following components:      Result Value   Platelets 99 (*)    Neutro Abs 8.0 (*)    All other components within normal limits  COMPREHENSIVE METABOLIC PANEL - Abnormal; Notable for the following components:   Glucose, Bld 109 (*)    BUN 39 (*)    Creatinine, Ser 2.00 (*)    Albumin 3.2 (*)    Total Bilirubin 2.3 (*)    GFR calc non Af Amer 28 (*)    GFR calc Af Amer 33 (*)    All other components within normal limits  URINALYSIS, ROUTINE W REFLEX MICROSCOPIC - Abnormal; Notable for the following components:   Hgb urine dipstick SMALL (*)    Ketones, ur 5 (*)    Protein, ur 30 (*)    All other components within normal limits  POCT I-STAT 7, (LYTES, BLD GAS, ICA,H+H) - Abnormal; Notable for the following components:   pO2, Arterial 52.0 (*)    TCO2 21 (*)    Acid-base deficit 4.0 (*)    HCT 36.0 (*)    Hemoglobin 12.2 (*)    All other components within normal  limits  SARS CORONAVIRUS 2 (TAT 6-24 HRS)  CULTURE, BLOOD (ROUTINE X 2)  CULTURE, BLOOD (ROUTINE X 2)  LACTIC ACID, PLASMA  PROCALCITONIN  ETHANOL  RAPID URINE DRUG SCREEN, HOSP PERFORMED  AMMONIA  I-STAT ARTERIAL BLOOD GAS, ED    EKG EKG Interpretation  Date/Time:  Thursday August 17 2019 09:28:10 EDT Ventricular Rate:  60 PR Interval:    QRS Duration: 192 QT Interval:  498 QTC Calculation: 498 R Axis:   -71 Text Interpretation: Afib/flutter and ventricular-paced rhythm No further analysis attempted due to paced rhythm Confirmed by Pattricia Boss [phone]) on 08/17/2019 12:36:35 PM   Radiology DG Shoulder Right  Result Date: 08/17/2019 CLINICAL DATA:  Right shoulder pain EXAM: RIGHT SHOULDER - 2+ VIEW COMPARISON:  None. FINDINGS: Frontal and transscapular views of the right shoulder are obtained. No displaced fracture. Alignment is anatomic. Mild hypertrophic changes of the acromioclavicular joint. Chronic scarring throughout the right lung. IMPRESSION: 1. Mild acromioclavicular joint arthritis.  No acute fracture. Electronically Signed   By: Randa Ngo M.D.   On: 08/17/2019 00:54   DG Knee Complete 4 Views Left  Result Date: 08/17/2019 CLINICAL DATA:  Knee pain EXAM: LEFT KNEE - COMPLETE 4+ VIEW COMPARISON:  01/24/2018 FINDINGS: Vascular calcifications. Faint joint space calcification. No fracture or malalignment. Minimal patellofemoral degenerative change. Small effusion IMPRESSION: 1. No acute osseous abnormality 2. Trace effusion 3. Chondrocalcinosis.  Minimal patellofemoral degenerative change Electronically Signed   By: Donavan Foil M.D.   On: 08/17/2019 00:55    Procedures Procedures (including critical care time)  Medications Ordered in ED Medications  ceFEPIme (MAXIPIME) 2 g in sodium  chloride 0.9 % 100 mL IVPB (has no administration in time range)  vancomycin (VANCOCIN) IVPB 1000 mg/200 mL premix (has no administration in time range)  sodium chloride 0.9 % bolus  500 mL (0 mLs Intravenous Stopped 08/17/19 1246)  colchicine tablet 0.6 mg (0.6 mg Oral Given 08/17/19 1242)    ED Course  I have reviewed the triage vital signs and the nursing notes.  Pertinent labs & imaging results that were available during my care of the patient were reviewed by me and considered in my medical decision making (see chart for details).  Patient seen and examined.  Reviewed x-rays.  No fractures.  Left knee is suspicious for gouty arthritis flare.  However, patient has had some delirium and increased confusion at home as well as inability to walk.  He has oxygen saturation in the upper 80s on room air but appears to be in no respiratory distress.  He will need further work-up including lab work, chest x-ray, UA.  Vital signs reviewed and are as follows: BP (!) 146/62 (BP Location: Right Arm)   Pulse 85   Temp 98.3 F (36.8 C) (Oral)   Resp 16   SpO2 95%   Patient with questionable infiltrate on chest x-ray however normal white blood cell count.  Given altered mental status in setting of hypoxia, ABG was ordered which demonstrated normal PCO2.  Patient was seen with Dr. Jeanell Sparrow.  Patient continued to feel warm and rectal temperature was obtained.  This was 1 F.  For this reason, patient was started on broad-spectrum antibiotics given unclear etiology of temperature and concern for infection.  UA is clear.  Will need admission for continued monitoring.  Colchicine given previously for presumed gouty flare in the left knee.  Lower concern for septic arthritis.      MDM Rules/Calculators/A&P                      Admit.    Final Clinical Impression(s) / ED Diagnoses Final diagnoses:  Febrile illness  Hypoxia  Effusion of left knee  History of gout    Rx / DC Orders ED Discharge Orders    None       Carlisle Cater, PA-C 08/17/19 1328    Pattricia Boss, MD 08/17/19 1657

## 2019-08-17 NOTE — ED Triage Notes (Signed)
Pt comes via [facility] from home, c/o of pain in L knee and R shoulder for one day, hx of gout, denies injury

## 2019-08-17 NOTE — Consult Note (Signed)
Reason for Consult:Left knee pain Referring Physician: R Pahwani  Jesus Beck is an 86 y.o. male.  HPI: Jesus Beck came to the ED yesterday with confusion. He had been c/o right shoulder and left knee pain for a few days prior to that. He is still somewhat confused but able to confirm basic parts of history. Son was there to aid in that. He does have a hx/o gout. Hospitalist worried about septic joint with fever of 101 today and consulted orthopedic surgery.  Past Medical History:  Diagnosis Date  . Atrial fibrillation (HCC)    paroxysmal atrial fibrillation- stopped sotolol due to HR 44 (on 40mg  BID)  . Atrial fibrillation ([place])   . Back pain   . CAD (coronary artery disease)    s/p CABG 1999 with LIMA to MAD, SVG to first diagonal, SVG to RCA and PCI with DES mid LAD 06/09 and residual 70% D2 not amenable to PCI due to small caliber Cardiology Dr. Marlou Porch  . Carotid artery stenosis     s/p bialteral CEA's 2004 with persistent bruit on left and minimal plaque by carotid dopplers 2010  . Dyslipidemia   . HTN (hypertension)   . Hypercholesteremia   . Hypersomnia   . RBBB   . Renal insufficiency     Past Surgical History:  Procedure Laterality Date  . CAROTID ENDARTERECTOMY Bilateral   . CHOLECYSTECTOMY    . CORONARY ARTERY BYPASS GRAFT    . EP IMPLANTABLE DEVICE N/A 08/27/2014   Procedure: PACEMAKER IMPLANT;  Surgeon: Belva Crome, MD;  Location: [facility] CATH LAB;  Service: Cardiovascular;  Laterality: N/A;  . HERNIA REPAIR    . INTRAOCULAR LENS INSERTION    . PERMANENT PACEMAKER INSERTION N/A 08/27/2014   Procedure: Daneil Dan;  Surgeon: Belva Crome, MD;  Location: [facility] CATH LAB;  Service: Cardiovascular;  Laterality: N/A;  . PERMANENT PACEMAKER INSERTION N/A 08/27/2014   Procedure: PERMANENT PACEMAKER INSERTION;  Surgeon: Evans Lance, MD;  Location: [facility] CATH LAB;  Service: Cardiovascular;  Laterality: N/A;    Family History  Problem Relation Age of Onset  . Diabetes Mother   . Heart  disease Father     Social History:  reports that he has never smoked. He has never used smokeless tobacco. He reports that he does not drink alcohol or use drugs.  Allergies:  Allergies  Allergen Reactions  . Lipitor [Atorvastatin] Hives    Medications: I have reviewed the patient's current medications.  Results for orders placed or performed during the hospital encounter of 08/17/19 (from the past 48 hour(s))  CBC with Differential/Platelet     Status: Abnormal   Collection Time: 08/17/19  9:29 AM  Result Value Ref Range   WBC 9.8 4.0 - 10.5 K/uL   RBC 4.29 4.22 - 5.81 MIL/uL   Hemoglobin 13.5 13.0 - 17.0 g/dL   HCT 41.4 39.0 - 52.0 %   MCV 96.5 80.0 - 100.0 fL   MCH 31.5 26.0 - 34.0 pg   MCHC 32.6 30.0 - 36.0 g/dL   RDW 13.5 11.5 - 15.5 %   Platelets 99 (L) 150 - 400 K/uL    Comment: SPECIMEN CHECKED FOR CLOTS Immature Platelet Fraction may be clinically indicated, consider ordering this additional test [id number] PLATELET COUNT CONFIRMED BY SMEAR REPEATED TO VERIFY    nRBC 0.0 0.0 - 0.2 %   Neutrophils Relative % 82 %   Neutro Abs 8.0 (H) 1.7 - 7.7 K/uL   Lymphocytes Relative 7 %  Lymphs Abs 0.7 0.7 - 4.0 K/uL   Monocytes Relative 10 %   Monocytes Absolute 1.0 0.1 - 1.0 K/uL   Eosinophils Relative 0 %   Eosinophils Absolute 0.0 0.0 - 0.5 K/uL   Basophils Relative 0 %   Basophils Absolute 0.0 0.0 - 0.1 K/uL   Immature Granulocytes 1 %   Abs Immature Granulocytes 0.06 0.00 - 0.07 K/uL    Comment: Performed at [facility] [address], [place], [place] [address]  Comprehensive metabolic panel     Status: Abnormal   Collection Time: 08/17/19  9:29 AM  Result Value Ref Range   Sodium 139 135 - 145 mmol/L   Potassium 4.3 3.5 - 5.1 mmol/L   Chloride 102 98 - 111 mmol/L   CO2 22 22 - 32 mmol/L   Glucose, Bld 109 (H) 70 - 99 mg/dL    Comment: Glucose reference range applies only to samples taken after fasting for at least 8 hours.   BUN 39 (H) 8 - 23 mg/dL    Creatinine, Ser 2.00 (H) 0.61 - 1.24 mg/dL   Calcium 9.4 8.9 - 10.3 mg/dL   Total Protein 6.5 6.5 - 8.1 g/dL   Albumin 3.2 (L) 3.5 - 5.0 g/dL   AST 32 15 - 41 U/L   ALT 21 0 - 44 U/L   Alkaline Phosphatase 70 38 - 126 U/L   Total Bilirubin 2.3 (H) 0.3 - 1.2 mg/dL   GFR calc non Af Amer 28 (L) >60 mL/min   GFR calc Af Amer 33 (L) >60 mL/min   Anion gap 15 5 - 15    Comment: Performed at [facility] [address], [place], [place] [address]  Urinalysis, Routine w reflex microscopic     Status: Abnormal   Collection Time: 08/17/19  9:29 AM  Result Value Ref Range   Color, Urine YELLOW YELLOW   APPearance CLEAR CLEAR   Specific Gravity, Urine 1.019 1.005 - 1.030   pH 5.0 5.0 - 8.0   Glucose, UA NEGATIVE NEGATIVE mg/dL   Hgb urine dipstick SMALL (A) NEGATIVE   Bilirubin Urine NEGATIVE NEGATIVE   Ketones, ur 5 (A) NEGATIVE mg/dL   Protein, ur 30 (A) NEGATIVE mg/dL   Nitrite NEGATIVE NEGATIVE   Leukocytes,Ua NEGATIVE NEGATIVE   RBC / HPF 0-5 0 - 5 RBC/hpf   WBC, UA 0-5 0 - 5 WBC/hpf   Bacteria, UA NONE SEEN NONE SEEN   Mucus PRESENT    Hyaline Casts, UA PRESENT     Comment: Performed at [facility] [address], [place], [place] [address]  I-STAT 7, (LYTES, BLD GAS, ICA, H+H)     Status: Abnormal   Collection Time: 08/17/19 12:07 PM  Result Value Ref Range   pH, Arterial 7.407 7.350 - 7.450   pCO2 arterial 32.2 32.0 - 48.0 mmHg   pO2, Arterial 52.0 (L) 83.0 - 108.0 mmHg   Bicarbonate 20.3 20.0 - 28.0 mmol/L   TCO2 21 (L) 22 - 32 mmol/L   O2 Saturation 87.0 %   Acid-base deficit 4.0 (H) 0.0 - 2.0 mmol/L   Sodium 136 135 - 145 mmol/L   Potassium 3.9 3.5 - 5.1 mmol/L   Calcium, Ion 1.25 1.15 - 1.40 mmol/L   HCT 36.0 (L) 39.0 - 52.0 %   Hemoglobin 12.2 (L) 13.0 - 17.0 g/dL   Patient temperature HIDE    Sample type ARTERIAL   Ethanol     Status: None  Collection Time: 08/17/19  1:25 PM  Result Value Ref Range   Alcohol, Ethyl (B) <10 <10 mg/dL     Comment: (NOTE) Lowest detectable limit for serum alcohol is 10 mg/dL. For medical purposes only. Performed at [facility], [place] [address], [place], [place] [address]   Magnesium     Status: Abnormal   Collection Time: 08/17/19  1:37 PM  Result Value Ref Range   Magnesium 1.5 (L) 1.7 - 2.4 mg/dL    Comment: Performed at [facility] [address] [place], [place] [address]  Phosphorus     Status: None   Collection Time: 08/17/19  1:37 PM  Result Value Ref Range   Phosphorus 2.7 2.5 - 4.6 mg/dL    Comment: Performed at [facility] [address], [place], [place] [address]  Lactic acid, plasma     Status: None   Collection Time: 08/17/19  1:50 PM  Result Value Ref Range   Lactic Acid, Venous 1.4 0.5 - 1.9 mmol/L    Comment: Performed at [facility] [address], [place], [place] [address]    DG Chest 2 View  Result Date: 08/17/2019 CLINICAL DATA:  Weakness. EXAM: CHEST - 2 VIEW COMPARISON:  08/28/2014 FINDINGS: Single lead pacer device in situ enters via left subclavian approach. Power pack over left chest. Changes of median sternotomy with CABG. Cardiomediastinal contours are stable. Linear scarring or atelectasis in the right mid chest. Right hemidiaphragmatic elevation similar to prior study. Vague airspace and interstitial opacities overlying the right hemidiaphragm. No sign of pleural effusion. Visualized skeletal structures are unremarkable. IMPRESSION: 1. Interstitial and airspace opacities over the right hemidiaphragm likely juxta diaphragmatic atelectasis. Difficult to exclude developing infection. 2. Stable right hemidiaphragmatic elevation. Electronically Signed   By: Zetta Bills M.D.   On: 08/17/2019 09:57   DG Shoulder Right  Result Date: 08/17/2019 CLINICAL DATA:  Right shoulder pain EXAM: RIGHT SHOULDER - 2+ VIEW COMPARISON:  None. FINDINGS: Frontal and transscapular views of the right shoulder are obtained. No displaced fracture.  Alignment is anatomic. Mild hypertrophic changes of the acromioclavicular joint. Chronic scarring throughout the right lung. IMPRESSION: 1. Mild acromioclavicular joint arthritis.  No acute fracture. Electronically Signed   By: Randa Ngo M.D.   On: 08/17/2019 00:54   DG Knee Complete 4 Views Left  Result Date: 08/17/2019 CLINICAL DATA:  Knee pain EXAM: LEFT KNEE - COMPLETE 4+ VIEW COMPARISON:  01/24/2018 FINDINGS: Vascular calcifications. Faint joint space calcification. No fracture or malalignment. Minimal patellofemoral degenerative change. Small effusion IMPRESSION: 1. No acute osseous abnormality 2. Trace effusion 3. Chondrocalcinosis.  Minimal patellofemoral degenerative change Electronically Signed   By: Donavan Foil M.D.   On: 08/17/2019 00:55    Review of Systems  Unable to perform ROS: Mental status change  Musculoskeletal: Positive for arthralgias (Left knee).   Blood pressure (!) 144/62, pulse 61, temperature (S) (!) 101 F (38.3 C), temperature source Rectal, resp. rate (!) 25, height 5\' 10"  (1.778 m), weight 67.6 kg, SpO2 93 %. Physical Exam  Constitutional: He appears well-developed and well-nourished. No distress.  HENT:  Head: Normocephalic and atraumatic.  Eyes: Conjunctivae are normal. Right eye exhibits no discharge. Left eye exhibits no discharge. No scleral icterus.  Cardiovascular: Normal rate and regular rhythm.  Respiratory: Effort normal. No respiratory distress.  Musculoskeletal:     Cervical back: Normal range of motion.     Comments: LLE No traumatic wounds, ecchymosis, or rash  Mod TTP knee, able to actively range ~45 degrees  No ankle effusion, mod knee effusion  Knee stable to varus/ valgus and anterior/posterior stress  Sens DPN, SPN, TN intact  Motor EHL, ext, flex, evers 5/5  DP 1+, PT 1+, 2+ pitting edema  Neurological: He is alert.  Skin: Skin is warm and dry. He is not diaphoretic.  Psychiatric: He has a normal mood and affect. His behavior is  normal.    Assessment/Plan: Left knee pain -- Will perform arthrocentesis though possibility of septic arthritis very low. Gout/CPPD most likely. Multiple medical problems including coronary artery disease status post CABG in 1999, A. fib on Eliquis, hypertension, hyperlipidemia, CKD stage III, heart block status post pacemaker, and gout -- per primary service    Lisette Abu, PA-C Orthopedic Surgery [phone] 08/17/2019, 2:53 PM

## 2019-08-17 NOTE — H&P (Signed)
History and Physical    Jesus Beck [id number] DOB: 1926/04/20 DOA: 08/17/2019  PCP: Seward Carol, MD  Patient coming from: home  I have personally briefly reviewed patient's old medical records in [place]  Chief Complaint: Right shoulder pain and left knee pain since 4 days.  Confusion since last night  HPI: Jesus Beck is a 86 y.o. male with medical history significant of coronary artery disease status post CABG in 1999, A. fib on Eliquis, hypertension, hyperlipidemia, CKD stage III, heart block status post pacemaker, gout presents to emergency department due to right shoulder pain and left knee pain since 4 days.  As per daughter Jesus Beck over the phone: Patient has been confused since last night.  She reports that he wanted to go to the store to buy strawberries last night and also iron clothes late at night.  Reports that he was not like himself.  Has generalized weakness, dental pain and decreased appetite since 2 days.  Reports diarrhea started Sunday however resolved on its own.  Has history of gout in left knee, ankle and foot.  Takes colchicine and tramadol at home.  Reports left knee swelling and pain which is progressive.  Unable to walk due to left knee pain.    No history of headache, blurry vision, loss of consciousness, head trauma, slurred speech, facial droop, fever, chills, nausea, vomiting, stomachache, urinary or sleep changes.  Patient lives with his family at home.  No history of smoking, alcohol, street drug use.  He is independent on daily life activities.  No recent medication changes.  Covid vaccine: Up-to-date.  ED Course: Upon arrival to ED: Patient had fever of 101, tachypneic, no leukocytosis, platelet: 99, CMP shows AKI on CKD stage III, UA negative for infection.  Blood cultures, lactic acid and COVID-19 pending.  Hypoxic upon arrival.  Requiring 3 L of oxygen via nasal cannula.  X-ray of left knee shows trace effusion, chondrocalcinosis.  Minimal  patellofemoral degenerative change.  X-ray of right shoulder shows mild acromioclavicular joint arthritis.  Chest x-ray shows interstitial and airspace opacities over the right hemidiaphragm.  Difficult to exclude a developing infection.  Patient received broad-spectrum antibiotics vancomycin and cefepime in ED.  [facility] hospitalist consulted for admission for septic arthritis versus gouty flare. Review of Systems: As per HPI otherwise negative.    Past Medical History:  Diagnosis Date  . Atrial fibrillation (HCC)    paroxysmal atrial fibrillation- stopped sotolol due to HR 44 (on 40mg  BID)  . Atrial fibrillation ([place])   . Back pain   . CAD (coronary artery disease)    s/p CABG 1999 with LIMA to MAD, SVG to first diagonal, SVG to RCA and PCI with DES mid LAD 06/09 and residual 70% D2 not amenable to PCI due to small caliber Cardiology Dr. Marlou Porch  . Carotid artery stenosis     s/p bialteral CEA's 2004 with persistent bruit on left and minimal plaque by carotid dopplers 2010  . Dyslipidemia   . HTN (hypertension)   . Hypercholesteremia   . Hypersomnia   . RBBB   . Renal insufficiency     Past Surgical History:  Procedure Laterality Date  . CAROTID ENDARTERECTOMY Bilateral   . CHOLECYSTECTOMY    . CORONARY ARTERY BYPASS GRAFT    . EP IMPLANTABLE DEVICE N/A 08/27/2014   Procedure: PACEMAKER IMPLANT;  Surgeon: Belva Crome, MD;  Location: [facility] CATH LAB;  Service: Cardiovascular;  Laterality: N/A;  . HERNIA REPAIR    .  INTRAOCULAR LENS INSERTION    . PERMANENT PACEMAKER INSERTION N/A 08/27/2014   Procedure: Daneil Dan;  Surgeon: Belva Crome, MD;  Location: [facility] CATH LAB;  Service: Cardiovascular;  Laterality: N/A;  . PERMANENT PACEMAKER INSERTION N/A 08/27/2014   Procedure: PERMANENT PACEMAKER INSERTION;  Surgeon: Evans Lance, MD;  Location: [facility] CATH LAB;  Service: Cardiovascular;  Laterality: N/A;     reports that he has never smoked. He has never used smokeless tobacco. He reports that he  does not drink alcohol or use drugs.  Allergies  Allergen Reactions  . Lipitor [Atorvastatin] Hives    Family History  Problem Relation Age of Onset  . Diabetes Mother   . Heart disease Father     Prior to Admission medications   Medication Sig Start Date End Date Taking? Authorizing Provider  amLODipine (NORVASC) 5 MG tablet TAKE 1 TABLET BY MOUTH DAILY Patient taking differently: Take by mouth daily.  03/13/19  Yes Jerline Pain, MD  COLCRYS 0.6 MG tablet Take 0.6 mg by mouth daily. 08/01/19  Yes [provider]  ELIQUIS 2.5 MG TABS tablet TAKE 1 TABLET(2.5 MG) BY MOUTH TWICE DAILY Patient taking differently: Take 2.5 mg by mouth 2 (two) times daily.  07/04/19  Yes Jerline Pain, MD  furosemide (LASIX) 20 MG tablet Take 20 mg by mouth daily.    Yes [provider]  isosorbide mononitrate (IMDUR) 60 MG 24 hr tablet TAKE 1 AND 1/2 TABLETS BY MOUTH DAILY Patient taking differently: Take 90 mg by mouth daily. TAKE 1 AND 1/2 TABLETS BY MOUTH DAILY 08/16/19  Yes Jerline Pain, MD  losartan (COZAAR) 100 MG tablet Take 1 tablet (100 mg total) by mouth daily. 06/16/19  Yes Jerline Pain, MD  nitroGLYCERIN (NITROSTAT) 0.4 MG SL tablet Place 1 tablet (0.4 mg total) under the tongue every 5 (five) minutes as needed for chest pain. 01/27/17  Yes Jerline Pain, MD  Omega-3 Fatty Acids (OMEGA 3 PO) Take 2,000 Units by mouth 2 (two) times daily.    Yes [provider]  simvastatin (ZOCOR) 20 MG tablet TAKE 1 TABLET BY MOUTH EVERY NIGHT AT BEDTIME Patient taking differently: Take 20 mg by mouth daily at 6 PM.    Yes Skains, Thana Farr, MD  traMADol (ULTRAM) 50 MG tablet Take 50 mg by mouth daily as needed (pain).   Yes [provider]    Physical Exam: Vitals:   08/17/19 1106 08/17/19 1130 08/17/19 1230 08/17/19 1242  BP: 116/71 (!) 143/79 (!) 146/61   Pulse: 67 69 (!) 45   Resp: (!) 21 (!) 26 (!) 22   Temp:    (S) (!) 101 F (38.3 C)  TempSrc:    Rectal  SpO2:  97% 98% (!) 88%     Constitutional: NAD, calm, comfortable, alert, communicating well, on 3 L of oxygen via nasal cannula Eyes: PERRL, lids and conjunctivae normal ENMT: Mucous membranes are moist. Posterior pharynx clear of any exudate or lesions.Normal dentition.  Neck: normal, supple, no masses, no thyromegaly Respiratory: clear to auscultation bilaterally, no wheezing, no crackles. Normal respiratory effort. No accessory muscle use.  Cardiovascular: Regular rate and rhythm, no murmurs / rubs / gallops.  Bilateral 2+ pitting edema positive. 2+ pedal pulses. No carotid bruits.  Abdomen: no tenderness, no masses palpated. No hepatosplenomegaly. Bowel sounds positive.  Musculoskeletal: Left knee: Swollen & warm as compared to right knee.  Tender on palpation.  Mildly erythematous. Skin: Chronic venous stasis noted  in bilateral lower extremities. Neurologic: CN 2-12 grossly intact. Sensation intact, DTR normal. Strength 5/5 in all 4.  Psychiatric: Normal judgment and insight. Alert and oriented x 3. Normal mood.    Labs on Admission: I have personally reviewed following labs and imaging studies  CBC: Recent Labs  Lab 08/17/19 0929 08/17/19 1207  WBC 9.8  --   NEUTROABS 8.0*  --   HGB 13.5 12.2*  HCT 41.4 36.0*  MCV 96.5  --   PLT 99*  --    Basic Metabolic Panel: Recent Labs  Lab 08/17/19 0929 08/17/19 1207  NA 139 136  K 4.3 3.9  CL 102  --   CO2 22  --   GLUCOSE 109*  --   BUN 39*  --   CREATININE 2.00*  --   CALCIUM 9.4  --    GFR: CrCl cannot be calculated (Unknown ideal weight.). Liver Function Tests: Recent Labs  Lab 08/17/19 0929  AST 32  ALT 21  ALKPHOS 70  BILITOT 2.3*  PROT 6.5  ALBUMIN 3.2*   No results for input(s): LIPASE, AMYLASE in the last 168 hours. No results for input(s): AMMONIA in the last 168 hours. Coagulation Profile: No results for input(s): INR, PROTIME in the last 168 hours. Cardiac Enzymes: No results for input(s): CKTOTAL,  CKMB, CKMBINDEX, TROPONINI in the last 168 hours. BNP (last 3 results) No results for input(s): PROBNP in the last 8760 hours. HbA1C: No results for input(s): HGBA1C in the last 72 hours. CBG: No results for input(s): GLUCAP in the last 168 hours. Lipid Profile: No results for input(s): CHOL, HDL, LDLCALC, TRIG, CHOLHDL, LDLDIRECT in the last 72 hours. Thyroid Function Tests: No results for input(s): TSH, T4TOTAL, FREET4, T3FREE, THYROIDAB in the last 72 hours. Anemia Panel: No results for input(s): VITAMINB12, FOLATE, FERRITIN, TIBC, IRON, RETICCTPCT in the last 72 hours. Urine analysis:    Component Value Date/Time   COLORURINE YELLOW 08/17/2019 0929   APPEARANCEUR CLEAR 08/17/2019 0929   LABSPEC 1.019 08/17/2019 0929   PHURINE 5.0 08/17/2019 0929   GLUCOSEU NEGATIVE 08/17/2019 0929   HGBUR SMALL (A) 08/17/2019 0929   BILIRUBINUR NEGATIVE 08/17/2019 0929   KETONESUR 5 (A) 08/17/2019 0929   PROTEINUR 30 (A) 08/17/2019 0929   UROBILINOGEN 0.2 04/03/2009 1215   NITRITE NEGATIVE 08/17/2019 0929   LEUKOCYTESUR NEGATIVE 08/17/2019 0929    Radiological Exams on Admission: DG Chest 2 View  Result Date: 08/17/2019 CLINICAL DATA:  Weakness. EXAM: CHEST - 2 VIEW COMPARISON:  08/28/2014 FINDINGS: Single lead pacer device in situ enters via left subclavian approach. Power pack over left chest. Changes of median sternotomy with CABG. Cardiomediastinal contours are stable. Linear scarring or atelectasis in the right mid chest. Right hemidiaphragmatic elevation similar to prior study. Vague airspace and interstitial opacities overlying the right hemidiaphragm. No sign of pleural effusion. Visualized skeletal structures are unremarkable. IMPRESSION: 1. Interstitial and airspace opacities over the right hemidiaphragm likely juxta diaphragmatic atelectasis. Difficult to exclude developing infection. 2. Stable right hemidiaphragmatic elevation. Electronically Signed   By: Zetta Bills M.D.   On:  08/17/2019 09:57   DG Shoulder Right  Result Date: 08/17/2019 CLINICAL DATA:  Right shoulder pain EXAM: RIGHT SHOULDER - 2+ VIEW COMPARISON:  None. FINDINGS: Frontal and transscapular views of the right shoulder are obtained. No displaced fracture. Alignment is anatomic. Mild hypertrophic changes of the acromioclavicular joint. Chronic scarring throughout the right lung. IMPRESSION: 1. Mild acromioclavicular joint arthritis.  No acute fracture. Electronically Signed  By: Randa Ngo M.D.   On: 08/17/2019 00:54   DG Knee Complete 4 Views Left  Result Date: 08/17/2019 CLINICAL DATA:  Knee pain EXAM: LEFT KNEE - COMPLETE 4+ VIEW COMPARISON:  01/24/2018 FINDINGS: Vascular calcifications. Faint joint space calcification. No fracture or malalignment. Minimal patellofemoral degenerative change. Small effusion IMPRESSION: 1. No acute osseous abnormality 2. Trace effusion 3. Chondrocalcinosis.  Minimal patellofemoral degenerative change Electronically Signed   By: Donavan Foil M.D.   On: 08/17/2019 00:55    EKG: Independently reviewed.   Assessment/Plan Active Problems:   Atrial fibrillation (HCC)   CAD (coronary artery disease)   HTN (hypertension)   Pacemaker   AMS (altered mental status)   Thrombocytopenia (HCC)   Acute on chronic kidney failure (HCC)   Joint pain   Acute hypoxemic respiratory failure (HCC)   Left knee pain and swelling: -Could be due to septic arthritis versus gouty flare? -Reviewed x-ray of left knee. -Upon arrival: Patient had fever of 101, tachypneic. -Received IV Vanco and cefepime in the ED. -UA is negative.  Blood culture, lactic acid, COVID-19 pending. -Consulted orthopedic surgery for joint aspiration -Dilaudid as needed for pain control -Consulted PT  Right shoulder pain: -Secondary to chronic arthritic changes.  Reviewed x-ray of right shoulder. -Tylenol/Dilaudid as needed for pain control -Consulted PT/OT  Altered mental status/confusion: -Could be  due to underlying infection.  No focal deficit noted on exam -Reviewed chest x-ray.  UA is negative.  Blood culture, lactic acid: Pending. -Check ethanol, ammonia, TSH, UDS. -Consulted PT/OT/SLP -We will keep him n.p.o. until he passes the bedside swallow evaluation.  Acute hypoxemic respiratory failure: -Patient is currently requiring 3 L of oxygen via nasal cannula. -Reviewed chest x-ray. -COVID-19, blood culture is pending.  Ordered procalcitonin level. -We will continue IV Vanco and cefepime for now. -On continuous pulse ox.  Wean off of oxygen as tolerated.  Acute on chronic kidney failure 3B: -Creatinine: 2.0, GFR: 28 (baseline creatinine 1.43, GFR: 43) -Hold nephrotoxic medication Lasix and losartan.  Monitor kidney function closely. -Repeat BMP tomorrow a.m.  Coronary artery disease status post CABG in 1999 -Stable.  No ACS symptoms -Continue Imdur  Hyperlipidemia: Stable -Continue statin  Carotid artery disease: Status post endarterectomy  Hypertension: Stable.  Continue amlodipine -Hold losartan for now due to AKI. -Monitor blood pressure closely  Persistent A. fib:  -Continue Eliquis  Thrombocytopenia: Platelet: 99 -No signs of active bleeding. -Monitor closely  Complete heart block status post pacemaker: -On telemetry.   DVT prophylaxis: Eliquis/TED/SCD Code Status: Full code-confirmed with the patient. Family Communication: None present at bedside.  Plan of care discussed with patient in length and he verbalized understanding and agreed with it. Discussed plan of care with patient's daughter Jesus Beck over the phone and she verbalized understanding. Disposition Plan: home in 1-2 days Consults called: none Admission status: inpatient.   Mckinley Jewel MD [facility] Pager [phone]  If 7PM-7AM, please contact night-coverage [email or web address] Password [facility]  08/17/2019, 1:38 PM

## 2019-08-17 NOTE — Plan of Care (Signed)

## 2019-08-17 NOTE — Procedures (Signed)
Procedure: Left knee aspiration and injection  Indication: Left knee effusion(s)  Surgeon: Silvestre Gunner, PA-C  Assist: None  Anesthesia: Topical refrigerant  EBL: None  Complications: None  Findings: After risks/benefits explained patient desires to undergo procedure. Consent obtained and time out performed. The left knee was sterilely prepped and aspirated. 84ml turbid yellow fluid obtained. 22ml 0.5% Marcaine instilled. Pt tolerated the procedure well.    Lisette Abu, PA-C Orthopedic Surgery [phone]

## 2019-08-18 DIAGNOSIS — I10 Essential (primary) hypertension: Secondary | ICD-10-CM

## 2019-08-18 DIAGNOSIS — M109 Gout, unspecified: Secondary | ICD-10-CM

## 2019-08-18 DIAGNOSIS — Z950 Presence of cardiac pacemaker: Secondary | ICD-10-CM

## 2019-08-18 DIAGNOSIS — I25119 Atherosclerotic heart disease of native coronary artery with unspecified angina pectoris: Secondary | ICD-10-CM

## 2019-08-18 DIAGNOSIS — M25562 Pain in left knee: Secondary | ICD-10-CM

## 2019-08-18 DIAGNOSIS — N179 Acute kidney failure, unspecified: Secondary | ICD-10-CM

## 2019-08-18 DIAGNOSIS — N1832 Chronic kidney disease, stage 3b: Secondary | ICD-10-CM

## 2019-08-18 DIAGNOSIS — D696 Thrombocytopenia, unspecified: Secondary | ICD-10-CM

## 2019-08-18 DIAGNOSIS — J9601 Acute respiratory failure with hypoxia: Secondary | ICD-10-CM

## 2019-08-18 DIAGNOSIS — I480 Paroxysmal atrial fibrillation: Secondary | ICD-10-CM

## 2019-08-18 DIAGNOSIS — R4182 Altered mental status, unspecified: Secondary | ICD-10-CM

## 2019-08-18 LAB — BASIC METABOLIC PANEL
Anion gap: 14 (ref 5–15)
BUN: 33 mg/dL — ABNORMAL HIGH (ref 8–23)
CO2: 18 mmol/L — ABNORMAL LOW (ref 22–32)
Calcium: 9.1 mg/dL (ref 8.9–10.3)
Chloride: 107 mmol/L (ref 98–111)
Creatinine, Ser: 1.64 mg/dL — ABNORMAL HIGH (ref 0.61–1.24)
GFR calc Af Amer: 42 mL/min — ABNORMAL LOW (ref >60)
GFR calc non Af Amer: 36 mL/min — ABNORMAL LOW (ref >60)
Glucose, Bld: 102 mg/dL — ABNORMAL HIGH (ref 70–99)
Potassium: 4.1 mmol/L (ref 3.5–5.1)
Sodium: 139 mmol/L (ref 135–145)

## 2019-08-18 LAB — MRSA PCR SCREENING: MRSA by PCR: NEGATIVE

## 2019-08-18 LAB — CBC
HCT: 38.4 % — ABNORMAL LOW (ref 39.0–52.0)
Hemoglobin: 12.8 g/dL — ABNORMAL LOW (ref 13.0–17.0)
MCH: 31.8 pg (ref 26.0–34.0)
MCHC: 33.3 g/dL (ref 30.0–36.0)
MCV: 95.3 fL (ref 80.0–100.0)
Platelets: 120 10*3/uL — ABNORMAL LOW (ref 150–400)
RBC: 4.03 MIL/uL — ABNORMAL LOW (ref 4.22–5.81)
RDW: 13.3 % (ref 11.5–15.5)
WBC: 10.7 10*3/uL — ABNORMAL HIGH (ref 4.0–10.5)
nRBC: 0 % (ref 0.0–0.2)

## 2019-08-18 LAB — URIC ACID: Uric Acid, Serum: 8.1 mg/dL (ref 3.7–8.6)

## 2019-08-18 MED ORDER — PREDNISONE 20 MG PO TABS
40.0000 mg | ORAL_TABLET | Freq: Every day | ORAL | Status: DC
  Administered 2019-08-18 – 2019-08-20 (×3): 40 mg via ORAL
  Filled 2019-08-18 (×3): qty 2

## 2019-08-18 NOTE — Plan of Care (Signed)

## 2019-08-18 NOTE — Evaluation (Signed)
Physical Therapy Evaluation Patient Details Name: Jesus Beck MRN: [id number] DOB: [birth year] Today's Date: 08/18/2019   History of Present Illness  Jesus Beck is a 86 y.o. male with medical history significant of coronary artery disease status post CABG in 1999, A. fib on Eliquis, hypertension, hyperlipidemia, CKD stage III, heart block status post pacemaker, gout presents to emergency department due to right shoulder pain, left knee pain and acute confusion. S/p left knee aspiration 08/17/2019; working diagnosis of septic arthritis versus gout flare up. L knee and R shoulder x-ray negative for acute abnormality.   Clinical Impression  Prior to admission, pt lives with his daughter and son in law, is ambulatory without an assistive device, and independent with ADL's. Pt now reporting right knee pain following left knee aspiration. Requiring moderate assist to stand from elevated bed height, ambulating 5 feet with a walker at a min guard assist level. SpO2 90-93% on 2L O2. Suspect steady progress with pain control/management. Discussed pt level of mobility with pt daughter via phone. She states they will have adequate assist between herself, her husband, and sons. She does not want him to d/c to SNF. I relayed my concern regarding his negotiating 17 steps to bedroom. She states a hospital bed cannot fit on the first floor and they are looking into a stair lift. Will continue to follow to progress mobility.     Follow Up Recommendations Home health PT;Supervision/Assistance - 24 hour    Equipment Recommendations  None recommended by PT    Recommendations for Other Services       Precautions / Restrictions Precautions Precautions: Fall Restrictions Weight Bearing Restrictions: No      Mobility  Bed Mobility Overal bed mobility: Needs Assistance Bed Mobility: Supine to Sit     Supine to sit: Supervision     General bed mobility comments: No physical assist, use of bed rail.  Increased time/effort  Transfers Overall transfer level: Needs assistance Equipment used: Rolling walker (2 wheeled) Transfers: Sit to/from Stand Sit to Stand: Mod assist         General transfer comment: ModA to stand from elevated bed height  Ambulation/Gait Ambulation/Gait assistance: Min guard Gait Distance (Feet): 5 Feet Assistive device: Rolling walker (2 wheeled) Gait Pattern/deviations: Step-through pattern;Decreased stride length;Trunk flexed Gait velocity: decreased   General Gait Details: Min guard for stability, cues for direction/environmental negotiation. Decreased proximity to walker  Stairs            Wheelchair Mobility    Modified Rankin (Stroke Patients Only)       Balance Overall balance assessment: Needs assistance Sitting-balance support: Feet supported Sitting balance-Leahy Scale: Good     Standing balance support: Bilateral upper extremity supported Standing balance-Leahy Scale: Poor                               Pertinent Vitals/Pain Pain Assessment: Faces Faces Pain Scale: Hurts little more Pain Location: R knee Pain Descriptors / Indicators: Discomfort;Guarding Pain Intervention(s): Limited activity within patient's tolerance;Monitored during session    Home Living Family/patient expects to be discharged to:: Private residence Living Arrangements: Children(daughter) Available Help at Discharge: Family;Available 24 hours/day Type of Home: House Home Access: Stairs to enter   [employer] of Steps: 3 Home Layout: Two level Home Equipment: Cane - single point;Bedside commode;Walker - 4 wheels      Prior Function Level of Independence: Independent  Comments: Does not drive. Reports 2 falls. Independent with ADL's/IADL's including medication management, able to prepare a simple meal if he needs to     Hand Dominance        Extremity/Trunk Assessment   Upper Extremity Assessment Upper  Extremity Assessment: Generalized weakness    Lower Extremity Assessment Lower Extremity Assessment: Generalized weakness    Cervical / Trunk Assessment Cervical / Trunk Assessment: Kyphotic  Communication   Communication: HOH  Cognition Arousal/Alertness: Awake/alert Behavior During Therapy: WFL for tasks assessed/performed Overall Cognitive Status: Difficult to assess                                 General Comments: WFL for basic mobility, answering questions appropriately      General Comments      Exercises     Assessment/Plan    PT Assessment Patient needs continued PT services  PT Problem List Decreased strength;Decreased activity tolerance;Decreased balance;Decreased mobility;Pain       PT Treatment Interventions DME instruction;Gait training;Stair training;Functional mobility training;Therapeutic activities;Therapeutic exercise;Balance training;Patient/family education    PT Goals (Current goals can be found in the Care Plan section)  Acute Rehab PT Goals Patient Stated Goal: pt daughter would like him to go home PT Goal Formulation: With patient/family Time For Goal Achievement: 09/01/19 Potential to Achieve Goals: Good    Frequency Min 3X/week   Barriers to discharge        Co-evaluation               AM-PAC PT "6 Clicks" Mobility  Outcome Measure Help needed turning from your back to your side while in a flat bed without using bedrails?: None Help needed moving from lying on your back to sitting on the side of a flat bed without using bedrails?: None Help needed moving to and from a bed to a chair (including a wheelchair)?: A Little Help needed standing up from a chair using your arms (e.g., wheelchair or bedside chair)?: A Lot Help needed to walk in hospital room?: A Little Help needed climbing 3-5 steps with a railing? : A Lot 6 Click Score: 18    End of Session Equipment Utilized During Treatment: Gait belt Activity  Tolerance: Patient tolerated treatment well Patient left: in chair;with call bell/phone within reach;with chair alarm set Nurse Communication: Mobility status PT Visit Diagnosis: Unsteadiness on feet (R26.81);Muscle weakness (generalized) (M62.81);Difficulty in walking, not elsewhere classified (R26.2);Pain Pain - Right/Left: (bilateral) Pain - part of body: Knee    Time: 0755-0820 PT Time Calculation (min) (ACUTE ONLY): 25 min   Charges:   PT Evaluation $PT Eval Moderate Complexity: 1 Mod PT Treatments $Therapeutic Activity: 8-22 mins          Wyona Almas, PT, DPT Acute Rehabilitation Services Pager [phone] Office [phone]   Deno Etienne 08/18/2019, 9:06 AM

## 2019-08-18 NOTE — Progress Notes (Signed)
PROGRESS NOTE    Jesus Beck  [id number] DOB: May 15, 1925 DOA: 08/17/2019 PCP: Seward Carol, MD    Brief Narrative:  Jesus Beck is a 86 y.o. male with medical history significant of coronary artery disease status post CABG in 1999, A. fib on Eliquis, hypertension, hyperlipidemia, CKD stage III, heart block status post pacemaker, gout presents to emergency department due to right shoulder pain and left knee pain since 4 days.  As per daughter Caren Griffins over the phone: Patient has been confused since last night.  She reports that he wanted to go to the store to buy strawberries last night and also iron clothes late at night.  Reports that he was not like himself.  Has generalized weakness, dental pain and decreased appetite since 2 days.  Reports diarrhea started Sunday however resolved on its own.  Has history of gout in left knee, ankle and foot.  Takes colchicine and tramadol at home.  Reports left knee swelling and pain which is progressive.  Unable to walk due to left knee pain.    No history of headache, blurry vision, loss of consciousness, head trauma, slurred speech, facial droop, fever, chills, nausea, vomiting, stomachache, urinary or sleep changes.  Patient lives with his family at home.  No history of smoking, alcohol, street drug use.  He is independent on daily life activities.  No recent medication changes.  Covid vaccine: Up-to-date.  Upon arrival to ED, patient had fever of 101, tachypneic, no leukocytosis, platelet: 99, CMP shows AKI on CKD stage III, UA negative for infection.  Blood cultures, lactic acid and COVID-19 pending.  Hypoxic upon arrival.  Requiring 3 L of oxygen via nasal cannula.  X-ray of left knee shows trace effusion, chondrocalcinosis.  Minimal patellofemoral degenerative change.  X-ray of right shoulder shows mild acromioclavicular joint arthritis.  Chest x-ray shows interstitial and airspace opacities over the right hemidiaphragm.  Difficult to exclude  a developing infection.  Patient received broad-spectrum antibiotics vancomycin and cefepime in ED.  [facility] consulted for admission for septic arthritis versus gouty flare.   Assessment & Plan:   Active Problems:   Atrial fibrillation (HCC)   CAD (coronary artery disease)   HTN (hypertension)   Pacemaker   AMS (altered mental status)   Thrombocytopenia (HCC)   Acute on chronic kidney failure (HCC)   Joint pain   Acute hypoxemic respiratory failure (HCC)   Left knee pain likely secondary to acute gouty arthritis Patient presenting with acute versus subacute onset left knee pain with difficulty ambulating associated with erythema and swelling.  History of gout.  Fever of 101.0 on admission.  Left knee x-ray no acute osseous abnormality, trace effusion with chondrocalcinosis and minimal patellofemoral degenerative change.  Underwent left knee arthrocentesis by orthopedics on 08/17/2019 with findings of WBC count 12K, neutrophils 92, and notable for monourate crystals consistent with gout.  Uric acid 8.1. --Left knee body fluid culture: Pending --Start prednisone 40 mg p.o. daily; avoiding NSAIDs given his renal insufficiency --Continue PT/OT efforts  Right shoulder pain Right shoulder x-ray notable for AC joint osteoarthritis, no appreciable fractures. --Continue pain control and therapy efforts  Acute metabolic encephalopathy Unclear etiology, suspect possibly from infectious/pneumonia.  Urinalysis, UDS, TSH, ammonia level, lactic acid all unremarkable/negative. --Appears to be back at his normal baseline this morning.  Acute hypoxemic respiratory failure Pneumonia, community-acquired Chest x-ray with interstitial airspace opacity right hemidiaphragm, concerning for developing infection versus diaphragmatic atelectasis.  Procalcitonin 0.11, WBC count 10.7.  Fever 101.0 on  admission. --MRSA PCR: Negative, will discontinue vancomycin --Continue cefepime for now; will consider  further de-escalation next 24 hours --Continue supplemental oxygen, wean to maintain SPO2 greater than 92% --Incentive spirometry, flutter valve  Acute on chronic kidney disease stage IIIb Creatinine 2.0, GFR 28 on admission, baseline creatinine 1.43, GFR 43. --Cr. 2.0-->1.64 --Continue to hold home Lasix 20mg  PO daily and losartan 100mg  PO daily --Avoid nephrotoxins, renally dose all medications --Repeat BMP in a.m.  Coronary artery disease status post CABG 1999 Stable.  No ACS symptoms. --Continue Imdur 90 mg p.o. daily  HLD: Continue simvastatin 20 mg nightly  Carotid artery disease: Status post endarterectomy  Essential hypertension:  --Holding home losartan 100mg  PO daily secondary to AKI --Continue amlodipine p.o. daily --Blood pressures closely  Persistent A. fib:  --Continue Eliquis 2.5 mg p.o. twice daily  Thrombocytopenia:  Platelet: 99-->120, No signs of active bleeding.  Complete heart block status post pacemaker: --Continue to monitor on telemetry.   DVT prophylaxis: Eliquis, SCDs Code Status: Full code Family Communication: Attempted to contact patient's daughter, Caren Griffins via telephone, unsuccessful  Disposition Plan:  Status is: Inpatient  Remains inpatient appropriate because:Ongoing active pain requiring inpatient pain management, Altered mental status, Ongoing diagnostic testing needed not appropriate for outpatient work up, Unsafe d/c plan, IV treatments appropriate due to intensity of illness or inability to take PO and Inpatient level of care appropriate due to severity of illness   Dispo: The patient is from: Home              Anticipated d/c is to: Home              Anticipated d/c date is: 3 days              Patient currently is not medically stable to d/c.   Consultants:   Orthopedics - [facility]or, Dr. Griffin Basil  Procedures:   Left knee arthrocentesis 08/17/2019  Antimicrobials:   Vancomycin 4/15 - 4/16  Cefepime  4/15>>   Subjective: Patient seen and examined bedside, resting comfortably in bedside chair.  Continues with left knee discomfort, states slightly improved today.  Continues on supplemental oxygen, not on home O2 per his report.  No other complaints this morning.  Denies headache, no chest pain, no palpitations, no shortness of breath, no cough/congestion, no abdominal pain, no weakness, no fatigue.  No acute events overnight per nursing staff.  Objective: Vitals:   08/17/19 1836 08/17/19 1934 08/18/19 0446 08/18/19 0754  BP: 140/69 (!) 147/76 (!) 146/60 (!) 155/61  Pulse: 68 63 60 62  Resp: 20 20 16 18   Temp: 97.8 F (36.6 C) 100.2 F (37.9 C) (!) 97.4 F (36.3 C) 98.6 F (37 C)  TempSrc: Oral Oral Oral Oral  SpO2: 95% 91% 94% 97%  Weight:      Height:        Intake/Output Summary (Last 24 hours) at 08/18/2019 1156 Last data filed at 08/18/2019 1049 Gross per 24 hour  Intake 600 ml  Output 201 ml  Net 399 ml   Filed Weights   08/17/19 1300  Weight: 67.6 kg    Examination:  General exam: Appears calm and comfortable, elderly in appearance Respiratory system: Breath sounds slightly decreased bilateral bases, Respiratory effort normal, on 3 L nasal cannula oxygenating 94% (not on home O2) Cardiovascular system: S1 & S2 heard, RRR. No JVD, murmurs, rubs, gallops or clicks. No pedal edema. Gastrointestinal system: Abdomen is nondistended, soft and nontender. No organomegaly or masses felt. Normal bowel  sounds heard. Central nervous system: Alert and oriented. No focal neurological deficits. Extremities: Left knee slightly edematous in appearance versus right, mild erythema, slightly tender to palpation, normal active/passive range of motion Skin: chronic venous stasis bilateral lower extremities Psychiatry: Judgement and insight appear poor. Mood & affect appropriate.     Data Reviewed: I have personally reviewed following labs and imaging studies  CBC: Recent Labs  Lab  08/17/19 0929 08/17/19 1207 08/18/19 0436  WBC 9.8  --  10.7*  NEUTROABS 8.0*  --   --   HGB 13.5 12.2* 12.8*  HCT 41.4 36.0* 38.4*  MCV 96.5  --  95.3  PLT 99*  --  123456*   Basic Metabolic Panel: Recent Labs  Lab 08/17/19 0929 08/17/19 1207 08/17/19 1337 08/18/19 0436  NA 139 136  --  139  K 4.3 3.9  --  4.1  CL 102  --   --  107  CO2 22  --   --  18*  GLUCOSE 109*  --   --  102*  BUN 39*  --   --  33*  CREATININE 2.00*  --   --  1.64*  CALCIUM 9.4  --   --  9.1  MG  --   --  1.5*  --   PHOS  --   --  2.7  --    GFR: Estimated Creatinine Clearance: 28.1 mL/min (A) (by C-G formula based on SCr of 1.64 mg/dL (H)). Liver Function Tests: Recent Labs  Lab 08/17/19 0929  AST 32  ALT 21  ALKPHOS 70  BILITOT 2.3*  PROT 6.5  ALBUMIN 3.2*   No results for input(s): LIPASE, AMYLASE in the last 168 hours. Recent Labs  Lab 08/17/19 1500  AMMONIA 13   Coagulation Profile: No results for input(s): INR, PROTIME in the last 168 hours. Cardiac Enzymes: No results for input(s): CKTOTAL, CKMB, CKMBINDEX, TROPONINI in the last 168 hours. BNP (last 3 results) No results for input(s): PROBNP in the last 8760 hours. HbA1C: No results for input(s): HGBA1C in the last 72 hours. CBG: No results for input(s): GLUCAP in the last 168 hours. Lipid Profile: No results for input(s): CHOL, HDL, LDLCALC, TRIG, CHOLHDL, LDLDIRECT in the last 72 hours. Thyroid Function Tests: Recent Labs    08/17/19 1337  TSH 1.028   Anemia Panel: No results for input(s): VITAMINB12, FOLATE, FERRITIN, TIBC, IRON, RETICCTPCT in the last 72 hours. Sepsis Labs: Recent Labs  Lab 08/17/19 1337 08/17/19 1350  PROCALCITON 0.11  --   LATICACIDVEN  --  1.4    Recent Results (from the past 240 hour(s))  SARS CORONAVIRUS 2 (TAT 6-24 HRS) Nasopharyngeal Nasopharyngeal Swab     Status: None   Collection Time: 08/17/19 11:12 AM   Specimen: Nasopharyngeal Swab  Result Value Ref Range Status   SARS  Coronavirus 2 NEGATIVE NEGATIVE Final    Comment: (NOTE) SARS-CoV-2 target nucleic acids are NOT DETECTED. The SARS-CoV-2 RNA is generally detectable in upper and lower respiratory specimens during the acute phase of infection. Negative results do not preclude SARS-CoV-2 infection, do not rule out co-infections with other pathogens, and should not be used as the sole basis for treatment or other patient management decisions. Negative results must be combined with clinical observations, patient history, and epidemiological information. The expected result is Negative. Fact Sheet for Patients: SugarRoll.be Fact Sheet for Healthcare Providers: https://www.woods-mathews.com/ This test is not yet approved or cleared by the [place] and  has been authorized  for detection and/or diagnosis of SARS-CoV-2 by FDA under an Emergency Use Authorization (EUA). This EUA will remain  in effect (meaning this test can be used) for the duration of the COVID-19 declaration under Section 56 4(b)(1) of the Act, 21 U.S.C. section 360bbb-3(b)(1), unless the authorization is terminated or revoked sooner. Performed at [facility], [place] [address] [place], [place] [address]   Body fluid culture     Status: None (Preliminary result)   Collection Time: 08/17/19  2:52 PM   Specimen: Synovium; Body Fluid  Result Value Ref Range Status   Specimen Description SYNOVIAL FLUID LEFT KNEE  Final   Special Requests NONE  Final   Gram Stain   Final    ABUNDANT WBC PRESENT, PREDOMINANTLY PMN NO ORGANISMS SEEN    Culture   Final    NO GROWTH < 24 HOURS Performed at [facility], [address], [place], [place] [address]    Report Status PENDING  Incomplete  MRSA PCR Screening     Status: None   Collection Time: 08/18/19  9:12 AM   Specimen: Nasopharyngeal  Result Value Ref Range Status   MRSA by PCR NEGATIVE NEGATIVE Final    Comment:        The  GeneXpert MRSA Assay (FDA approved for NASAL specimens only), is one component of a comprehensive MRSA colonization surveillance program. It is not intended to diagnose MRSA infection nor to guide or monitor treatment for MRSA infections. Performed at [facility], [place] [address] [place], [place] [address]          Radiology Studies: DG Chest 2 View  Result Date: 08/17/2019 CLINICAL DATA:  Weakness. EXAM: CHEST - 2 VIEW COMPARISON:  08/28/2014 FINDINGS: Single lead pacer device in situ enters via left subclavian approach. Power pack over left chest. Changes of median sternotomy with CABG. Cardiomediastinal contours are stable. Linear scarring or atelectasis in the right mid chest. Right hemidiaphragmatic elevation similar to prior study. Vague airspace and interstitial opacities overlying the right hemidiaphragm. No sign of pleural effusion. Visualized skeletal structures are unremarkable. IMPRESSION: 1. Interstitial and airspace opacities over the right hemidiaphragm likely juxta diaphragmatic atelectasis. Difficult to exclude developing infection. 2. Stable right hemidiaphragmatic elevation. Electronically Signed   By: Zetta Bills M.D.   On: 08/17/2019 09:57   DG Shoulder Right  Result Date: 08/17/2019 CLINICAL DATA:  Right shoulder pain EXAM: RIGHT SHOULDER - 2+ VIEW COMPARISON:  None. FINDINGS: Frontal and transscapular views of the right shoulder are obtained. No displaced fracture. Alignment is anatomic. Mild hypertrophic changes of the acromioclavicular joint. Chronic scarring throughout the right lung. IMPRESSION: 1. Mild acromioclavicular joint arthritis.  No acute fracture. Electronically Signed   By: Randa Ngo M.D.   On: 08/17/2019 00:54   DG Knee Complete 4 Views Left  Result Date: 08/17/2019 CLINICAL DATA:  Knee pain EXAM: LEFT KNEE - COMPLETE 4+ VIEW COMPARISON:  01/24/2018 FINDINGS: Vascular calcifications. Faint joint space calcification. No fracture or  malalignment. Minimal patellofemoral degenerative change. Small effusion IMPRESSION: 1. No acute osseous abnormality 2. Trace effusion 3. Chondrocalcinosis.  Minimal patellofemoral degenerative change Electronically Signed   By: Donavan Foil M.D.   On: 08/17/2019 00:55        Scheduled Meds: . amLODipine  5 mg Oral Daily  . apixaban  2.5 mg Oral BID  . isosorbide mononitrate  90 mg Oral Daily  . predniSONE  40 mg Oral Q breakfast  . simvastatin  20 mg  Oral QHS   Continuous Infusions: . ceFEPime (MAXIPIME) IV    . [START ON 08/19/2019] vancomycin       LOS: 1 day    Time spent: 43 minutes spent on chart review, discussion with nursing staff, consultants, updating family and interview/physical exam; more than 50% of that time was spent in counseling and/or coordination of care.    Angeldejesus Callaham J [place] DO [facility] Available via Epic secure chat 7am-7pm After these hours, please refer to coverage provider listed on [email or web address] 08/18/2019, 11:56 AM

## 2019-08-18 NOTE — Evaluation (Signed)
Occupational Therapy Evaluation Patient Details Name: Jesus Beck MRN: [id number] DOB: [birth year] Today's Date: 08/18/2019    History of Present Illness Jesus Beck is a 86 y.o. male with medical history significant of coronary artery disease status post CABG in 1999, A. fib on Eliquis, hypertension, hyperlipidemia, CKD stage III, heart block status post pacemaker, gout presents to emergency department due to right shoulder pain, left knee pain and acute confusion. S/p left knee aspiration 08/17/2019; working diagnosis of septic arthritis versus gout flare up. L knee and R shoulder x-ray negative for acute abnormality.    Clinical Impression   PTA, pt was living with his daughter, Caren Griffins, and performed BADLs, simple IADLs, and used a cane for mobility. Pt currently requiring Mod A for LB ADLs and functional mobility with RW. Pt reporting he has never used a RW and requiring education on RW management and positioning. Pt presenting with decreased strength and balance increasing his risk for falls. SpO2 90s on RA throughout. Presenting with poor activity tolerance as seen by fatigue and shortness of breath. Pt would benefit from further acute OT to facilitate safe dc. Recommend dc to home with HHOT for further OT to optimize safety, independence with ADLs, and return to PLOF. Additionally, feel pt would benefit from wheelchair to optimize safety with long distance mobility due to fall risk and poor activity tolerance.     Follow Up Recommendations  Home health OT;Supervision/Assistance - 24 hour    Equipment Recommendations  Wheelchair (measurements OT);Wheelchair cushion (measurements OT)    Recommendations for Other Services PT consult     Precautions / Restrictions Precautions Precautions: Fall Restrictions Weight Bearing Restrictions: No      Mobility Bed Mobility Overal bed mobility: Needs Assistance Bed Mobility: Supine to Sit     Supine to sit: Supervision;HOB elevated      General bed mobility comments: No physical assist, use of bed rail. Increased time/effort  Transfers Overall transfer level: Needs assistance Equipment used: Rolling walker (2 wheeled) Transfers: Sit to/from Stand Sit to Stand: Mod assist         General transfer comment: Heavy Mod A to power up into standing from elevated bed and then [facility]. Providing education on RW management, hand placement, feet placement, and weight shifting.    Balance Overall balance assessment: Needs assistance Sitting-balance support: Feet supported Sitting balance-Leahy Scale: Good     Standing balance support: Bilateral upper extremity supported;During functional activity Standing balance-Leahy Scale: Poor                             ADL either performed or assessed with clinical judgement   ADL Overall ADL's : Needs assistance/impaired Eating/Feeding: Set up;Sitting   Grooming: Wash/dry face;Sitting;Set up;Supervision/safety Grooming Details (indicate cue type and reason): SpO2 95% on RA Upper Body Bathing: Set up;Supervision/ safety;Sitting   Lower Body Bathing: Moderate assistance;Sit to/from stand   Upper Body Dressing : Supervision/safety;Set up;Sitting   Lower Body Dressing: Moderate assistance;Sit to/from stand   Toilet Transfer: Moderate assistance;Ambulation;RW(simulated to recliner) Toilet Transfer Details (indicate cue type and reason): Requiring Mod A to power up into standing. Cues for feet placement, hand placement, and weight shifting.          Functional mobility during ADLs: Moderate assistance;Rolling walker General ADL Comments: Pt presenting with decreased strength, balance, and activity tolerance. Pt fatigues quickly.      Vision Baseline Vision/History: Wears glasses Wears Glasses: At all times Patient Visual  Report: No change from baseline       Perception     Praxis      Pertinent Vitals/Pain Pain Assessment: Faces Faces Pain Scale: Hurts  little more Pain Location: R knee Pain Descriptors / Indicators: Discomfort;Guarding Pain Intervention(s): Monitored during session;Limited activity within patient's tolerance;Repositioned     Hand Dominance Right   Extremity/Trunk Assessment Upper Extremity Assessment Upper Extremity Assessment: Generalized weakness   Lower Extremity Assessment Lower Extremity Assessment: Generalized weakness   Cervical / Trunk Assessment Cervical / Trunk Assessment: Kyphotic   Communication Communication Communication: HOH   Cognition Arousal/Alertness: Awake/alert Behavior During Therapy: WFL for tasks assessed/performed Overall Cognitive Status: Within Functional Limits for tasks assessed                                     General Comments  Daughter present throughout. SpO2 90s on RA throughout.    Exercises     Shoulder Instructions      Home Living Family/patient expects to be discharged to:: Private residence Living Arrangements: Children(daughter) Available Help at Discharge: Family;Available 24 hours/day Type of Home: House Home Access: Stairs to enter [employer] of Steps: 3   Home Layout: Two level Alternate Level Stairs-Number of Steps: 17(to bedroom) Alternate Level Stairs-Rails: Left Bathroom Shower/Tub: Walk-in shower(sponge bathes)   Bathroom Toilet: Standard     Home Equipment: Cane - single point;Bedside commode;Walker - 4 wheels          Prior Functioning/Environment Level of Independence: Independent        Comments: Does not drive. Reports 2 falls. Independent with ADL's/IADL's including medication management, able to prepare a simple meal if he needs to        OT Problem List: Decreased strength;Decreased range of motion;Decreased activity tolerance;Impaired balance (sitting and/or standing);Decreased knowledge of use of DME or AE;Decreased knowledge of precautions;Pain      OT Treatment/Interventions: Self-care/ADL  training;Therapeutic exercise;Energy conservation;DME and/or AE instruction;Therapeutic activities;Patient/family education    OT Goals(Current goals can be found in the care plan section) Acute Rehab OT Goals Patient Stated Goal: pt daughter would like him to go home OT Goal Formulation: With patient Time For Goal Achievement: 09/01/19 Potential to Achieve Goals: Good  OT Frequency: Min 2X/week   Barriers to D/C:            Co-evaluation              AM-PAC OT "6 Clicks" Daily Activity     Outcome Measure Help from another person eating meals?: A Little Help from another person taking care of personal grooming?: A Little Help from another person toileting, which includes using toliet, bedpan, or urinal?: A Lot Help from another person bathing (including washing, rinsing, drying)?: A Lot Help from another person to put on and taking off regular upper body clothing?: A Little Help from another person to put on and taking off regular lower body clothing?: A Lot 6 Click Score: 15   End of Session Equipment Utilized During Treatment: Rolling walker;Gait belt Nurse Communication: Mobility status  Activity Tolerance: Patient tolerated treatment well Patient left: in chair;with call bell/phone within reach;with chair alarm set;with family/visitor present  OT Visit Diagnosis: Unsteadiness on feet (R26.81);Other abnormalities of gait and mobility (R26.89);Muscle weakness (generalized) (M62.81);Pain Pain - part of body: (Back)                Time: [id number] OT Time Calculation (min):  33 min Charges:  OT General Charges $OT Visit: 1 Visit OT Evaluation $OT Eval Moderate Complexity: 1 Mod OT Treatments $Self Care/Home Management : 8-22 mins  Jadi Deyarmin MSOT, OTR/L Acute Rehab Pager: [phone] Office: [place] 08/18/2019, 2:32 PM

## 2019-08-18 NOTE — Evaluation (Signed)
Clinical/Bedside Swallow Evaluation Patient Details  Name: Jesus Beck MRN: [id number] Date of Birth: July 10, 1925  Today's Date: 08/18/2019 Time: SLP Start Time (ACUTE ONLY): 0810 SLP Stop Time (ACUTE ONLY): 0840 SLP Time Calculation (min) (ACUTE ONLY): 30 min  Past Medical History:  Past Medical History:  Diagnosis Date  . Atrial fibrillation (HCC)    paroxysmal atrial fibrillation- stopped sotolol due to HR 44 (on 40mg  BID)  . Atrial fibrillation ([place])   . Back pain   . CAD (coronary artery disease)    s/p CABG 1999 with LIMA to MAD, SVG to first diagonal, SVG to RCA and PCI with DES mid LAD 06/09 and residual 70% D2 not amenable to PCI due to small caliber Cardiology Dr. Marlou Porch  . Carotid artery stenosis     s/p bialteral CEA's 2004 with persistent bruit on left and minimal plaque by carotid dopplers 2010  . Dyslipidemia   . HTN (hypertension)   . Hypercholesteremia   . Hypersomnia   . RBBB   . Renal insufficiency    Past Surgical History:  Past Surgical History:  Procedure Laterality Date  . CAROTID ENDARTERECTOMY Bilateral   . CHOLECYSTECTOMY    . CORONARY ARTERY BYPASS GRAFT    . EP IMPLANTABLE DEVICE N/A 08/27/2014   Procedure: PACEMAKER IMPLANT;  Surgeon: Belva Crome, MD;  Location: [facility] CATH LAB;  Service: Cardiovascular;  Laterality: N/A;  . HERNIA REPAIR    . INTRAOCULAR LENS INSERTION    . PERMANENT PACEMAKER INSERTION N/A 08/27/2014   Procedure: Daneil Dan;  Surgeon: Belva Crome, MD;  Location: [facility] CATH LAB;  Service: Cardiovascular;  Laterality: N/A;  . PERMANENT PACEMAKER INSERTION N/A 08/27/2014   Procedure: PERMANENT PACEMAKER INSERTION;  Surgeon: Evans Lance, MD;  Location: [facility] CATH LAB;  Service: Cardiovascular;  Laterality: N/A;   HPI:  Jesus Beck is a 86 y.o. male with medical history significant of coronary artery disease status post CABG in 1999, A. fib on Eliquis, hypertension, hyperlipidemia, CKD stage III, heart block status post pacemaker, gout  presents to emergency department due to right shoulder pain and left knee pain since 4 days. Underwent left knee effusion.    Assessment / Plan / Recommendation Clinical Impression  Pt demonstrates some signs of mild dysphagia, including sensitivity with mandibular opening and slightly wet vocal quality with thin liquids. Pt does not cough during session and vocal quality clears as pt takes a spontaneous dry swallow. He is able to feed himself and also able to masticate solids with his dentures in place despite left muscular mandibular pain. His mentation has cleared and he is appropriate. As long as pt is upright and able to self feed at a slow careful rate do not suspect any acute change from baseline and recommend pt continue a mech soft diet with thin liquids. If pt becomes significantly altered and cannot self feed or get out of bed, may need to revisit precautions as he would be at high risk of aspiration. Discussed with RN and daughter. Will sign off for now, but please reorder if needed.  SLP Visit Diagnosis: Dysphagia, oropharyngeal phase (R13.12)    Aspiration Risk  Moderate aspiration risk    Diet Recommendation Dysphagia 3 (Mech soft);Thin liquid   Liquid Administration via: Cup;Straw Medication Administration: Whole meds with liquid Supervision: Patient able to self feed Compensations: Slow rate;Small sips/bites;Minimize environmental distractions Postural Changes: Seated upright at 90 degrees    Other  Recommendations Oral Care Recommendations: Oral care BID  Follow up Recommendations None      Frequency and Duration            Prognosis        Swallow Study   General HPI: Jesus Beck is a 86 y.o. male with medical history significant of coronary artery disease status post CABG in 1999, A. fib on Eliquis, hypertension, hyperlipidemia, CKD stage III, heart block status post pacemaker, gout presents to emergency department due to right shoulder pain and left knee pain  since 4 days. Underwent left knee effusion.  Type of Study: Bedside Swallow Evaluation Diet Prior to this Study: Thin liquids;Dysphagia 3 (soft) Temperature Spikes Noted: Yes Respiratory Status: Room air History of Recent Intubation: No Behavior/Cognition: Alert;Cooperative;Pleasant mood Oral Cavity Assessment: Within Functional Limits Oral Care Completed by SLP: No Oral Cavity - Dentition: Missing dentition;Dentures, bottom;Dentures, top Vision: Functional for self-feeding Self-Feeding Abilities: Able to feed self Patient Positioning: Upright in chair Baseline Vocal Quality: Wet Volitional Cough: Strong Volitional Swallow: Able to elicit    Oral/Motor/Sensory Function Overall Oral Motor/Sensory Function: Within functional limits   Ice Chips     Thin Liquid Thin Liquid: Impaired Presentation: Cup;Straw;Self Fed Pharyngeal  Phase Impairments: Wet Vocal Quality    Nectar Thick Nectar Thick Liquid: Not tested   Honey Thick Honey Thick Liquid: Not tested   Puree Puree: Within functional limits Presentation: Self Fed;Spoon   Solid     Solid: Within functional limits Presentation: [place] Francis Yardley, MA [place] Pager [phone] Office [phone]  Lynann Beaver 08/18/2019,9:08 AM

## 2019-08-19 DIAGNOSIS — J189 Pneumonia, unspecified organism: Secondary | ICD-10-CM

## 2019-08-19 LAB — BASIC METABOLIC PANEL
Anion gap: 11 (ref 5–15)
BUN: 38 mg/dL — ABNORMAL HIGH (ref 8–23)
CO2: 23 mmol/L (ref 22–32)
Calcium: 9.5 mg/dL (ref 8.9–10.3)
Chloride: 103 mmol/L (ref 98–111)
Creatinine, Ser: 1.49 mg/dL — ABNORMAL HIGH (ref 0.61–1.24)
GFR calc Af Amer: 47 mL/min — ABNORMAL LOW (ref >60)
GFR calc non Af Amer: 40 mL/min — ABNORMAL LOW (ref >60)
Glucose, Bld: 134 mg/dL — ABNORMAL HIGH (ref 70–99)
Potassium: 4.2 mmol/L (ref 3.5–5.1)
Sodium: 137 mmol/L (ref 135–145)

## 2019-08-19 LAB — CBC
HCT: 40.2 % (ref 39.0–52.0)
Hemoglobin: 13.3 g/dL (ref 13.0–17.0)
MCH: 31.1 pg (ref 26.0–34.0)
MCHC: 33.1 g/dL (ref 30.0–36.0)
MCV: 93.9 fL (ref 80.0–100.0)
Platelets: 159 10*3/uL (ref 150–400)
RBC: 4.28 MIL/uL (ref 4.22–5.81)
RDW: 13.3 % (ref 11.5–15.5)
WBC: 14.1 10*3/uL — ABNORMAL HIGH (ref 4.0–10.5)
nRBC: 0 % (ref 0.0–0.2)

## 2019-08-19 LAB — MAGNESIUM: Magnesium: 1.7 mg/dL (ref 1.7–2.4)

## 2019-08-19 LAB — BRAIN NATRIURETIC PEPTIDE: B Natriuretic Peptide: 923.1 pg/mL — ABNORMAL HIGH (ref 0.0–100.0)

## 2019-08-19 MED ORDER — AMLODIPINE BESYLATE 10 MG PO TABS
10.0000 mg | ORAL_TABLET | Freq: Every day | ORAL | Status: DC
  Administered 2019-08-20 – 2019-08-22 (×3): 10 mg via ORAL
  Filled 2019-08-19 (×3): qty 1

## 2019-08-19 MED ORDER — AZITHROMYCIN 500 MG PO TABS
500.0000 mg | ORAL_TABLET | Freq: Every day | ORAL | Status: DC
  Administered 2019-08-19 – 2019-08-22 (×4): 500 mg via ORAL
  Filled 2019-08-19 (×4): qty 1

## 2019-08-19 MED ORDER — CEFDINIR 300 MG PO CAPS
300.0000 mg | ORAL_CAPSULE | Freq: Every day | ORAL | Status: DC
  Administered 2019-08-19 – 2019-08-21 (×3): 300 mg via ORAL
  Filled 2019-08-19 (×3): qty 1

## 2019-08-19 NOTE — Progress Notes (Signed)
PROGRESS NOTE    Jesus Beck  [id number] DOB: 08/25/1925 DOA: 08/17/2019 PCP: Seward Carol, MD    Brief Narrative:  Jesus Beck is a 86 y.o. male with medical history significant of coronary artery disease status post CABG in 1999, A. fib on Eliquis, hypertension, hyperlipidemia, CKD stage III, heart block status post pacemaker, gout presents to emergency department due to right shoulder pain and left knee pain since 4 days.  As per daughter Jesus Beck over the phone: Patient has been confused since last night.  She reports that he wanted to go to the store to buy strawberries last night and also iron clothes late at night.  Reports that he was not like himself.  Has generalized weakness, dental pain and decreased appetite since 2 days.  Reports diarrhea started Sunday however resolved on its own.  Has history of gout in left knee, ankle and foot.  Takes colchicine and tramadol at home.  Reports left knee swelling and pain which is progressive.  Unable to walk due to left knee pain.    No history of headache, blurry vision, loss of consciousness, head trauma, slurred speech, facial droop, fever, chills, nausea, vomiting, stomachache, urinary or sleep changes.  Patient lives with his family at home.  No history of smoking, alcohol, street drug use.  He is independent on daily life activities.  No recent medication changes.  Covid vaccine: Up-to-date.  Upon arrival to ED, patient had fever of 101, tachypneic, no leukocytosis, platelet: 99, CMP shows AKI on CKD stage III, UA negative for infection.  Blood cultures, lactic acid and COVID-19 pending.  Hypoxic upon arrival.  Requiring 3 L of oxygen via nasal cannula.  X-ray of left knee shows trace effusion, chondrocalcinosis.  Minimal patellofemoral degenerative change.  X-ray of right shoulder shows mild acromioclavicular joint arthritis.  Chest x-ray shows interstitial and airspace opacities over the right hemidiaphragm.  Difficult to exclude  a developing infection.  Patient received broad-spectrum antibiotics vancomycin and cefepime in ED.  [facility] hospitalist consulted for admission for septic arthritis versus gouty flare.   Assessment & Plan:   Active Problems:   Atrial fibrillation (HCC)   CAD (coronary artery disease)   HTN (hypertension)   Pacemaker   AMS (altered mental status)   Thrombocytopenia (HCC)   Acute on chronic kidney failure (HCC)   Joint pain   Acute hypoxemic respiratory failure (HCC)   Left knee pain likely secondary to acute gouty arthritis Patient presenting with acute versus subacute onset left knee pain with difficulty ambulating associated with erythema and swelling.  History of gout.  Fever of 101.0 on admission.  Left knee x-ray no acute osseous abnormality, trace effusion with chondrocalcinosis and minimal patellofemoral degenerative change.  Underwent left knee arthrocentesis by orthopedics on 08/17/2019 with findings of WBC count 12K, neutrophils 92, and notable for monourate crystals consistent with gout.  Uric acid 8.1. --Left knee body fluid culture: no growth x 2 days --Continue prednisone 40 mg p.o. daily; avoiding NSAIDs given his renal insufficiency, will taper over 7-10 days with pain improved/resolved --Continue PT/OT efforts; plan DC home with home health PT/OT/RN  Right shoulder pain Right shoulder x-ray notable for [facility] joint osteoarthritis, no appreciable fractures. --Continue pain control and therapy efforts  Acute metabolic encephalopathy Unclear etiology, suspect possibly from infectious/pneumonia.  Urinalysis, UDS, TSH, ammonia level, lactic acid all unremarkable/negative. --Appears to be back at his normal baseline  Acute hypoxemic respiratory failure Pneumonia, community-acquired Chest x-ray with interstitial airspace opacity right hemidiaphragm,  concerning for developing infection versus diaphragmatic atelectasis.  Procalcitonin 0.11, WBC count 10.7.  Fever 101.0 on  admission. --Fever free since admission --MRSA PCR: Negative, will discontinued vancomycin 4/16 --Stop cefepime for now; and start Azithromycin 500mg  PO daily x 5 days and cefdinir 300mg  PO daily x 8 days --Titrated off of supplemental oxygen yesterday --Incentive spirometry, flutter valve  Acute on chronic kidney disease stage IIIb Creatinine 2.0, GFR 28 on admission, baseline creatinine 1.43, GFR 43. --Cr. 2.0-->1.64-->1.49 --Continue to hold home Lasix 20mg  PO daily and losartan 100mg  PO daily --Avoid nephrotoxins, renally dose all medications --Repeat BMP in a.m.  Coronary artery disease status post CABG 1999 Stable.  No ACS symptoms. --Continue Imdur 90 mg p.o. daily  HLD: Continue simvastatin 20 mg nightly  Carotid artery disease: Status post endarterectomy  Essential hypertension:  --Holding home losartan 100 mg PO daily secondary to AKI --Increase amlodipine to 10 mg p.o. daily today --Monitor blood pressures closely  Persistent A. fib:  --Continue Eliquis 2.5 mg p.o. twice daily  Thrombocytopenia:  Platelet: 99-->159, No signs of active bleeding.  Complete heart block status post pacemaker: --V paced. DC tele today   DVT prophylaxis: Eliquis, SCDs Code Status: Full code Family Communication: Discussed with patient's daughter, Jesus Beck over the telephone this morning.  Disposition Plan:  Status is: Inpatient  Remains inpatient appropriate because:Ongoing active pain requiring inpatient pain management, Altered mental status, Ongoing diagnostic testing needed not appropriate for outpatient work up, Unsafe d/c plan, IV treatments appropriate due to intensity of illness or inability to take PO and Inpatient level of care appropriate due to severity of illness   Dispo: The patient is from: Home              Anticipated d/c is to: Home              Anticipated d/c date is: 3 days              Patient currently is not medically stable to d/c.   Consultants:    Orthopedics - Jesus Beck, Jesus Beck  Procedures:   Left knee arthrocentesis 08/17/2019  Antimicrobials:   Vancomycin 4/15 - 4/16  Cefepime 4/15>>   Subjective: Patient seen and examined bedside, resting comfortably in bedside chair.  Continues with left knee discomfort, states slightly improved today.  Continues on supplemental oxygen, not on home O2 per his report.  No other complaints this morning.  Denies headache, no chest pain, no palpitations, no shortness of breath, no cough/congestion, no abdominal pain, no weakness, no fatigue.  No acute events overnight per nursing staff.  Objective: Vitals:   08/18/19 1700 08/18/19 1942 08/19/19 0442 08/19/19 0731  BP: (!) 148/77 139/61 (!) 145/76 (!) 154/67  Pulse: 61 63 61 62  Resp: 18 20 20 16   Temp: 98.3 F (36.8 C) 98.1 F (36.7 C) 98.2 F (36.8 C) (!) 97.4 F (36.3 C)  TempSrc: Oral Oral Oral Oral  SpO2: 93% 91% 92% 99%  Weight:      Height:        Intake/Output Summary (Last 24 hours) at 08/19/2019 1129 Last data filed at 08/19/2019 1019 Gross per 24 hour  Intake 477 ml  Output 1450 ml  Net -973 ml   Filed Weights   08/17/19 1300  Weight: 67.6 kg    Examination:  General exam: Appears calm and comfortable, elderly in appearance Respiratory system: Breath sounds slightly decreased bilateral bases, Respiratory effort normal, on 3 L nasal cannula oxygenating 94% (not  on home O2) Cardiovascular system: S1 & S2 heard, RRR. No JVD, murmurs, rubs, gallops or clicks. No pedal edema. Gastrointestinal system: Abdomen is nondistended, soft and nontender. No organomegaly or masses felt. Normal bowel sounds heard. Central nervous system: Alert and oriented. No focal neurological deficits. Extremities: Left knee slightly edematous in appearance versus right, mild erythema, slightly tender to palpation, normal active/passive range of motion Skin: chronic venous stasis bilateral lower extremities Psychiatry: Judgement  and insight appear poor. Mood & affect appropriate.     Data Reviewed: I have personally reviewed following labs and imaging studies  CBC: Recent Labs  Lab 08/17/19 0929 08/17/19 1207 08/18/19 0436 08/19/19 0520  WBC 9.8  --  10.7* 14.1*  NEUTROABS 8.0*  --   --   --   HGB 13.5 12.2* 12.8* 13.3  HCT 41.4 36.0* 38.4* 40.2  MCV 96.5  --  95.3 93.9  PLT 99*  --  120* [id number]   Basic Metabolic Panel: Recent Labs  Lab 08/17/19 0929 08/17/19 1207 08/17/19 1337 08/18/19 0436 08/19/19 0520  NA 139 136  --  139 137  K 4.3 3.9  --  4.1 4.2  CL 102  --   --  107 103  CO2 22  --   --  18* 23  GLUCOSE 109*  --   --  102* 134*  BUN 39*  --   --  33* 38*  CREATININE 2.00*  --   --  1.64* 1.49*  CALCIUM 9.4  --   --  9.1 9.5  MG  --   --  1.5*  --  1.7  PHOS  --   --  2.7  --   --    GFR: Estimated Creatinine Clearance: 30.9 mL/min (A) (by C-G formula based on SCr of 1.49 mg/dL (H)). Liver Function Tests: Recent Labs  Lab 08/17/19 0929  AST 32  ALT 21  ALKPHOS 70  BILITOT 2.3*  PROT 6.5  ALBUMIN 3.2*   No results for input(s): LIPASE, AMYLASE in the last 168 hours. Recent Labs  Lab 08/17/19 1500  AMMONIA 13   Coagulation Profile: No results for input(s): INR, PROTIME in the last 168 hours. Cardiac Enzymes: No results for input(s): CKTOTAL, CKMB, CKMBINDEX, TROPONINI in the last 168 hours. BNP (last 3 results) No results for input(s): PROBNP in the last 8760 hours. HbA1C: No results for input(s): HGBA1C in the last 72 hours. CBG: No results for input(s): GLUCAP in the last 168 hours. Lipid Profile: No results for input(s): CHOL, HDL, LDLCALC, TRIG, CHOLHDL, LDLDIRECT in the last 72 hours. Thyroid Function Tests: Recent Labs    08/17/19 1337  TSH 1.028   Anemia Panel: No results for input(s): VITAMINB12, FOLATE, FERRITIN, TIBC, IRON, RETICCTPCT in the last 72 hours. Sepsis Labs: Recent Labs  Lab 08/17/19 1337 08/17/19 1350  PROCALCITON 0.11  --    LATICACIDVEN  --  1.4    Recent Results (from the past 240 hour(s))  SARS CORONAVIRUS 2 (TAT 6-24 HRS) Nasopharyngeal Nasopharyngeal Swab     Status: None   Collection Time: 08/17/19 11:12 AM   Specimen: Nasopharyngeal Swab  Result Value Ref Range Status   SARS Coronavirus 2 NEGATIVE NEGATIVE Final    Comment: (NOTE) SARS-CoV-2 target nucleic acids are NOT DETECTED. The SARS-CoV-2 RNA is generally detectable in upper and lower respiratory specimens during the acute phase of infection. Negative results do not preclude SARS-CoV-2 infection, do not rule out co-infections with other pathogens, and should not be  used as the sole basis for treatment or other patient management decisions. Negative results must be combined with clinical observations, patient history, and epidemiological information. The expected result is Negative. Fact Sheet for Patients: SugarRoll.be Fact Sheet for Healthcare Providers: https://www.woods-mathews.com/ This test is not yet approved or cleared by the [place] FDA and  has been authorized for detection and/or diagnosis of SARS-CoV-2 by FDA under an Emergency Use Authorization (EUA). This EUA will remain  in effect (meaning this test can be used) for the duration of the COVID-19 declaration under Section 56 4(b)(1) of the Act, 21 U.S.C. section 360bbb-3(b)(1), unless the authorization is terminated or revoked sooner. Performed at [facility], [place] [address] [place], [place] [address]   Culture, blood (Routine X 2) w Reflex to ID Panel     Status: None (Preliminary result)   Collection Time: 08/17/19  1:30 PM   Specimen: BLOOD LEFT ARM  Result Value Ref Range Status   Specimen Description BLOOD LEFT ARM  Final   Special Requests   Final    BOTTLES DRAWN AEROBIC AND ANAEROBIC Blood Culture adequate volume   Culture   Final    NO GROWTH 2 DAYS Performed at [facility], [place] [address]  [place], [place] [address]    Report Status PENDING  Incomplete  Culture, blood (Routine X 2) w Reflex to ID Panel     Status: None (Preliminary result)   Collection Time: 08/17/19  1:50 PM   Specimen: BLOOD LEFT HAND  Result Value Ref Range Status   Specimen Description BLOOD LEFT HAND  Final   Special Requests   Final    BOTTLES DRAWN AEROBIC AND ANAEROBIC Blood Culture results may not be optimal due to an inadequate volume of blood received in culture bottles   Culture   Final    NO GROWTH 2 DAYS Performed at [facility], [place] [address] [place], [place] [address]    Report Status PENDING  Incomplete  Body fluid culture     Status: None (Preliminary result)   Collection Time: 08/17/19  2:52 PM   Specimen: Synovium; Body Fluid  Result Value Ref Range Status   Specimen Description SYNOVIAL FLUID LEFT KNEE  Final   Special Requests NONE  Final   Gram Stain   Final    ABUNDANT WBC PRESENT, PREDOMINANTLY PMN NO ORGANISMS SEEN    Culture   Final    NO GROWTH 2 DAYS Performed at [facility], [address], [place], [place] [address]    Report Status PENDING  Incomplete  MRSA PCR Screening     Status: None   Collection Time: 08/18/19  9:12 AM   Specimen: Nasopharyngeal  Result Value Ref Range Status   MRSA by PCR NEGATIVE NEGATIVE Final    Comment:        The GeneXpert MRSA Assay (FDA approved for NASAL specimens only), is one component of a comprehensive MRSA colonization surveillance program. It is not intended to diagnose MRSA infection nor to guide or monitor treatment for MRSA infections. Performed at [facility], [place] [address] [place], [place] [address]          Radiology Studies: No results found.      Scheduled Meds: . amLODipine  5 mg Oral Daily  . apixaban  2.5 mg Oral BID  . isosorbide mononitrate  90 mg Oral Daily  . predniSONE  40 mg Oral Q breakfast  . simvastatin  20 mg Oral QHS   Continuous Infusions: . ceFEPime  (MAXIPIME) IV 2 g (08/18/19 1537)     LOS: 2 days    Time spent: 38 minutes spent on chart review, discussion with nursing staff, consultants, updating family and interview/physical exam; more than 50% of that time was spent in counseling and/or coordination of care.    Jesus Gergely J [place] DO [facility] Available via Epic secure chat 7am-7pm After these hours, please refer to coverage provider listed on [email or web address] 08/19/2019, 11:29 AM

## 2019-08-19 NOTE — TOC Initial Note (Addendum)
Transition of Care [facility]) - Initial/Assessment Note    Patient Details  Name: Jesus Beck MRN: [id number] Date of Birth: 1925-12-09  [facility] CM/SW Contact:    Claudie Leach, RN 08/19/2019, 5:28 PM  Clinical Narrative:                 Spoke with patient's daughter, Caren Griffins regarding dc plan.  Patient lives with Caren Griffins and 2 adult grandsons. Patient was previously very independent.  They are able to take care of patient 24/7 and do not want to consider SNF.  They are open to [facility] agency and would like to consider [facility].  Referral sent to [facility] with [place].  They will need hospital bed, overbed table (insruance may not cover- they are advised), wheelchair, 3n1, and RW to be delivered on Monday 4/18. [employer] does not have WC or tables.  Orders sent to [facility] with [facility].  Will need PT WC note.  Patient will need ambulance transport home to be carried up the stairs.  The family inquired about a chair lift for the stairs and state they read that can be covered by insurance.  Advised family we cannot arrange this and will need to be arranged through PCP and specialty company of their choice.     Patient requires hospital bed for frequent repositioning and to elevate the HOB in increments >30 degrees.  Patient needs a bed that will raise and lower to assist in care being provided and to assist patient in getting in/out of bed.     Expected Discharge Plan: [place] Barriers to Discharge: Unsafe home situation   Patient Goals and CMS Choice   CMS Medicare.gov Compare Post Acute Care list provided to:: Patient Represenative (must comment)(daughter) Choice offered to / list presented to : Adult Children  Expected Discharge Plan and Services Expected Discharge Plan: [place]   Discharge Planning Services: CM Consult Post Acute Care Choice: Durable Medical Equipment, Home Health Living arrangements for the past 2 months: Single Family  Home                           HH Arranged: RN, PT, OT, Nurse's Aide [place] Agency: [place] Date [facility] Agency Contacted: 08/19/19 Time [place]: 1727 Representative spoke with at [place] Arrangements/Services Living arrangements for the past 2 months: [place] Lives with:: Adult Children Patient language and need for interpreter reviewed:: Yes        Need for Family Participation in Patient Care: Yes (Comment) Care giver support system in place?: Yes (comment)   Criminal Activity/Legal Involvement Pertinent to Current Situation/Hospitalization: No - Comment as needed   Admission diagnosis:  Joint pain [M25.50] Hypoxia [R09.02] History of gout [Z87.39] Effusion of left knee [M25.462] Febrile illness [R50.9] Acute renal failure superimposed on chronic kidney disease, unspecified CKD stage, unspecified acute renal failure type ([place]) [N17.9, N18.9] Patient Active Problem List   Diagnosis Date Noted  . AMS (altered mental status) 08/17/2019  . Thrombocytopenia ([place]) 08/17/2019  . Acute on chronic kidney failure ([place]) 08/17/2019  . Joint pain 08/17/2019  . Acute hypoxemic respiratory failure ([place]) 08/17/2019  . Pacemaker 11/26/2014  . Atrial fibrillation with slow ventricular response ([place]) 08/27/2014  . Symptomatic bradycardia 08/27/2014  . CHB (complete heart block) ([place]) 08/27/2014  . Atrial fibrillation ([place])   . CAD (coronary artery disease)   .  Carotid artery stenosis   . RBBB   . HTN (hypertension)   . Hypercholesteremia    PCP:  Seward Carol, MD Pharmacy:   [facility], [place] - [place] [address] AT [place] [address] Phone: [phone] Fax: [place], [address] [place] [address] [place] [address] Phone: [phone] Fax: [phone]

## 2019-08-19 NOTE — Plan of Care (Signed)

## 2019-08-20 ENCOUNTER — Inpatient Hospital Stay (HOSPITAL_COMMUNITY): Payer: Medicare Other

## 2019-08-20 ENCOUNTER — Encounter (HOSPITAL_COMMUNITY): Payer: Self-pay | Admitting: Internal Medicine

## 2019-08-20 LAB — CBC
HCT: 38.9 % — ABNORMAL LOW (ref 39.0–52.0)
Hemoglobin: 13.1 g/dL (ref 13.0–17.0)
MCH: 31.3 pg (ref 26.0–34.0)
MCHC: 33.7 g/dL (ref 30.0–36.0)
MCV: 93.1 fL (ref 80.0–100.0)
Platelets: 160 10*3/uL (ref 150–400)
RBC: 4.18 MIL/uL — ABNORMAL LOW (ref 4.22–5.81)
RDW: 13.2 % (ref 11.5–15.5)
WBC: 11.3 10*3/uL — ABNORMAL HIGH (ref 4.0–10.5)
nRBC: 0 % (ref 0.0–0.2)

## 2019-08-20 LAB — BODY FLUID CULTURE: Culture: NO GROWTH

## 2019-08-20 LAB — BASIC METABOLIC PANEL
Anion gap: 9 (ref 5–15)
BUN: 43 mg/dL — ABNORMAL HIGH (ref 8–23)
CO2: 21 mmol/L — ABNORMAL LOW (ref 22–32)
Calcium: 9.3 mg/dL (ref 8.9–10.3)
Chloride: 108 mmol/L (ref 98–111)
Creatinine, Ser: 1.25 mg/dL — ABNORMAL HIGH (ref 0.61–1.24)
GFR calc Af Amer: 58 mL/min — ABNORMAL LOW (ref >60)
GFR calc non Af Amer: 50 mL/min — ABNORMAL LOW (ref >60)
Glucose, Bld: 130 mg/dL — ABNORMAL HIGH (ref 70–99)
Potassium: 4.6 mmol/L (ref 3.5–5.1)
Sodium: 138 mmol/L (ref 135–145)

## 2019-08-20 MED ORDER — PREDNISONE 5 MG PO TABS: 5.0000 mg | ORAL_TABLET | Freq: Every day | ORAL | Status: DC

## 2019-08-20 MED ORDER — PREDNISONE 20 MG PO TABS
30.0000 mg | ORAL_TABLET | Freq: Every day | ORAL | Status: AC
  Administered 2019-08-21 – 2019-08-22 (×2): 30 mg via ORAL
  Filled 2019-08-20 (×2): qty 1

## 2019-08-20 MED ORDER — PREDNISONE 10 MG PO TABS: 10.0000 mg | ORAL_TABLET | Freq: Every day | ORAL | Status: DC

## 2019-08-20 MED ORDER — PREDNISONE 20 MG PO TABS: 20.0000 mg | ORAL_TABLET | Freq: Every day | ORAL | Status: DC

## 2019-08-20 MED ORDER — SENNOSIDES-DOCUSATE SODIUM 8.6-50 MG PO TABS
1.0000 | ORAL_TABLET | Freq: Every evening | ORAL | Status: DC | PRN
  Administered 2019-08-21: 08:00:00 2 via ORAL
  Filled 2019-08-20: qty 2

## 2019-08-20 NOTE — Progress Notes (Signed)
Occupational Therapy Treatment Patient Details Name: Jesus Beck MRN: [id number] DOB: 24-Sep-1925 Today's Date: 08/20/2019    History of present illness Jesus Beck is a 86 y.o. male with medical history significant of coronary artery disease status post CABG in 1999, A. fib on Eliquis, hypertension, hyperlipidemia, CKD stage III, heart block status post pacemaker, gout presents to emergency department due to right shoulder pain, left knee pain and acute confusion. S/p left knee aspiration 08/17/2019; working diagnosis of septic arthritis versus gout flare up. L knee and R shoulder x-ray negative for acute abnormality.    OT comments  Pt fatigued this pm.  He requires min -mod A for bed mobility to EOB, and requires min A for dynamic sitting balance while attempting to perform ADLs tasks.  He requires mod A to move sit to stand.  Overall, he requires min - max A for ADLs and mod A for functional mobility. He does fatigue quickly.  DOE 3/4 on 2L supplemental 02.    Follow Up Recommendations  Home health OT;Supervision/Assistance - 24 hour    Equipment Recommendations  Wheelchair (measurements OT);Wheelchair cushion (measurements OT);Hospital bed    Recommendations for Other Services      Precautions / Restrictions Precautions Precautions: Fall       Mobility Bed Mobility Overal bed mobility: Needs Assistance Bed Mobility: Sit to Supine;Supine to Sit     Supine to sit: Mod assist Sit to supine: Min assist   General bed mobility comments: assist to initiate activity, assist for LEs and to lift trunk.  Pt required min A for LEs when returning to supine   Transfers Overall transfer level: Needs assistance Equipment used: Rolling walker (2 wheeled) Transfers: Sit to/from Stand Sit to Stand: Mod assist;From elevated surface         General transfer comment: does better with the bed height elevated.  He requires assist to boost into standing     Balance Overall balance  assessment: Needs assistance Sitting-balance support: Feet supported Sitting balance-Leahy Scale: Fair Sitting balance - Comments: able to maintain static sitting EOB with min guard assist, but requires min A for balance EOB - he looses balance posteriorly    Standing balance support: Bilateral upper extremity supported;During functional activity Standing balance-Leahy Scale: Poor Standing balance comment: reliant on UE support                            ADL either performed or assessed with clinical judgement   ADL Overall ADL's : Needs assistance/impaired Eating/Feeding: Set up;Sitting   Grooming: Wash/dry hands;Wash/dry face;Minimal assistance;Sitting Grooming Details (indicate cue type and reason): min A for balance              Lower Body Dressing: Moderate assistance;Sit to/from stand Lower Body Dressing Details (indicate cue type and reason): pt requires mod A to don/doff socks EOB and min A to maintain sitting balance, as he looses his balance posteriorly  Toilet Transfer: Moderate assistance;Ambulation;RW Toilet Transfer Details (indicate cue type and reason): assist to move sit to stand and assist for walker managment and balance  Toileting- Clothing Manipulation and Hygiene: Moderate assistance;Sit to/from stand       Functional mobility during ADLs: Moderate assistance;Rolling walker       Vision       Perception     Praxis      Cognition Arousal/Alertness: Awake/alert Behavior During Therapy: WFL for tasks assessed/performed Overall Cognitive Status: Impaired/Different from baseline Area  of Impairment: Orientation;Attention;Memory;Awareness;Problem solving                 Orientation Level: Disoriented to;Place;Time;Situation Current Attention Level: Sustained Memory: Decreased short-term memory       Problem Solving: Difficulty sequencing;Requires verbal cues General Comments: pt thinks at times that he is at home, then he  thought he was at therapist's home.  He is unaware of why he is here or the time         Exercises     Shoulder Instructions       General Comments DOE 3/4 on 2L supplemental 02     Pertinent Vitals/ Pain       Pain Assessment: Faces Faces Pain Scale: Hurts a little bit Pain Location: generalized discomfort  Pain Descriptors / Indicators: Discomfort;Guarding Pain Intervention(s): Monitored during session  Home Living                                          Prior Functioning/Environment              Frequency  Min 2X/week        Progress Toward Goals  OT Goals(current goals can now be found in the care plan section)  Progress towards OT goals: Not progressing toward goals - comment(due to fatigue )     Plan Discharge plan remains appropriate    Co-evaluation                 AM-PAC OT "6 Clicks" Daily Activity     Outcome Measure   Help from another person eating meals?: A Little Help from another person taking care of personal grooming?: A Little Help from another person toileting, which includes using toliet, bedpan, or urinal?: A Lot Help from another person bathing (including washing, rinsing, drying)?: A Lot Help from another person to put on and taking off regular upper body clothing?: A Lot Help from another person to put on and taking off regular lower body clothing?: A Lot 6 Click Score: 14    End of Session Equipment Utilized During Treatment: Rolling walker;Oxygen  OT Visit Diagnosis: Unsteadiness on feet (R26.81);Other abnormalities of gait and mobility (R26.89);Muscle weakness (generalized) (M62.81);Pain Pain - part of body: (generalized )   Activity Tolerance Patient limited by fatigue   Patient Left in bed;with call bell/phone within reach   Nurse Communication Mobility status        Time: [id number] OT Time Calculation (min): 19 min  Charges: OT General Charges $OT Visit: 1 Visit OT Treatments $Self  Care/Home Management : 8-22 mins  Nilsa Nutting OTR/L Acute Rehabilitation Services Pager [phone] Office [phone]    Lucille Passy M 08/20/2019, 4:53 PM

## 2019-08-20 NOTE — Plan of Care (Signed)

## 2019-08-20 NOTE — Progress Notes (Signed)
   Durable Medical Equipment (From admission, onward)       Start     Ordered  08/19/19 1740   For home use only DME 3 n 1  Once    08/19/19 1740  08/19/19 1740   For home use only DME Walker rolling  Once   Question Answer Comment Walker: With [employer] Wheels  Patient needs a walker to treat with the following condition Knee pain    08/19/19 1740  08/19/19 1739   For home use only DME Hospital bed  Once   Question Answer Comment Length of Need 6 Months  Patient has (list medical condition): coronary artery disease status post CABG, A. fib on Eliquis, hypertension, hyperlipidemia, CKD stage III, heart block status post pacemaker, gout presents to emergency department due to right shoulder pain, left knee pain and acute confusion  The above medical condition requires: Patient requires the ability to reposition frequently  Head must be elevated greater than: 30 degrees  Bed type Semi-electric  Support Surface: Gel Overlay    08/19/19 1740  08/19/19 1738   For home use only DME Overbed table  Once    08/19/19 1740  08/19/19 1736   For home use only DME lightweight manual wheelchair with seat cushion  Once   Comments: Patient suffers from knee pain which impairs their ability to perform daily activities like mobilizing, ADLs in the home.  A walker will not resolve  issue with performing activities of daily living. A wheelchair will allow patient to safely perform daily activities. Patient is not able to propel themselves in the home using a standard weight wheelchair due to weakness. Patient can self propel in the lightweight wheelchair. Length of need lifetime Accessories: elevating leg rests (ELRs), wheel locks, extensions, back cushion and anti-tippers.  08/19/19 1740

## 2019-08-20 NOTE — Progress Notes (Signed)
PROGRESS NOTE    Witten Stenerson Lichtenberger  [id number] DOB: 08-01-1925 DOA: 08/17/2019 PCP: Seward Carol, MD    Brief Narrative:  Lamon B Ottaway is a 86 y.o. male with medical history significant of coronary artery disease status post CABG in 1999, A. fib on Eliquis, hypertension, hyperlipidemia, CKD stage III, heart block status post pacemaker, gout presents to emergency department due to right shoulder pain and left knee pain since 4 days.  As per daughter Caren Griffins over the phone: Patient has been confused since last night.  She reports that he wanted to go to the store to buy strawberries last night and also iron clothes late at night.  Reports that he was not like himself.  Has generalized weakness, dental pain and decreased appetite since 2 days.  Reports diarrhea started Sunday however resolved on its own.  Has history of gout in left knee, ankle and foot.  Takes colchicine and tramadol at home.  Reports left knee swelling and pain which is progressive.  Unable to walk due to left knee pain.    No history of headache, blurry vision, loss of consciousness, head trauma, slurred speech, facial droop, fever, chills, nausea, vomiting, stomachache, urinary or sleep changes.  Patient lives with his family at home.  No history of smoking, alcohol, street drug use.  He is independent on daily life activities.  No recent medication changes.  Covid vaccine: Up-to-date.  Upon arrival to ED, patient had fever of 101, tachypneic, no leukocytosis, platelet: 99, CMP shows AKI on CKD stage III, UA negative for infection.  Blood cultures, lactic acid and COVID-19 pending.  Hypoxic upon arrival.  Requiring 3 L of oxygen via nasal cannula.  X-ray of left knee shows trace effusion, chondrocalcinosis.  Minimal patellofemoral degenerative change.  X-ray of right shoulder shows mild acromioclavicular joint arthritis.  Chest x-ray shows interstitial and airspace opacities over the right hemidiaphragm.  Difficult to exclude  a developing infection.  Patient received broad-spectrum antibiotics vancomycin and cefepime in ED.  [facility] hospital[facility] consulted for admission for septic arthritis versus gouty flare.   Assessment & Plan:   Active Problems:   Atrial fibrillation (HCC)   CAD (coronary artery disease)   HTN (hypertension)   Pacemaker   AMS (altered mental status)   Thrombocytopenia (HCC)   Acute on chronic kidney failure (HCC)   Joint pain   Acute hypoxemic respiratory failure (HCC)   Left knee pain likely secondary to acute gouty arthritis Patient presenting with acute versus subacute onset left knee pain with difficulty ambulating associated with erythema and swelling.  History of gout.  Fever of 101.0 on admission.  Left knee x-ray no acute osseous abnormality, trace effusion with chondrocalcinosis and minimal patellofemoral degenerative change.  Underwent left knee arthrocentesis by orthopedics on 08/17/2019 with findings of WBC count 12K, neutrophils 92, and notable for monourate crystals consistent with gout.  Uric acid 8.1. --Left knee body fluid culture: no growth x 2 days --Continue prednisone 40 mg p.o. daily; avoiding NSAIDs given his renal insufficiency, will start to taper tomorrow over a 7-day, now the pain has improved and almost resolved.  --Continue PT/OT efforts; plan DC home with home health PT/OT/RN, hospital bed, wheelchair, 3 and 1 bedside commode, rolling walker when equipment has arrived  Right shoulder pain Right shoulder x-ray notable for [facility] joint osteoarthritis, no appreciable fractures. --Continue pain control and therapy efforts  Acute metabolic encephalopathy Unclear etiology, suspect possibly from infectious/pneumonia.  Urinalysis, UDS, TSH, ammonia level, lactic acid all unremarkable/negative. --  Appears to be back at his normal baseline  Acute hypoxemic respiratory failure Pneumonia, community-acquired Chest x-ray with interstitial airspace opacity right hemidiaphragm,  concerning for developing infection versus diaphragmatic atelectasis.  Procalcitonin 0.11, WBC count 10.7.  Fever 101.0 on admission. --Fever free since admission --MRSA PCR: Negative, will discontinued vancomycin 4/16 --Stop cefepime for now; and start Azithromycin 500mg  PO daily x 5 days and cefdinir 300mg  PO daily x 8 days --Replaced on oxygen overnight --Repeat chest x-ray this morning --6-minute walk test to see if qualifies for home O2 --Incentive spirometry, flutter valve  Acute on chronic kidney disease stage IIIb Creatinine 2.0, GFR 28 on admission, baseline creatinine 1.43, GFR 43. --Cr. 2.0-->1.64-->1.49-->1.25 --Continue to hold home Lasix 20mg  PO daily and losartan 100mg  PO daily --Avoid nephrotoxins, renally dose all medications --Repeat BMP in a.m.  Coronary artery disease status post CABG 1999 Stable.  No ACS symptoms. --Continue Imdur 90 mg p.o. daily  HLD: Continue simvastatin 20 mg nightly  Carotid artery disease: Status post endarterectomy  Essential hypertension:  --Holding home losartan 100 mg PO daily secondary to AKI --Increased amlodipine to 10 mg p.o. daily --Monitor blood pressures closely  Persistent A. fib:  --Continue Eliquis 2.5 mg p.o. twice daily  Thrombocytopenia:  Platelet: 99-->159-->160, No signs of active bleeding.  Complete heart block status post pacemaker: --V paced. Tappen tele 4/17   DVT prophylaxis: Eliquis, SCDs Code Status: Full code Family Communication: Discussed with patient's daughter, Caren Griffins over the telephone this morning.  Disposition Plan:  Status is: Inpatient  Remains inpatient appropriate because:Ongoing active pain requiring inpatient pain management, Altered mental status, Ongoing diagnostic testing needed not appropriate for outpatient work up, Unsafe d/c plan, IV treatments appropriate due to intensity of illness or inability to take PO and Inpatient level of care appropriate due to severity of  illness   Dispo: The patient is from: Home              Anticipated d/c is to: Home              Anticipated d/c date is: 2 days              Patient currently is not medically stable to d/c.  Awaiting equipment to be delivered to home.   Consultants:   Orthopedics - [facility] Odor, Dr. Griffin Basil  Procedures:   Left knee arthrocentesis 08/17/2019  Antimicrobials:   Vancomycin 4/15 - 4/16  Cefepime 4/15>>   Subjective: Patient seen and examined bedside, resting comfortably in bed.  Utilizing incentive spirometer this morning.  Was replaced on supplemental oxygen overnight.  Left knee discomfort has almost resolved.   No other complaints this morning.  Denies headache, no chest pain, no palpitations, no shortness of breath, no cough/congestion, no abdominal pain, no weakness, no fatigue.  No acute events overnight per nursing staff.  Objective: Vitals:   08/20/19 0437 08/20/19 0500 08/20/19 0944 08/20/19 0947  BP: (!) 146/72  (!) 155/69 (!) 155/69  Pulse: 65  62   Resp: 20     Temp: 97.8 F (36.6 C)  (!) 97.5 F (36.4 C)   TempSrc: Oral  Oral   SpO2: 91%  (!) 86% 92%  Weight:  68.2 kg    Height:        Intake/Output Summary (Last 24 hours) at 08/20/2019 1112 Last data filed at 08/20/2019 0300 Gross per 24 hour  Intake 120 ml  Output 1650 ml  Net -1530 ml   Filed Weights   08/17/19 1300  08/20/19 0500  Weight: 67.6 kg 68.2 kg    Examination:  General exam: Appears calm and comfortable, elderly in appearance Respiratory system: Breath sounds slightly decreased bilateral bases, Respiratory effort normal, on 2 L nasal cannula oxygenating 91% (not on home O2) Cardiovascular system: S1 & S2 heard, RRR. No JVD, murmurs, rubs, gallops or clicks. No pedal edema. Gastrointestinal system: Abdomen is nondistended, soft and nontender. No organomegaly or masses felt. Normal bowel sounds heard. Central nervous system: Alert and oriented. No focal neurological  deficits. Extremities: Muscle strength globally intact bilateral upper/lower extremities, normal range of motion passively/actively 1 no peripheral edema Skin: chronic venous stasis bilateral lower extremities Psychiatry: Judgement and insight appear poor. Mood & affect appropriate.     Data Reviewed: I have personally reviewed following labs and imaging studies  CBC: Recent Labs  Lab 08/17/19 0929 08/17/19 1207 08/18/19 0436 08/19/19 0520 08/20/19 0201  WBC 9.8  --  10.7* 14.1* 11.3*  NEUTROABS 8.0*  --   --   --   --   HGB 13.5 12.2* 12.8* 13.3 13.1  HCT 41.4 36.0* 38.4* 40.2 38.9*  MCV 96.5  --  95.3 93.9 93.1  PLT 99*  --  120* 159 0000000   Basic Metabolic Panel: Recent Labs  Lab 08/17/19 0929 08/17/19 1207 08/17/19 1337 08/18/19 0436 08/19/19 0520 08/20/19 0201  NA 139 136  --  139 137 138  K 4.3 3.9  --  4.1 4.2 4.6  CL 102  --   --  107 103 108  CO2 22  --   --  18* 23 21*  GLUCOSE 109*  --   --  102* 134* 130*  BUN 39*  --   --  33* 38* 43*  CREATININE 2.00*  --   --  1.64* 1.49* 1.25*  CALCIUM 9.4  --   --  9.1 9.5 9.3  MG  --   --  1.5*  --  1.7  --   PHOS  --   --  2.7  --   --   --    GFR: Estimated Creatinine Clearance: 37.1 mL/min (A) (by C-G formula based on SCr of 1.25 mg/dL (H)). Liver Function Tests: Recent Labs  Lab 08/17/19 0929  AST 32  ALT 21  ALKPHOS 70  BILITOT 2.3*  PROT 6.5  ALBUMIN 3.2*   No results for input(s): LIPASE, AMYLASE in the last 168 hours. Recent Labs  Lab 08/17/19 1500  AMMONIA 13   Coagulation Profile: No results for input(s): INR, PROTIME in the last 168 hours. Cardiac Enzymes: No results for input(s): CKTOTAL, CKMB, CKMBINDEX, TROPONINI in the last 168 hours. BNP (last 3 results) No results for input(s): PROBNP in the last 8760 hours. HbA1C: No results for input(s): HGBA1C in the last 72 hours. CBG: No results for input(s): GLUCAP in the last 168 hours. Lipid Profile: No results for input(s): CHOL, HDL,  LDLCALC, TRIG, CHOLHDL, LDLDIRECT in the last 72 hours. Thyroid Function Tests: Recent Labs    08/17/19 1337  TSH 1.028   Anemia Panel: No results for input(s): VITAMINB12, FOLATE, FERRITIN, TIBC, IRON, RETICCTPCT in the last 72 hours. Sepsis Labs: Recent Labs  Lab 08/17/19 1337 08/17/19 1350  PROCALCITON 0.11  --   LATICACIDVEN  --  1.4    Recent Results (from the past 240 hour(s))  SARS CORONAVIRUS 2 (TAT 6-24 HRS) Nasopharyngeal Nasopharyngeal Swab     Status: None   Collection Time: 08/17/19 11:12 AM   Specimen: Nasopharyngeal  Swab  Result Value Ref Range Status   SARS Coronavirus 2 NEGATIVE NEGATIVE Final    Comment: (NOTE) SARS-CoV-2 target nucleic acids are NOT DETECTED. The SARS-CoV-2 RNA is generally detectable in upper and lower respiratory specimens during the acute phase of infection. Negative results do not preclude SARS-CoV-2 infection, do not rule out co-infections with other pathogens, and should not be used as the sole basis for treatment or other patient management decisions. Negative results must be combined with clinical observations, patient history, and epidemiological information. The expected result is Negative. Fact Sheet for Patients: SugarRoll.be Fact Sheet for Healthcare Providers: https://www.woods-mathews.com/ This test is not yet approved or cleared by the [place] FDA and  has been authorized for detection and/or diagnosis of SARS-CoV-2 by FDA under an Emergency Use Authorization (EUA). This EUA will remain  in effect (meaning this test can be used) for the duration of the COVID-19 declaration under Section 56 4(b)(1) of the Act, 21 U.S.C. section 360bbb-3(b)(1), unless the authorization is terminated or revoked sooner. Performed at [facility], [place] [address], [place], [place] [address]   Culture, blood (Routine X 2) w Reflex to ID Panel     Status: None (Preliminary result)    Collection Time: 08/17/19  1:30 PM   Specimen: BLOOD LEFT ARM  Result Value Ref Range Status   Specimen Description BLOOD LEFT ARM  Final   Special Requests   Final    BOTTLES DRAWN AEROBIC AND ANAEROBIC Blood Culture adequate volume   Culture   Final    NO GROWTH 3 DAYS Performed at [facility], [place] [address] [place], [place] [address]    Report Status PENDING  Incomplete  Culture, blood (Routine X 2) w Reflex to ID Panel     Status: None (Preliminary result)   Collection Time: 08/17/19  1:50 PM   Specimen: BLOOD LEFT HAND  Result Value Ref Range Status   Specimen Description BLOOD LEFT HAND  Final   Special Requests   Final    BOTTLES DRAWN AEROBIC AND ANAEROBIC Blood Culture results may not be optimal due to an inadequate volume of blood received in culture bottles   Culture   Final    NO GROWTH 3 DAYS Performed at [facility], [place] [address] [place], [place] [address]    Report Status PENDING  Incomplete  Body fluid culture     Status: None   Collection Time: 08/17/19  2:52 PM   Specimen: Synovium; Body Fluid  Result Value Ref Range Status   Specimen Description SYNOVIAL FLUID LEFT KNEE  Final   Special Requests NONE  Final   Gram Stain   Final    ABUNDANT WBC PRESENT, PREDOMINANTLY PMN NO ORGANISMS SEEN    Culture   Final    NO GROWTH 3 DAYS Performed at [facility], [address], [place], [place] [address]    Report Status 08/20/2019 FINAL  Final  MRSA PCR Screening     Status: None   Collection Time: 08/18/19  9:12 AM   Specimen: Nasopharyngeal  Result Value Ref Range Status   MRSA by PCR NEGATIVE NEGATIVE Final    Comment:        The GeneXpert MRSA Assay (FDA approved for NASAL specimens only), is one component of a comprehensive MRSA colonization surveillance program. It is not intended to diagnose MRSA infection nor to guide or monitor treatment for MRSA infections. Performed at [facility], [place]  [address]  [place], [place] [address]          Radiology Studies: No results found.      Scheduled Meds: . amLODipine  10 mg Oral Daily  . apixaban  2.5 mg Oral BID  . azithromycin  500 mg Oral Daily  . cefdinir  300 mg Oral Daily  . isosorbide mononitrate  90 mg Oral Daily  . predniSONE  40 mg Oral Q breakfast  . simvastatin  20 mg Oral QHS   Continuous Infusions:    LOS: 3 days    Time spent: 35 minutes spent on chart review, discussion with nursing staff, consultants, updating family and interview/physical exam; more than 50% of that time was spent in counseling and/or coordination of care.    Shaylah Mcghie J [place] DO [facility] Available via Epic secure chat 7am-7pm After these hours, please refer to coverage provider listed on [email or web address] 08/20/2019, 11:12 AM

## 2019-08-21 LAB — BASIC METABOLIC PANEL
Anion gap: 11 (ref 5–15)
BUN: 42 mg/dL — ABNORMAL HIGH (ref 8–23)
CO2: 25 mmol/L (ref 22–32)
Calcium: 9.6 mg/dL (ref 8.9–10.3)
Chloride: 103 mmol/L (ref 98–111)
Creatinine, Ser: 1.31 mg/dL — ABNORMAL HIGH (ref 0.61–1.24)
GFR calc Af Amer: 55 mL/min — ABNORMAL LOW (ref >60)
GFR calc non Af Amer: 47 mL/min — ABNORMAL LOW (ref >60)
Glucose, Bld: 120 mg/dL — ABNORMAL HIGH (ref 70–99)
Potassium: 4.3 mmol/L (ref 3.5–5.1)
Sodium: 139 mmol/L (ref 135–145)

## 2019-08-21 MED ORDER — POLYETHYLENE GLYCOL 3350 17 G PO PACK
17.0000 g | PACK | Freq: Once | ORAL | Status: AC
  Administered 2019-08-21: 17 g via ORAL
  Filled 2019-08-21: qty 1

## 2019-08-21 MED ORDER — CEFDINIR 300 MG PO CAPS
300.0000 mg | ORAL_CAPSULE | Freq: Two times a day (BID) | ORAL | Status: DC
  Administered 2019-08-21 – 2019-08-22 (×2): 300 mg via ORAL
  Filled 2019-08-21 (×3): qty 1

## 2019-08-21 MED ORDER — FUROSEMIDE 20 MG PO TABS
20.0000 mg | ORAL_TABLET | Freq: Every day | ORAL | Status: DC
  Administered 2019-08-21 – 2019-08-22 (×2): 20 mg via ORAL
  Filled 2019-08-21 (×2): qty 1

## 2019-08-21 NOTE — Progress Notes (Signed)
PHARMACY NOTE:  ANTIMICROBIAL RENAL DOSAGE ADJUSTMENT  Current antimicrobial regimen includes a mismatch between antimicrobial dosage and estimated renal function.  As per policy approved by the Pharmacy & Therapeutics and Medical Executive Committees, the antimicrobial dosage will be adjusted accordingly.  Current antimicrobial dosage:  Cefdinir 300mg  po daily  Indication: CAP  Renal Function:   Estimated Creatinine Clearance: 35.3 mL/min (A) (by C-G formula based on SCr of 1.31 mg/dL (H)). []      On intermittent HD, scheduled: []      On CRRT    Antimicrobial dosage has been changed to:  Cefdinir 300mg  po BID   Additional comments: Total 8 days duration  Thank you for allowing pharmacy to be a part of this patient's care.  Efraim Kaufmann, [facility] 08/21/2019 1:08 PM

## 2019-08-21 NOTE — Progress Notes (Signed)
PROGRESS NOTE    Jesus Beck  [id number] DOB: 02-28-1926 DOA: 08/17/2019 PCP: Seward Carol, MD    Brief Narrative:  Jesus Beck is a 86 y.o. male with medical history significant of coronary artery disease status post CABG in 1999, A. fib on Eliquis, hypertension, hyperlipidemia, CKD stage III, heart block status post pacemaker, gout presents to emergency department due to right shoulder pain and left knee pain since 4 days.  As per daughter Jesus Beck over the phone: Patient has been confused since last night.  She reports that he wanted to go to the store to buy strawberries last night and also iron clothes late at night.  Reports that he was not like himself.  Has generalized weakness, dental pain and decreased appetite since 2 days.  Reports diarrhea started Sunday however resolved on its own.  Has history of gout in left knee, ankle and foot.  Takes colchicine and tramadol at home.  Reports left knee swelling and pain which is progressive.  Unable to walk due to left knee pain.    No history of headache, blurry vision, loss of consciousness, head trauma, slurred speech, facial droop, fever, chills, nausea, vomiting, stomachache, urinary or sleep changes.  Patient lives with his family at home.  No history of smoking, alcohol, street drug use.  He is independent on daily life activities.  No recent medication changes.  Covid vaccine: Up-to-date.  Upon arrival to ED, patient had fever of 101, tachypneic, no leukocytosis, platelet: 99, CMP shows AKI on CKD stage III, UA negative for infection.  Blood cultures, lactic acid and COVID-19 pending.  Hypoxic upon arrival.  Requiring 3 L of oxygen via nasal cannula.  X-ray of left knee shows trace effusion, chondrocalcinosis.  Minimal patellofemoral degenerative change.  X-ray of right shoulder shows mild acromioclavicular joint arthritis.  Chest x-ray shows interstitial and airspace opacities over the right hemidiaphragm.  Difficult to exclude  a developing infection.  Patient received broad-spectrum antibiotics vancomycin and cefepime in ED.  [facility] consulted for admission for septic arthritis versus gouty flare.   Assessment & Plan:   Active Problems:   Atrial fibrillation (HCC)   CAD (coronary artery disease)   HTN (hypertension)   Pacemaker   AMS (altered mental status)   Thrombocytopenia (HCC)   Acute on chronic kidney failure (HCC)   Joint pain   Acute hypoxemic respiratory failure (HCC)   Left knee pain likely secondary to acute gouty arthritis Patient presenting with acute versus subacute onset left knee pain with difficulty ambulating associated with erythema and swelling.  History of gout.  Fever of 101.0 on admission.  Left knee x-ray no acute osseous abnormality, trace effusion with chondrocalcinosis and minimal patellofemoral degenerative change.  Underwent left knee arthrocentesis by orthopedics on 08/17/2019 with findings of WBC count 12K, neutrophils 92, and notable for monourate crystals consistent with gout.  Uric acid 8.1. --Left knee body fluid culture: no growth x 3 days; no organisms seen on Gram stain --Continue prednisone taper now that knee pain has resolved; avoiding NSAIDs given his renal insufficiency  --Continue PT/OT efforts; plan DC home with home health PT/OT/RN, hospital bed, wheelchair, 3 and 1 bedside commode, rolling walker when equipment has arrived  Right shoulder pain Right shoulder x-ray notable for [facility] joint osteoarthritis, no appreciable fractures. --Continue pain control and therapy efforts  Acute metabolic encephalopathy Unclear etiology, suspect possibly from infectious/pneumonia.  Urinalysis, UDS, TSH, ammonia level, lactic acid all unremarkable/negative. --Appears to be back at his normal  baseline  Acute hypoxemic respiratory failure Pneumonia, community-acquired Chest x-ray with interstitial airspace opacity right hemidiaphragm, concerning for developing infection  versus diaphragmatic atelectasis.  Procalcitonin 0.11, WBC count 10.7.  Fever 101.0 on admission. --Fever free since admission --MRSA PCR: Negative, will discontinued vancomycin 4/16 --Stop cefepime for now; and start Azithromycin 500mg  PO daily x 5 days and cefdinir 300mg  PO daily x 8 days --Replaced on oxygen overnight --6-minute walk test to see if qualifies for home O2 today --Incentive spirometry, flutter valve  Acute on chronic kidney disease stage IIIb Creatinine 2.0, GFR 28 on admission, baseline creatinine 1.43, GFR 43. --Cr. 2.0-->1.64-->1.49-->1.25-->1.31 --Resumed home Lasix 20 mg today --Continue to hold home losartan 100mg  PO daily --Avoid nephrotoxins, renally dose all medications --Repeat BMP in a.m.  Coronary artery disease status post CABG 1999 Stable.  No ACS symptoms. --Continue Imdur 90 mg p.o. daily  HLD: Continue simvastatin 20 mg nightly  Carotid artery disease: Status post endarterectomy  Essential hypertension:  --Holding home losartan 100 mg PO daily secondary to AKI --Increased amlodipine to 10 mg p.o. daily --Start home Lasix today --Monitor blood pressures closely  Persistent A. fib:  --Continue Eliquis 2.5 mg p.o. twice daily  Thrombocytopenia:  Platelet: 99-->159-->160, No signs of active bleeding.  Complete heart block status post pacemaker: --V paced. [place] tele 4/17   DVT prophylaxis: Eliquis, SCDs Code Status: Full code Family Communication: Discussed with patient's daughter, Jesus Beck over the telephone this morning.  Disposition Plan:  Status is: Inpatient  Remains inpatient appropriate because:Ongoing active pain requiring inpatient pain management, Altered mental status, Ongoing diagnostic testing needed not appropriate for outpatient work up, Unsafe d/c plan, IV treatments appropriate due to intensity of illness or inability to take PO and Inpatient level of care appropriate due to severity of illness   Dispo: The patient is  from: Home              Anticipated d/c is to: Home with home health               Anticipated d/c date is: 1 day              Patient currently is not medically stable to d/c.  Awaiting equipment to be delivered to home.   Consultants:   Orthopedics - [facility], Dr. Griffin Basil  Procedures:   Left knee arthrocentesis 08/17/2019  Antimicrobials:   Vancomycin 4/15 - 4/16  Cefepime 4/15>>   Subjective: Patient seen and examined bedside, resting comfortably in bed.  States was "caught up in the sheets overnight".  Awaiting for his breakfast this morning.  No other complaints.  No family at bedside, updated patient's daughter via telephone this morning; she is waiting for delivery of home health equipment later today.  Patient was replaced on oxygen overnight, concerns he may need supplemental oxygen at nighttime.  Will have PT assess with 6-minute walk test today.  Denies headache, no chest pain, no palpitations, no shortness of breath, no cough/congestion, no abdominal pain, no weakness, no fatigue.  No acute events overnight per nursing staff.  Objective: Vitals:   08/20/19 1916 08/21/19 0419 08/21/19 0450 08/21/19 0726  BP: (!) 139/96 (!) 147/62  (!) 151/73  Pulse: 61 60  60  Resp: 20 16  17   Temp: 98.3 F (36.8 C) 98.3 F (36.8 C)  98.2 F (36.8 C)  TempSrc: Oral Oral  Oral  SpO2: 93% 96%  95%  Weight:   68 kg   Height:  Intake/Output Summary (Last 24 hours) at 08/21/2019 1040 Last data filed at 08/21/2019 0826 Gross per 24 hour  Intake 717 ml  Output 1650 ml  Net -933 ml   Filed Weights   08/17/19 1300 08/20/19 0500 08/21/19 0450  Weight: 67.6 kg 68.2 kg 68 kg    Examination:  General exam: Appears calm and comfortable, elderly in appearance Respiratory system: Breath sounds slightly decreased bilateral bases, Respiratory effort normal, on 2 L nasal cannula oxygenating 96% (not on home O2) Cardiovascular system: S1 & S2 heard, RRR. No JVD, murmurs, rubs,  gallops or clicks. No pedal edema. Gastrointestinal system: Abdomen is nondistended, soft and nontender. No organomegaly or masses felt. Normal bowel sounds heard. Central nervous system: Alert and oriented. No focal neurological deficits. Extremities: Muscle strength globally intact bilateral upper/lower extremities, normal range of motion passively/actively, no peripheral edema Skin: chronic venous stasis bilateral lower extremities Psychiatry: Judgement and insight appear poor. Mood & affect appropriate.     Data Reviewed: I have personally reviewed following labs and imaging studies  CBC: Recent Labs  Lab 08/17/19 0929 08/17/19 1207 08/18/19 0436 08/19/19 0520 08/20/19 0201  WBC 9.8  --  10.7* 14.1* 11.3*  NEUTROABS 8.0*  --   --   --   --   HGB 13.5 12.2* 12.8* 13.3 13.1  HCT 41.4 36.0* 38.4* 40.2 38.9*  MCV 96.5  --  95.3 93.9 93.1  PLT 99*  --  120* 159 0000000   Basic Metabolic Panel: Recent Labs  Lab 08/17/19 0929 08/17/19 0929 08/17/19 1207 08/17/19 1337 08/18/19 0436 08/19/19 0520 08/20/19 0201 08/21/19 0450  NA 139   < > 136  --  139 137 138 139  K 4.3   < > 3.9  --  4.1 4.2 4.6 4.3  CL 102  --   --   --  107 103 108 103  CO2 22  --   --   --  18* 23 21* 25  GLUCOSE 109*  --   --   --  102* 134* 130* 120*  BUN 39*  --   --   --  33* 38* 43* 42*  CREATININE 2.00*  --   --   --  1.64* 1.49* 1.25* 1.31*  CALCIUM 9.4  --   --   --  9.1 9.5 9.3 9.6  MG  --   --   --  1.5*  --  1.7  --   --   PHOS  --   --   --  2.7  --   --   --   --    < > = values in this interval not displayed.   GFR: Estimated Creatinine Clearance: 35.3 mL/min (A) (by C-G formula based on SCr of 1.31 mg/dL (H)). Liver Function Tests: Recent Labs  Lab 08/17/19 0929  AST 32  ALT 21  ALKPHOS 70  BILITOT 2.3*  PROT 6.5  ALBUMIN 3.2*   No results for input(s): LIPASE, AMYLASE in the last 168 hours. Recent Labs  Lab 08/17/19 1500  AMMONIA 13   Coagulation Profile: No results for  input(s): INR, PROTIME in the last 168 hours. Cardiac Enzymes: No results for input(s): CKTOTAL, CKMB, CKMBINDEX, TROPONINI in the last 168 hours. BNP (last 3 results) No results for input(s): PROBNP in the last 8760 hours. HbA1C: No results for input(s): HGBA1C in the last 72 hours. CBG: No results for input(s): GLUCAP in the last 168 hours. Lipid Profile: No results for input(s):  CHOL, HDL, LDLCALC, TRIG, CHOLHDL, LDLDIRECT in the last 72 hours. Thyroid Function Tests: No results for input(s): TSH, T4TOTAL, FREET4, T3FREE, THYROIDAB in the last 72 hours. Anemia Panel: No results for input(s): VITAMINB12, FOLATE, FERRITIN, TIBC, IRON, RETICCTPCT in the last 72 hours. Sepsis Labs: Recent Labs  Lab 08/17/19 1337 08/17/19 1350  PROCALCITON 0.11  --   LATICACIDVEN  --  1.4    Recent Results (from the past 240 hour(s))  SARS CORONAVIRUS 2 (TAT 6-24 HRS) Nasopharyngeal Nasopharyngeal Swab     Status: None   Collection Time: 08/17/19 11:12 AM   Specimen: Nasopharyngeal Swab  Result Value Ref Range Status   SARS Coronavirus 2 NEGATIVE NEGATIVE Final    Comment: (NOTE) SARS-CoV-2 target nucleic acids are NOT DETECTED. The SARS-CoV-2 RNA is generally detectable in upper and lower respiratory specimens during the acute phase of infection. Negative results do not preclude SARS-CoV-2 infection, do not rule out co-infections with other pathogens, and should not be used as the sole basis for treatment or other patient management decisions. Negative results must be combined with clinical observations, patient history, and epidemiological information. The expected result is Negative. Fact Sheet for Patients: SugarRoll.be Fact Sheet for Healthcare Providers: https://www.woods-mathews.com/ This test is not yet approved or cleared by the [place] FDA and  has been authorized for detection and/or diagnosis of SARS-CoV-2 by FDA under an Emergency  Use Authorization (EUA). This EUA will remain  in effect (meaning this test can be used) for the duration of the COVID-19 declaration under Section 56 4(b)(1) of the Act, 21 U.S.C. section 360bbb-3(b)(1), unless the authorization is terminated or revoked sooner. Performed at [facility], [place] [address], [place], [place] [address]   Culture, blood (Routine X 2) w Reflex to ID Panel     Status: None (Preliminary result)   Collection Time: 08/17/19  1:30 PM   Specimen: BLOOD LEFT ARM  Result Value Ref Range Status   Specimen Description BLOOD LEFT ARM  Final   Special Requests   Final    BOTTLES DRAWN AEROBIC AND ANAEROBIC Blood Culture adequate volume   Culture   Final    NO GROWTH 3 DAYS Performed at [facility], [place] [address], [place], [place] [address]    Report Status PENDING  Incomplete  Culture, blood (Routine X 2) w Reflex to ID Panel     Status: None (Preliminary result)   Collection Time: 08/17/19  1:50 PM   Specimen: BLOOD LEFT HAND  Result Value Ref Range Status   Specimen Description BLOOD LEFT HAND  Final   Special Requests   Final    BOTTLES DRAWN AEROBIC AND ANAEROBIC Blood Culture results may not be optimal due to an inadequate volume of blood received in culture bottles   Culture   Final    NO GROWTH 3 DAYS Performed at [facility], [place] [address] [place], [place] [address]    Report Status PENDING  Incomplete  Body fluid culture     Status: None   Collection Time: 08/17/19  2:52 PM   Specimen: Synovium; Body Fluid  Result Value Ref Range Status   Specimen Description SYNOVIAL FLUID LEFT KNEE  Final   Special Requests NONE  Final   Gram Stain   Final    ABUNDANT WBC PRESENT, PREDOMINANTLY PMN NO ORGANISMS SEEN    Culture   Final    NO GROWTH 3 DAYS Performed at [facility], [address] [place],  [place], [place] [address]    Report Status 08/20/2019 FINAL  Final  MRSA PCR Screening     Status: None   Collection Time: 08/18/19   9:12 AM   Specimen: Nasopharyngeal  Result Value Ref Range Status   MRSA by PCR NEGATIVE NEGATIVE Final    Comment:        The GeneXpert MRSA Assay (FDA approved for NASAL specimens only), is one component of a comprehensive MRSA colonization surveillance program. It is not intended to diagnose MRSA infection nor to guide or monitor treatment for MRSA infections. Performed at [facility], [place] [address], [place], [place] [address]          Radiology Studies: DG CHEST PORT 1 VIEW  Result Date: 08/20/2019 CLINICAL DATA:  Dyspnea EXAM: PORTABLE CHEST 1 VIEW COMPARISON:  08/17/2019 chest radiograph. FINDINGS: Stable configuration of single lead left subclavian pacemaker. Intact sternotomy wires. Stable cardiomediastinal silhouette with borderline mild cardiomegaly. No pneumothorax. Stable prominent elevation of the right hemidiaphragm. Borderline mild pulmonary edema. Mild blunting of the costophrenic angles bilaterally with mild bibasilar atelectasis. IMPRESSION: 1. Borderline mild congestive heart failure. 2. Mild bibasilar atelectasis with possible small bilateral pleural effusions. 3. Chronic elevation of the right hemidiaphragm. Electronically Signed   By: Ilona Sorrel M.D.   On: 08/20/2019 13:08        Scheduled Meds: . amLODipine  10 mg Oral Daily  . apixaban  2.5 mg Oral BID  . azithromycin  500 mg Oral Daily  . cefdinir  300 mg Oral Daily  . furosemide  20 mg Oral Daily  . isosorbide mononitrate  90 mg Oral Daily  . polyethylene glycol  17 g Oral Once  . predniSONE  30 mg Oral Q breakfast   Followed by  . [START ON 08/23/2019] predniSONE  20 mg Oral Q breakfast   Followed by  . [START ON 08/25/2019] predniSONE  10 mg Oral Q breakfast   Followed by  . [START ON 08/27/2019] predniSONE  5 mg Oral Q breakfast  . simvastatin  20 mg Oral QHS   Continuous Infusions:    LOS: 4 days    Time spent: 34 minutes spent on chart review, discussion with nursing staff,  consultants, updating family and interview/physical exam; more than 50% of that time was spent in counseling and/or coordination of care.    Kayen Grabel J [place] DO [facility] Available via Epic secure chat 7am-7pm After these hours, please refer to coverage provider listed on [email or web address] 08/21/2019, 10:40 AM

## 2019-08-21 NOTE — Progress Notes (Signed)
PT Progress Note  SATURATION QUALIFICATIONS: (This note is used to comply with regulatory documentation for home oxygen)  Patient Saturations on Room Air at Rest = 86%  Patient Saturations on Room Air while Ambulating = 84%  Patient Saturations on 2 Liters of oxygen while Ambulating = 90%  Please briefly explain why patient needs home oxygen: Pt unable to maintain SpO2 > 90% on RA. With 2L supplemental O2, SpO2 90% during mobility and 92% at rest.  Lorrin Goodell, PT  Office # [phone] Pager [phone]

## 2019-08-21 NOTE — Progress Notes (Signed)
Physical Therapy Treatment Patient Details Name: Jesus Beck MRN: [id number] DOB: 11-24-1925 Today's Date: 08/21/2019    History of Present Illness Jesus Beck is a 86 y.o. male with medical history significant of coronary artery disease status post CABG in 1999, A. fib on Eliquis, hypertension, hyperlipidemia, CKD stage III, heart block status post pacemaker, gout presents to emergency department due to right shoulder pain, left knee pain and acute confusion. S/p left knee aspiration 08/17/2019; working diagnosis of septic arthritis versus gout flare up. L knee and R shoulder x-ray negative for acute abnormality.     PT Comments    Pt received in bed, agreeable to participation in therapy. He required min assist bed mobility, mod assist sit to stand, and min guard assist ambulation 30' x 2 with rollator. Pt required 2L O2 to maintain SpO2 > 90%. Pt in recliner with feet elevated at end of session. Home equipment recommendations updated.    Follow Up Recommendations  Home health PT;Supervision/Assistance - 24 hour     Equipment Recommendations  Wheelchair cushion (measurements PT);Wheelchair (measurements PT);Hospital bed    Recommendations for Other Services       Precautions / Restrictions Precautions Precautions: Fall;Other (comment) Precaution Comments: watch sats Restrictions Weight Bearing Restrictions: No    Mobility  Bed Mobility Overal bed mobility: Needs Assistance Bed Mobility: Supine to Sit     Supine to sit: Min assist;HOB elevated     General bed mobility comments: +rail, increased time  Transfers Overall transfer level: Needs assistance Equipment used: 4-wheeled walker Transfers: Sit to/from Stand Sit to Stand: Mod assist         General transfer comment: cues for hand placement. Assist to power up and stabilize balance  Ambulation/Gait Ambulation/Gait assistance: Min guard Gait Distance (Feet): 30 Feet(x 2) Assistive device: 4-wheeled  walker Gait Pattern/deviations: Step-through pattern;Decreased stride length;Trunk flexed Gait velocity: decreased Gait velocity interpretation: <1.31 ft/sec, indicative of household ambulator General Gait Details: 30' x 2 trials with rollator min guard assist. Seated rest break between trials. Amb on RA with desat to 84%. 2L O2 in place during seated rest to recover to 92%.   Stairs             Wheelchair Mobility    Modified Rankin (Stroke Patients Only)       Balance Overall balance assessment: Needs assistance Sitting-balance support: Feet supported;No upper extremity supported Sitting balance-Leahy Scale: Fair     Standing balance support: Bilateral upper extremity supported;During functional activity Standing balance-Leahy Scale: Poor Standing balance comment: reliant on UE support                             Cognition Arousal/Alertness: Awake/alert Behavior During Therapy: WFL for tasks assessed/performed Overall Cognitive Status: Impaired/Different from baseline Area of Impairment: Memory;Awareness;Problem solving;Safety/judgement;Orientation                 Orientation Level: Disoriented to;Situation   Memory: Decreased short-term memory   Safety/Judgement: Decreased awareness of safety   Problem Solving: Difficulty sequencing;Requires verbal cues        Exercises      General Comments General comments (skin integrity, edema, etc.): 3/4 DOE      Pertinent Vitals/Pain Pain Assessment: No/denies pain    Home Living                      Prior Function  PT Goals (current goals can now be found in the care plan section) Acute Rehab PT Goals Patient Stated Goal: home Progress towards PT goals: Progressing toward goals    Frequency    Min 3X/week      PT Plan Equipment recommendations need to be updated    Co-evaluation              AM-PAC PT "6 Clicks" Mobility   Outcome Measure  Help  needed turning from your back to your side while in a flat bed without using bedrails?: None Help needed moving from lying on your back to sitting on the side of a flat bed without using bedrails?: A Little Help needed moving to and from a bed to a chair (including a wheelchair)?: A Little Help needed standing up from a chair using your arms (e.g., wheelchair or bedside chair)?: A Lot Help needed to walk in hospital room?: A Little Help needed climbing 3-5 steps with a railing? : A Lot 6 Click Score: 17    End of Session Equipment Utilized During Treatment: Gait belt;Oxygen Activity Tolerance: Patient tolerated treatment well Patient left: in chair;with call bell/phone within reach;with chair alarm set Nurse Communication: Mobility status PT Visit Diagnosis: Unsteadiness on feet (R26.81);Muscle weakness (generalized) (M62.81);Difficulty in walking, not elsewhere classified (R26.2);Pain     Time: [id number] PT Time Calculation (min) (ACUTE ONLY): 28 min  Charges:  $Gait Training: 23-37 mins                     Lorrin Goodell, [place]  Office # [phone] Pager [phone]    Lorriane Shire 08/21/2019, 12:28 PM

## 2019-08-21 NOTE — Plan of Care (Signed)
  Problem: Health Behavior/Discharge Planning: Goal: Ability to manage health-related needs will improve Outcome: Progressing   Problem: Clinical Measurements: Goal: Ability to maintain clinical measurements within normal limits will improve Outcome: Progressing   Problem: Activity: Goal: Risk for activity intolerance will decrease Outcome: Progressing   Problem: Nutrition: Goal: Adequate nutrition will be maintained Outcome: Progressing   Problem: Coping: Goal: Level of anxiety will decrease Outcome: Progressing   Problem: Elimination: Goal: Will not experience complications related to bowel motility Outcome: Progressing   Problem: Safety: Goal: Ability to remain free from injury will improve Outcome: Progressing   Problem: Skin Integrity: Goal: Risk for impaired skin integrity will decrease Outcome: Progressing   

## 2019-08-21 NOTE — Progress Notes (Signed)
PT Progress Note   Patient suffers from CAD and R knee septic arthritis vs gout which impairs their ability to perform daily activities like ambulating in the home.  A walker alone will not resolve the issues with performing activities of daily living. A wheelchair will allow patient to safely perform daily activities.  The patient can self propel in the home or has a caregiver who can provide assistance.     Lorrin Goodell, PT  Office # [phone] Pager [phone]

## 2019-08-22 DIAGNOSIS — R0689 Other abnormalities of breathing: Secondary | ICD-10-CM

## 2019-08-22 LAB — BASIC METABOLIC PANEL
Anion gap: 9 (ref 5–15)
BUN: 41 mg/dL — ABNORMAL HIGH (ref 8–23)
CO2: 27 mmol/L (ref 22–32)
Calcium: 9.4 mg/dL (ref 8.9–10.3)
Chloride: 104 mmol/L (ref 98–111)
Creatinine, Ser: 1.29 mg/dL — ABNORMAL HIGH (ref 0.61–1.24)
GFR calc Af Amer: 56 mL/min — ABNORMAL LOW (ref >60)
GFR calc non Af Amer: 48 mL/min — ABNORMAL LOW (ref >60)
Glucose, Bld: 121 mg/dL — ABNORMAL HIGH (ref 70–99)
Potassium: 4.8 mmol/L (ref 3.5–5.1)
Sodium: 140 mmol/L (ref 135–145)

## 2019-08-22 LAB — CULTURE, BLOOD (ROUTINE X 2)
Culture: NO GROWTH
Culture: NO GROWTH
Special Requests: ADEQUATE

## 2019-08-22 MED ORDER — PREDNISONE 5 MG PO TABS: ORAL_TABLET | ORAL | 0 refills | Status: AC

## 2019-08-22 MED ORDER — AZITHROMYCIN 500 MG PO TABS: 500.0000 mg | ORAL_TABLET | Freq: Every day | ORAL | 0 refills | Status: AC

## 2019-08-22 MED ORDER — CEFDINIR 300 MG PO CAPS: 300.0000 mg | ORAL_CAPSULE | Freq: Two times a day (BID) | ORAL | 0 refills | Status: AC

## 2019-08-22 MED ORDER — AMLODIPINE BESYLATE 10 MG PO TABS: 10.0000 mg | ORAL_TABLET | Freq: Every day | ORAL | 0 refills | Status: AC

## 2019-08-22 NOTE — Plan of Care (Signed)

## 2019-08-22 NOTE — Consult Note (Signed)
   [facility] CM Inpatient Consult   08/22/2019  Jesus Beck Jan 02, 1926 [id number]  Patient's chart reviewed for eligibility for Medicare [employer] with [place].  Patient is eligible for [facility] programs with Medicare and his primary care provider Dr. Seward Carol, MD with [facility].  This provider office is listed to do the Transition of Care follow up.   Of note, [facility] Care Management services does not replace or interfere with any services that are arranged by inpatient case management or social work.  For additional questions or referrals please contact:    Natividad Brood, RN BSN [facility] Liaison  [phone] business mobile phone Toll free office [phone]  Fax number: [phone] [email or web address]

## 2019-08-22 NOTE — Discharge Summary (Signed)
Physician Discharge Summary  Jesus Beck [id number] DOB: [birth year] DOA: 08/17/2019  PCP: Seward Carol, MD  Admit date: 08/17/2019 Discharge date: 08/22/2019  Admitted From:  Disposition:    Recommendations for Outpatient Follow-up:  1. Follow up with PCP in 1-2 weeks 2. Please obtain BMP in one week to assess renal function 3. Amlodipine increased to 10 mg p.o. daily 4. Discontinue losartan secondary to renal insufficiency with AKI on hospital admission, may consider restarting at a reduced dose if renal function stabilizes and further needs hypertensive control as losartan has shown to aid in treatment of gout 5. Continue prednisone taper for acute gout flare 6. Continue antibiotics with azithromycin and cefdinir to complete course for pneumonia 7. Continue conversations with family and patient regarding goals of care   Home Health:  PT/OT/RN/aide Equipment/Devices: Wheelchair, hospital bed, oxygen 2 L [place]  Discharge Condition: Stable CODE STATUS: Full code Diet recommendation: Heart healthy, low-sodium  History of present illness:  Montez B Koble is a 86 y.o.malewith medical history significant ofcoronary artery disease status post CABG in 1999, A. fib on Eliquis, hypertension, hyperlipidemia, CKD stage III, heart block status post pacemaker, gout presents to emergency department due to right shoulder pain and left knee pain since 4 days.  As per daughter Caren Griffins over the phone: Patient has been confused since last night. She reports that he wanted to go to the store to buy strawberries last night and also iron clothes late at night. Reports that he was not like himself. Has generalized weakness, dental pain and decreased appetite since 2 days. Reports diarrhea started Sunday however resolved on its own.  Has history of gout in left knee, ankle and foot. Takes colchicine and tramadol at home. Reports left knee swelling and pain which is progressive. Unable to walk due  to left knee pain.   No history of headache, blurry vision, loss of consciousness, head trauma, slurred speech, facial droop, fever, chills, nausea, vomiting, stomachache, urinary or sleep changes.  Patient lives with his family at home. No history of smoking, alcohol, street drug use. He is independent on daily life activities. No recent medication changes. Covid vaccine: Up-to-date.  Upon arrival to ED, patient had fever of 101, tachypneic, no leukocytosis, platelet: 99, CMP shows AKI on CKD stage III, UA negative for infection. Blood cultures, lactic acid and COVID-19 pending. Hypoxic upon arrival. Requiring 3 L of oxygen via nasal cannula. X-ray of left knee shows trace effusion, chondrocalcinosis. Minimal patellofemoral degenerative change. X-ray of right shoulder shows mild acromioclavicular joint arthritis. Chest x-ray shows interstitial and airspace opacities over the right hemidiaphragm. Difficult to exclude a developing infection. Patient received broad-spectrum antibiotics vancomycin and cefepime in ED. [facility] hospitalist consulted for admission for septic arthritis versus gouty flare.  Hospital course:  Left knee pain likely secondary to acute gouty arthritis Patient presenting with acute versus subacute onset left knee pain with difficulty ambulating associated with erythema and swelling.  History of gout.  Fever of 101.0 on admission.  Left knee x-ray no acute osseous abnormality, trace effusion with chondrocalcinosis and minimal patellofemoral degenerative change.  Underwent left knee arthrocentesis by orthopedics on 08/17/2019 with findings of WBC count 12K, neutrophils 92, and notable for monourate crystals consistent with gout.  Uric acid 8.1. Left knee body fluid culture with no growth x 3 days; no organisms seen on Gram stain.  Blood cultures x2 no growth x4 days.  Patient was started on prednisone 40 mg daily, and now tapering that pain has resolved.  Continue therapy  efforts with home health PT/OT on discharge.  Also discharging with further equipment with hospital bed, wheelchair, 3 and 1 bedside commode, rolling walker.  May need to consider long-term treatment for his gout following his acute resolution.  Right shoulder pain Right shoulder x-ray notable for [facility] joint osteoarthritis, no appreciable fractures.  Continue PT/OT at home.  Acute metabolic encephalopathy Unclear etiology, suspect possibly from infectious/pneumonia.  Urinalysis, UDS, TSH, ammonia level, lactic acid all unremarkable/negative.  Has now returned to his normal baseline.  Acute hypoxemic respiratory failure Pneumonia, community-acquired Chronic respiratory insufficiency Chest x-ray with interstitial airspace opacity right hemidiaphragm, concerning for developing infection versus diaphragmatic atelectasis.  Procalcitonin 0.11, WBC count 10.7.  Fever 101.0 on admission.  Started on antibiotics with vancomycin and cefepime which was the escalated to azithromycin and cefdinir.  6-minute walk test noted continued hypoxia on ambulation with SPO2 82%, with good recovery at 2 L nasal cannula.  Will discharge home on home oxygen.  Acute on chronic kidney disease stage IIIb Creatinine 2.0, GFR 28 on admission, baseline creatinine 1.43, GFR 43.  Patient's home losartan was discontinued and initially his Lasix was held.  Patient's creatinine improved to 1.29 at time of discharge.  Patient's home furosemide has been restarted.  Discontinued losartan.  Recommend follow-up BMP in 1 week at PCP visit.  Coronary artery disease status post CABG 1999 Stable.  No ACS symptoms. Continue Imdur 90 mg p.o. daily  HLD: Continue simvastatin 20 mg nightly  Carotid artery disease: Status post endarterectomy  Essential hypertension:  Increase amlodipine to 10 mg p.o. daily.  Continue furosemide and Imdur.  Discontinue losartan secondary to acute on chronic renal insufficiency as above.  Persistent A.  fib: Continue Eliquis 2.5 mg p.o. twice daily  Thrombocytopenia:  Platelets stable, 160 at time of discharge.  No signs of active bleeding.  Complete heart block status post pacemaker: EKG notable for ventricularly paced.  Discharge Diagnoses:  Active Problems:   Atrial fibrillation (HCC)   CAD (coronary artery disease)   HTN (hypertension)   Pacemaker   AMS (altered mental status)   Thrombocytopenia (HCC)   Acute on chronic kidney failure (HCC)   Joint pain   Acute hypoxemic respiratory failure (HCC)   Chronic respiratory insufficiency    Discharge Instructions  Discharge Instructions    (HEART FAILURE PATIENTS) Call MD:  Anytime you have any of the following symptoms: 1) 3 pound weight gain in 24 hours or 5 pounds in 1 week 2) shortness of breath, with or without a dry hacking cough 3) swelling in the hands, feet or stomach 4) if you have to sleep on extra pillows at night in order to breathe.   Complete by: As directed    Call MD for:  difficulty breathing, headache or visual disturbances   Complete by: As directed    Call MD for:  extreme fatigue   Complete by: As directed    Call MD for:  persistant dizziness or light-headedness   Complete by: As directed    Call MD for:  persistant nausea and vomiting   Complete by: As directed    Call MD for:  severe uncontrolled pain   Complete by: As directed    Call MD for:  temperature >100.4   Complete by: As directed    Diet - low sodium heart healthy   Complete by: As directed    Increase activity slowly   Complete by: As directed      Allergies as  of 08/22/2019      Reactions   Lipitor [atorvastatin] Hives      Medication List    STOP taking these medications   losartan 100 MG tablet Commonly known as: COZAAR     TAKE these medications   amLODipine 10 MG tablet Commonly known as: NORVASC Take 1 tablet (10 mg total) by mouth daily. Start taking on: August 23, 2019 What changed:   medication  strength  how much to take   azithromycin 500 MG tablet Commonly known as: ZITHROMAX Take 1 tablet (500 mg total) by mouth daily for 3 days. Start taking on: August 23, 2019   cefdinir 300 MG capsule Commonly known as: OMNICEF Take 1 capsule (300 mg total) by mouth every 12 (twelve) hours for 5 days.   Colcrys 0.6 MG tablet Generic drug: colchicine Take 0.6 mg by mouth daily.   Eliquis 2.5 MG Tabs tablet Generic drug: apixaban TAKE 1 TABLET(2.5 MG) BY MOUTH TWICE DAILY What changed: See the new instructions.   furosemide 20 MG tablet Commonly known as: LASIX Take 20 mg by mouth daily.   isosorbide mononitrate 60 MG 24 hr tablet Commonly known as: IMDUR TAKE 1 AND 1/2 TABLETS BY MOUTH DAILY What changed: additional instructions   nitroGLYCERIN 0.4 MG SL tablet Commonly known as: NITROSTAT Place 1 tablet (0.4 mg total) under the tongue every 5 (five) minutes as needed for chest pain.   OMEGA 3 PO Take 2,000 Units by mouth 2 (two) times daily.   predniSONE 5 MG tablet Commonly known as: DELTASONE Take 4 tablets (20 mg total) by mouth daily with breakfast for 1 day, THEN 2 tablets (10 mg total) daily with breakfast for 2 days, THEN 1 tablet (5 mg total) daily with breakfast for 2 days. Start taking on: August 22, 2019   simvastatin 20 MG tablet Commonly known as: ZOCOR TAKE 1 TABLET BY MOUTH EVERY NIGHT AT BEDTIME What changed: when to take this   traMADol 50 MG tablet Commonly known as: ULTRAM Take 50 mg by mouth daily as needed (pain).            Durable Medical Equipment  (From admission, onward)         Start     Ordered   08/21/19 1317  For home use only DME oxygen  Once    Question Answer Comment  Length of Need 12 Months   Mode or (Route) Nasal cannula   Liters per Minute 2   Frequency Continuous (stationary and portable oxygen unit needed)   Oxygen delivery system Gas      08/21/19 1317   08/19/19 1740  For home use only DME 3 n 1  Once      08/19/19 1740   08/19/19 1740  For home use only DME Walker rolling  Once    Question Answer Comment  Walker: With [place] Wheels   Patient needs a walker to treat with the following condition Knee pain      08/19/19 1740   08/19/19 1739  For home use only DME Hospital bed  Once    Question Answer Comment  Length of Need 6 Months   Patient has (list medical condition): coronary artery disease status post CABG, A. fib on Eliquis, hypertension, hyperlipidemia, CKD stage III, heart block status post pacemaker, gout presents to emergency department due to right shoulder pain, left knee pain and acute confusion   The above medical condition requires: Patient requires the ability to reposition frequently  Head must be elevated greater than: 30 degrees   Bed type Semi-electric   Support Surface: Gel Overlay      08/19/19 1740   08/19/19 1738  For home use only DME Overbed table  Once     08/19/19 1740   08/19/19 1736  For home use only DME lightweight manual wheelchair with seat cushion  Once    Comments: Patient suffers from knee pain which impairs their ability to perform daily activities like mobilizing, ADLs in the home.  A walker will not resolve  issue with performing activities of daily living. A wheelchair will allow patient to safely perform daily activities. Patient is not able to propel themselves in the home using a standard weight wheelchair due to weakness. Patient can self propel in the lightweight wheelchair. Length of need lifetime Accessories: elevating leg rests (ELRs), wheel locks, extensions, back cushion and anti-tippers.   08/19/19 1740         Follow-up Information    [facility] Follow up.   Why: DME- Bed/table, RW, 3n1, W/C arranged- to be delivered to the home Contact information: [place] [address] [phone]        [facility] Follow up on 08/21/2019.   Specialty: Home Health Services Why: home health services  arranged Contact information: [address] [place] [address] [phone]          Allergies  Allergen Reactions  . Lipitor [Atorvastatin] Hives    Consultations:  Orthopedics - Dr. Griffin Basil   Procedures/Studies: DG Chest 2 View  Result Date: 08/17/2019 CLINICAL DATA:  Weakness. EXAM: CHEST - 2 VIEW COMPARISON:  08/28/2014 FINDINGS: Single lead pacer device in situ enters via left subclavian approach. Power pack over left chest. Changes of median sternotomy with CABG. Cardiomediastinal contours are stable. Linear scarring or atelectasis in the right mid chest. Right hemidiaphragmatic elevation similar to prior study. Vague airspace and interstitial opacities overlying the right hemidiaphragm. No sign of pleural effusion. Visualized skeletal structures are unremarkable. IMPRESSION: 1. Interstitial and airspace opacities over the right hemidiaphragm likely juxta diaphragmatic atelectasis. Difficult to exclude developing infection. 2. Stable right hemidiaphragmatic elevation. Electronically Signed   By: Zetta Bills M.D.   On: 08/17/2019 09:57   DG Shoulder Right  Result Date: 08/17/2019 CLINICAL DATA:  Right shoulder pain EXAM: RIGHT SHOULDER - 2+ VIEW COMPARISON:  None. FINDINGS: Frontal and transscapular views of the right shoulder are obtained. No displaced fracture. Alignment is anatomic. Mild hypertrophic changes of the acromioclavicular joint. Chronic scarring throughout the right lung. IMPRESSION: 1. Mild acromioclavicular joint arthritis.  No acute fracture. Electronically Signed   By: Randa Ngo M.D.   On: 08/17/2019 00:54   DG CHEST PORT 1 VIEW  Result Date: 08/20/2019 CLINICAL DATA:  Dyspnea EXAM: PORTABLE CHEST 1 VIEW COMPARISON:  08/17/2019 chest radiograph. FINDINGS: Stable configuration of single lead left subclavian pacemaker. Intact sternotomy wires. Stable cardiomediastinal silhouette with borderline mild cardiomegaly. No pneumothorax. Stable  prominent elevation of the right hemidiaphragm. Borderline mild pulmonary edema. Mild blunting of the costophrenic angles bilaterally with mild bibasilar atelectasis. IMPRESSION: 1. Borderline mild congestive heart failure. 2. Mild bibasilar atelectasis with possible small bilateral pleural effusions. 3. Chronic elevation of the right hemidiaphragm. Electronically Signed   By: Ilona Sorrel M.D.   On: 08/20/2019 13:08   DG Knee Complete 4 Views Left  Result Date: 08/17/2019 CLINICAL DATA:  Knee pain EXAM: LEFT KNEE - COMPLETE 4+ VIEW COMPARISON:  01/24/2018  FINDINGS: Vascular calcifications. Faint joint space calcification. No fracture or malalignment. Minimal patellofemoral degenerative change. Small effusion IMPRESSION: 1. No acute osseous abnormality 2. Trace effusion 3. Chondrocalcinosis.  Minimal patellofemoral degenerative change Electronically Signed   By: Donavan Foil M.D.   On: 08/17/2019 00:55   CUP PACEART REMOTE DEVICE CHECK  Result Date: 08/07/2019 Scheduled remote reviewed. Normal device function.  2 VHR, longest 5 beats, possible oversensing only markers to review. Next remote 91 days.     Subjective: Patient seen and examined bedside, resting comfortably bedside chair.  Eating breakfast.  No complaints this morning.  Ready for discharge home.  Denies headache, no fever/chills/night sweats, nausea/vomiting/diarrhea, no chest pain, palpitations, no shortness of breath, no abdominal pain, no joint pain.  No acute events overnight per nursing staff.  Discharge Exam: Vitals:   08/22/19 0500 08/22/19 0812  BP: (!) 144/77 (!) 150/70  Pulse: 62 62  Resp: 20 18  Temp: 98 F (36.7 C) (!) 97.5 F (36.4 C)  SpO2: 90% (!) 84%   Vitals:   08/21/19 1630 08/21/19 2016 08/22/19 0500 08/22/19 0812  BP: (!) 146/74 (!) 142/72 (!) 144/77 (!) 150/70  Pulse: 72 66 62 62  Resp: 17 20 20 18   Temp: 98.3 F (36.8 C) 97.9 F (36.6 C) 98 F (36.7 C) (!) 97.5 F (36.4 C)  TempSrc: Oral Oral  Oral Oral  SpO2: 96% 94% 90% (!) 84%  Weight:   69 kg   Height:        General: Pt is alert, awake, not in acute distress Cardiovascular: RRR, S1/S2 +, no rubs, no gallops Respiratory: CTA bilaterally, no wheezing, no rhonchi Abdominal: Soft, NT, ND, bowel sounds + Extremities: no edema, no cyanosis    The results of significant diagnostics from this hospitalization (including imaging, microbiology, ancillary and laboratory) are listed below for reference.     Microbiology: Recent Results (from the past 240 hour(s))  SARS CORONAVIRUS 2 (TAT 6-24 HRS) Nasopharyngeal Nasopharyngeal Swab     Status: None   Collection Time: 08/17/19 11:12 AM   Specimen: Nasopharyngeal Swab  Result Value Ref Range Status   SARS Coronavirus 2 NEGATIVE NEGATIVE Final    Comment: (NOTE) SARS-CoV-2 target nucleic acids are NOT DETECTED. The SARS-CoV-2 RNA is generally detectable in upper and lower respiratory specimens during the acute phase of infection. Negative results do not preclude SARS-CoV-2 infection, do not rule out co-infections with other pathogens, and should not be used as the sole basis for treatment or other patient management decisions. Negative results must be combined with clinical observations, patient history, and epidemiological information. The expected result is Negative. Fact Sheet for Patients: SugarRoll.be Fact Sheet for Healthcare Providers: https://www.woods-mathews.com/ This test is not yet approved or cleared by the [place] FDA and  has been authorized for detection and/or diagnosis of SARS-CoV-2 by FDA under an Emergency Use Authorization (EUA). This EUA will remain  in effect (meaning this test can be used) for the duration of the COVID-19 declaration under Section 56 4(b)(1) of the Act, 21 U.S.C. section 360bbb-3(b)(1), unless the authorization is terminated or revoked sooner. Performed at [facility], [place] [address], [place], [place] [address]   Culture, blood (Routine X 2) w Reflex to ID Panel     Status: None (Preliminary result)   Collection Time: 08/17/19  1:30 PM   Specimen: BLOOD LEFT ARM  Result Value Ref Range Status   Specimen Description BLOOD LEFT ARM  Final   Special Requests  Final    BOTTLES DRAWN AEROBIC AND ANAEROBIC Blood Culture adequate volume   Culture   Final    NO GROWTH 4 DAYS Performed at [facility], [place] [address], [place], [place] [address]    Report Status PENDING  Incomplete  Culture, blood (Routine X 2) w Reflex to ID Panel     Status: None (Preliminary result)   Collection Time: 08/17/19  1:50 PM   Specimen: BLOOD LEFT HAND  Result Value Ref Range Status   Specimen Description BLOOD LEFT HAND  Final   Special Requests   Final    BOTTLES DRAWN AEROBIC AND ANAEROBIC Blood Culture results may not be optimal due to an inadequate volume of blood received in culture bottles   Culture   Final    NO GROWTH 4 DAYS Performed at [facility], [place] [address] [place], [place] [address]    Report Status PENDING  Incomplete  Body fluid culture     Status: None   Collection Time: 08/17/19  2:52 PM   Specimen: Synovium; Body Fluid  Result Value Ref Range Status   Specimen Description SYNOVIAL FLUID LEFT KNEE  Final   Special Requests NONE  Final   Gram Stain   Final    ABUNDANT WBC PRESENT, PREDOMINANTLY PMN NO ORGANISMS SEEN    Culture   Final    NO GROWTH 3 DAYS Performed at [facility], [address] [place], [place] [address]    Report Status 08/20/2019 FINAL  Final  MRSA PCR Screening     Status: None   Collection Time: 08/18/19  9:12 AM   Specimen: Nasopharyngeal  Result Value Ref Range Status   MRSA by PCR NEGATIVE NEGATIVE Final    Comment:        The GeneXpert MRSA Assay (FDA approved for NASAL specimens only), is one component of a comprehensive MRSA colonization surveillance program. It is not intended to diagnose  MRSA infection nor to guide or monitor treatment for MRSA infections. Performed at [facility], [place] [address], [place], [place] [address]      Labs: BNP (last 3 results) Recent Labs    08/19/19 0520  BNP [id number]*   Basic Metabolic Panel: Recent Labs  Lab 08/17/19 0929 08/17/19 1337 08/18/19 0436 08/19/19 0520 08/20/19 0201 08/21/19 0450 08/22/19 0234  NA   < >  --  139 137 138 139 140  K   < >  --  4.1 4.2 4.6 4.3 4.8  CL   < >  --  107 103 108 103 104  CO2   < >  --  18* 23 21* 25 27  GLUCOSE   < >  --  102* 134* 130* 120* 121*  BUN   < >  --  33* 38* 43* 42* 41*  CREATININE   < >  --  1.64* 1.49* 1.25* 1.31* 1.29*  CALCIUM   < >  --  9.1 9.5 9.3 9.6 9.4  MG  --  1.5*  --  1.7  --   --   --   PHOS  --  2.7  --   --   --   --   --    < > = values in this interval not displayed.   Liver Function Tests: Recent Labs  Lab 08/17/19 0929  AST 32  ALT 21  ALKPHOS 70  BILITOT 2.3*  PROT 6.5  ALBUMIN 3.2*   No results  for input(s): LIPASE, AMYLASE in the last 168 hours. Recent Labs  Lab 08/17/19 1500  AMMONIA 13   CBC: Recent Labs  Lab 08/17/19 0929 08/17/19 1207 08/18/19 0436 08/19/19 0520 08/20/19 0201  WBC 9.8  --  10.7* 14.1* 11.3*  NEUTROABS 8.0*  --   --   --   --   HGB 13.5 12.2* 12.8* 13.3 13.1  HCT 41.4 36.0* 38.4* 40.2 38.9*  MCV 96.5  --  95.3 93.9 93.1  PLT 99*  --  120* 159 160   Cardiac Enzymes: No results for input(s): CKTOTAL, CKMB, CKMBINDEX, TROPONINI in the last 168 hours. BNP: Invalid input(s): POCBNP CBG: No results for input(s): GLUCAP in the last 168 hours. D-Dimer No results for input(s): DDIMER in the last 72 hours. Hgb A1c No results for input(s): HGBA1C in the last 72 hours. Lipid Profile No results for input(s): CHOL, HDL, LDLCALC, TRIG, CHOLHDL, LDLDIRECT in the last 72 hours. Thyroid function studies No results for input(s): TSH, T4TOTAL, T3FREE, THYROIDAB in the last 72 hours.  Invalid input(s):  FREET3 Anemia work up No results for input(s): VITAMINB12, FOLATE, FERRITIN, TIBC, IRON, RETICCTPCT in the last 72 hours. Urinalysis    Component Value Date/Time   COLORURINE YELLOW 08/17/2019 0929   APPEARANCEUR CLEAR 08/17/2019 0929   LABSPEC 1.019 08/17/2019 0929   PHURINE 5.0 08/17/2019 0929   GLUCOSEU NEGATIVE 08/17/2019 0929   HGBUR SMALL (A) 08/17/2019 0929   BILIRUBINUR NEGATIVE 08/17/2019 0929   KETONESUR 5 (A) 08/17/2019 0929   PROTEINUR 30 (A) 08/17/2019 0929   UROBILINOGEN 0.2 04/03/2009 1215   NITRITE NEGATIVE 08/17/2019 0929   LEUKOCYTESUR NEGATIVE 08/17/2019 0929   Sepsis Labs Invalid input(s): PROCALCITONIN,  WBC,  LACTICIDVEN Microbiology Recent Results (from the past 240 hour(s))  SARS CORONAVIRUS 2 (TAT 6-24 HRS) Nasopharyngeal Nasopharyngeal Swab     Status: None   Collection Time: 08/17/19 11:12 AM   Specimen: Nasopharyngeal Swab  Result Value Ref Range Status   SARS Coronavirus 2 NEGATIVE NEGATIVE Final    Comment: (NOTE) SARS-CoV-2 target nucleic acids are NOT DETECTED. The SARS-CoV-2 RNA is generally detectable in upper and lower respiratory specimens during the acute phase of infection. Negative results do not preclude SARS-CoV-2 infection, do not rule out co-infections with other pathogens, and should not be used as the sole basis for treatment or other patient management decisions. Negative results must be combined with clinical observations, patient history, and epidemiological information. The expected result is Negative. Fact Sheet for Patients: SugarRoll.be Fact Sheet for Healthcare Providers: https://www.woods-mathews.com/ This test is not yet approved or cleared by the [place] FDA and  has been authorized for detection and/or diagnosis of SARS-CoV-2 by FDA under an Emergency Use Authorization (EUA). This EUA will remain  in effect (meaning this test can be used) for the duration of  the COVID-19 declaration under Section 56 4(b)(1) of the Act, 21 U.S.C. section 360bbb-3(b)(1), unless the authorization is terminated or revoked sooner. Performed at [facility], [place] [address], [place], [place] [address]   Culture, blood (Routine X 2) w Reflex to ID Panel     Status: None (Preliminary result)   Collection Time: 08/17/19  1:30 PM   Specimen: BLOOD LEFT ARM  Result Value Ref Range Status   Specimen Description BLOOD LEFT ARM  Final   Special Requests   Final    BOTTLES DRAWN AEROBIC AND ANAEROBIC Blood Culture adequate volume   Culture   Final    NO GROWTH 4  DAYS Performed at [facility], [place] [address], [place], [place] [address]    Report Status PENDING  Incomplete  Culture, blood (Routine X 2) w Reflex to ID Panel     Status: None (Preliminary result)   Collection Time: 08/17/19  1:50 PM   Specimen: BLOOD LEFT HAND  Result Value Ref Range Status   Specimen Description BLOOD LEFT HAND  Final   Special Requests   Final    BOTTLES DRAWN AEROBIC AND ANAEROBIC Blood Culture results may not be optimal due to an inadequate volume of blood received in culture bottles   Culture   Final    NO GROWTH 4 DAYS Performed at [facility], [place] [address] [place], [place] [address]    Report Status PENDING  Incomplete  Body fluid culture     Status: None   Collection Time: 08/17/19  2:52 PM   Specimen: Synovium; Body Fluid  Result Value Ref Range Status   Specimen Description SYNOVIAL FLUID LEFT KNEE  Final   Special Requests NONE  Final   Gram Stain   Final    ABUNDANT WBC PRESENT, PREDOMINANTLY PMN NO ORGANISMS SEEN    Culture   Final    NO GROWTH 3 DAYS Performed at [facility], [address] [place], [place] [address]    Report Status 08/20/2019 FINAL  Final  MRSA PCR Screening     Status: None   Collection Time: 08/18/19  9:12 AM   Specimen: Nasopharyngeal  Result Value Ref Range Status   MRSA by PCR NEGATIVE NEGATIVE Final     Comment:        The GeneXpert MRSA Assay (FDA approved for NASAL specimens only), is one component of a comprehensive MRSA colonization surveillance program. It is not intended to diagnose MRSA infection nor to guide or monitor treatment for MRSA infections. Performed at [facility], [place] [address] [place], [place] [address]      Time coordinating discharge: Over 30 minutes  SIGNED:   Maysel Mccolm J [place] DO  [facility] 08/22/2019, 9:48 AM

## 2019-08-22 NOTE — Progress Notes (Signed)
Provided discharge education/instructions to Pt's daughter, Caren Griffins, all questions and concerns addressed, Pt not in distress, discharged home with belongings via [place].

## 2019-08-22 NOTE — Progress Notes (Signed)
Occupational Therapy Treatment Patient Details Name: Jesus Beck MRN: [id number] DOB: 08/03/1925 Today's Date: 08/22/2019    History of present illness Jesus Beck is a 86 y.o. male with medical history significant of coronary artery disease status post CABG in 1999, A. fib on Eliquis, hypertension, hyperlipidemia, CKD stage III, heart block status post pacemaker, gout presents to emergency department due to right shoulder pain, left knee pain and acute confusion. S/p left knee aspiration 08/17/2019; working diagnosis of septic arthritis versus gout flare up. L knee and R shoulder x-ray negative for acute abnormality.    OT comments  Pt asleep upon arrival, woke to therapist's voice but quickly fell back asleep. SpO2 88% 2lnc upon arrival. Bumped pt to 3lnc for mobility, SpO2 90%-91% during mobility, DoE 3/4. Discussed O2 with RN who communicated to leave pt on 3lnc. Pt required minA for bed mobility and modA for functional mobility at RW level and modA for LB dressing. Pt will continue to benefit from skilled OT services to maximize safety and independence with ADL/IADL and functional mobility. Will continue to follow acutely and progress as tolerated.    Follow Up Recommendations  Home health OT;Supervision/Assistance - 24 hour    Equipment Recommendations  Wheelchair (measurements OT);Wheelchair cushion (measurements OT);Hospital bed    Recommendations for Other Services PT consult    Precautions / Restrictions Precautions Precautions: Fall;Other (comment) Precaution Comments: watch sats Restrictions Weight Bearing Restrictions: No       Mobility Bed Mobility Overal bed mobility: Needs Assistance Bed Mobility: Supine to Sit     Supine to sit: Min assist;HOB elevated     General bed mobility comments: minA to progress trunk to upright posture  Transfers Overall transfer level: Needs assistance Equipment used: Rolling walker (2 wheeled) Transfers: Sit to/from Stand Sit to  Stand: Mod assist         General transfer comment: cues for hand placement. Assist to power up and stabilize balance    Balance Overall balance assessment: Needs assistance Sitting-balance support: Feet supported;No upper extremity supported Sitting balance-Leahy Scale: Fair Sitting balance - Comments: static sitting balance with minguard assist, intermittently looses balance posteriorly   Standing balance support: Bilateral upper extremity supported;During functional activity Standing balance-Leahy Scale: Poor Standing balance comment: reliant on UE support                            ADL either performed or assessed with clinical judgement   ADL Overall ADL's : Needs assistance/impaired     Grooming: Minimal assistance Grooming Details (indicate cue type and reason): min A for balance              Lower Body Dressing: Moderate assistance;Sit to/from stand Lower Body Dressing Details (indicate cue type and reason): pt able to don/doff socks in recliner Toilet Transfer: Moderate assistance;Ambulation;RW Toilet Transfer Details (indicate cue type and reason): modA to powerup into standing, cues for hand placement, and assistance for RW management Toileting- Clothing Manipulation and Hygiene: Moderate assistance;Sit to/from stand       Functional mobility during ADLs: Moderate assistance;Rolling walker General ADL Comments: pt limited by decreased strength, decreased stability and decreased activity tolerance     Vision       Perception     Praxis      Cognition Arousal/Alertness: Awake/alert Behavior During Therapy: WFL for tasks assessed/performed Overall Cognitive Status: No family/caregiver present to determine baseline cognitive functioning Area of Impairment: Memory;Awareness;Problem solving;Safety/judgement;Orientation  Orientation Level: Disoriented to;Situation;Time Current Attention Level: Sustained Memory: Decreased  short-term memory   Safety/Judgement: Decreased awareness of safety Awareness: Emergent Problem Solving: Difficulty sequencing;Requires verbal cues General Comments: pt required frequent and consistent cues during mobility;pt with slow processing        Exercises     Shoulder Instructions       General Comments SpO2 88% upon arrival, pt on 2lnc, with pursed lip breathing SpO2 89%, bumped pt up to 3lnc for mobiltiy, SpO2 90%-91% 3lnc, DOE 3/4; RN notified and communicated to leave pt on 3lnc    Pertinent Vitals/ Pain       Pain Assessment: Faces Faces Pain Scale: Hurts a little bit Pain Location: generalized discomfort  Pain Descriptors / Indicators: Guarding Pain Intervention(s): Monitored during session;Limited activity within patient's tolerance  Home Living                                          Prior Functioning/Environment              Frequency  Min 2X/week        Progress Toward Goals  OT Goals(current goals can now be found in the care plan section)  Progress towards OT goals: Progressing toward goals  Acute Rehab OT Goals Patient Stated Goal: home OT Goal Formulation: With patient Time For Goal Achievement: 09/01/19 Potential to Achieve Goals: Good ADL Goals Pt Will Perform Grooming: with min guard assist;standing Pt Will Perform Lower Body Dressing: with min guard assist;sit to/from stand Pt Will Transfer to Toilet: with min guard assist;bedside commode;ambulating Pt Will Perform Toileting - Clothing Manipulation and hygiene: with min guard assist;sitting/lateral leans;sit to/from stand Additional ADL Goal #1: Pt will manage RW during ADLs with Min cues  Plan Discharge plan remains appropriate    Co-evaluation                 AM-PAC OT "6 Clicks" Daily Activity     Outcome Measure   Help from another person eating meals?: A Little Help from another person taking care of personal grooming?: A Little Help from  another person toileting, which includes using toliet, bedpan, or urinal?: A Lot Help from another person bathing (including washing, rinsing, drying)?: A Lot Help from another person to put on and taking off regular upper body clothing?: A Lot Help from another person to put on and taking off regular lower body clothing?: A Lot 6 Click Score: 14    End of Session Equipment Utilized During Treatment: Rolling walker;Oxygen;Gait belt  OT Visit Diagnosis: Unsteadiness on feet (R26.81);Other abnormalities of gait and mobility (R26.89);Muscle weakness (generalized) (M62.81);Pain Pain - part of body: (generalized )   Activity Tolerance Patient limited by fatigue   Patient Left with call bell/phone within reach;in chair;with chair alarm set   Nurse Communication Mobility status        Time: [id number] OT Time Calculation (min): 18 min  Charges: OT General Charges $OT Visit: 1 Visit OT Treatments $Self Care/Home Management : 8-22 mins  Helene Kelp OTR/L Acute Rehabilitation Services Office: [place] 08/22/2019, 10:58 AM

## 2019-08-23 DIAGNOSIS — I10 Essential (primary) hypertension: Secondary | ICD-10-CM | POA: Diagnosis not present

## 2019-08-28 DIAGNOSIS — N179 Acute kidney failure, unspecified: Secondary | ICD-10-CM | POA: Diagnosis not present

## 2019-08-28 DIAGNOSIS — J189 Pneumonia, unspecified organism: Secondary | ICD-10-CM | POA: Diagnosis not present

## 2019-08-28 DIAGNOSIS — J9601 Acute respiratory failure with hypoxia: Secondary | ICD-10-CM | POA: Diagnosis not present

## 2019-08-28 DIAGNOSIS — M109 Gout, unspecified: Secondary | ICD-10-CM | POA: Diagnosis not present

## 2019-08-28 DIAGNOSIS — G9341 Metabolic encephalopathy: Secondary | ICD-10-CM | POA: Diagnosis not present

## 2019-09-01 ENCOUNTER — Inpatient Hospital Stay (HOSPITAL_COMMUNITY)
Admission: EM | Admit: 2019-09-01 | Discharge: 2019-09-08 | DRG: 291 | Disposition: A | Discharge status: Hospice | Payer: Medicare Other | Attending: Internal Medicine | Admitting: Internal Medicine

## 2019-09-01 ENCOUNTER — Other Ambulatory Visit: Payer: Self-pay

## 2019-09-01 ENCOUNTER — Emergency Department (HOSPITAL_COMMUNITY): Payer: Medicare Other

## 2019-09-01 ENCOUNTER — Encounter (HOSPITAL_COMMUNITY): Payer: Self-pay | Admitting: Internal Medicine

## 2019-09-01 DIAGNOSIS — Z8249 Family history of ischemic heart disease and other diseases of the circulatory system: Secondary | ICD-10-CM | POA: Diagnosis not present

## 2019-09-01 DIAGNOSIS — Z951 Presence of aortocoronary bypass graft: Secondary | ICD-10-CM | POA: Diagnosis not present

## 2019-09-01 DIAGNOSIS — D72829 Elevated white blood cell count, unspecified: Secondary | ICD-10-CM

## 2019-09-01 DIAGNOSIS — Z9049 Acquired absence of other specified parts of digestive tract: Secondary | ICD-10-CM | POA: Diagnosis not present

## 2019-09-01 DIAGNOSIS — R630 Anorexia: Secondary | ICD-10-CM | POA: Diagnosis present

## 2019-09-01 DIAGNOSIS — E785 Hyperlipidemia, unspecified: Secondary | ICD-10-CM | POA: Diagnosis present

## 2019-09-01 DIAGNOSIS — J189 Pneumonia, unspecified organism: Secondary | ICD-10-CM | POA: Diagnosis not present

## 2019-09-01 DIAGNOSIS — J9601 Acute respiratory failure with hypoxia: Secondary | ICD-10-CM | POA: Diagnosis not present

## 2019-09-01 DIAGNOSIS — I25119 Atherosclerotic heart disease of native coronary artery with unspecified angina pectoris: Secondary | ICD-10-CM | POA: Diagnosis not present

## 2019-09-01 DIAGNOSIS — I350 Nonrheumatic aortic (valve) stenosis: Secondary | ICD-10-CM | POA: Diagnosis not present

## 2019-09-01 DIAGNOSIS — I5033 Acute on chronic diastolic (congestive) heart failure: Secondary | ICD-10-CM | POA: Diagnosis present

## 2019-09-01 DIAGNOSIS — I480 Paroxysmal atrial fibrillation: Secondary | ICD-10-CM | POA: Diagnosis not present

## 2019-09-01 DIAGNOSIS — I4891 Unspecified atrial fibrillation: Secondary | ICD-10-CM | POA: Diagnosis not present

## 2019-09-01 DIAGNOSIS — I442 Atrioventricular block, complete: Secondary | ICD-10-CM | POA: Diagnosis present

## 2019-09-01 DIAGNOSIS — I130 Hypertensive heart and chronic kidney disease with heart failure and stage 1 through stage 4 chronic kidney disease, or unspecified chronic kidney disease: Principal | ICD-10-CM | POA: Diagnosis present

## 2019-09-01 DIAGNOSIS — E876 Hypokalemia: Secondary | ICD-10-CM | POA: Diagnosis not present

## 2019-09-01 DIAGNOSIS — T502X5A Adverse effect of carbonic-anhydrase inhibitors, benzothiadiazides and other diuretics, initial encounter: Secondary | ICD-10-CM | POA: Diagnosis not present

## 2019-09-01 DIAGNOSIS — Z66 Do not resuscitate: Secondary | ICD-10-CM | POA: Diagnosis present

## 2019-09-01 DIAGNOSIS — R531 Weakness: Secondary | ICD-10-CM | POA: Diagnosis not present

## 2019-09-01 DIAGNOSIS — I739 Peripheral vascular disease, unspecified: Secondary | ICD-10-CM | POA: Diagnosis present

## 2019-09-01 DIAGNOSIS — H9190 Unspecified hearing loss, unspecified ear: Secondary | ICD-10-CM | POA: Diagnosis present

## 2019-09-01 DIAGNOSIS — Z950 Presence of cardiac pacemaker: Secondary | ICD-10-CM | POA: Diagnosis not present

## 2019-09-01 DIAGNOSIS — I4821 Permanent atrial fibrillation: Secondary | ICD-10-CM | POA: Diagnosis present

## 2019-09-01 DIAGNOSIS — R17 Unspecified jaundice: Secondary | ICD-10-CM | POA: Diagnosis not present

## 2019-09-01 DIAGNOSIS — Z20822 Contact with and (suspected) exposure to covid-19: Secondary | ICD-10-CM | POA: Diagnosis not present

## 2019-09-01 DIAGNOSIS — R0602 Shortness of breath: Secondary | ICD-10-CM | POA: Diagnosis not present

## 2019-09-01 DIAGNOSIS — Z79899 Other long term (current) drug therapy: Secondary | ICD-10-CM

## 2019-09-01 DIAGNOSIS — J9621 Acute and chronic respiratory failure with hypoxia: Secondary | ICD-10-CM | POA: Diagnosis present

## 2019-09-01 DIAGNOSIS — R7989 Other specified abnormal findings of blood chemistry: Secondary | ICD-10-CM | POA: Diagnosis not present

## 2019-09-01 DIAGNOSIS — I2510 Atherosclerotic heart disease of native coronary artery without angina pectoris: Secondary | ICD-10-CM | POA: Diagnosis present

## 2019-09-01 DIAGNOSIS — N390 Urinary tract infection, site not specified: Secondary | ICD-10-CM | POA: Diagnosis present

## 2019-09-01 DIAGNOSIS — I361 Nonrheumatic tricuspid (valve) insufficiency: Secondary | ICD-10-CM | POA: Diagnosis not present

## 2019-09-01 DIAGNOSIS — I340 Nonrheumatic mitral (valve) insufficiency: Secondary | ICD-10-CM | POA: Diagnosis not present

## 2019-09-01 DIAGNOSIS — Z888 Allergy status to other drugs, medicaments and biological substances status: Secondary | ICD-10-CM

## 2019-09-01 DIAGNOSIS — R0902 Hypoxemia: Secondary | ICD-10-CM | POA: Diagnosis not present

## 2019-09-01 DIAGNOSIS — R918 Other nonspecific abnormal finding of lung field: Secondary | ICD-10-CM | POA: Diagnosis not present

## 2019-09-01 DIAGNOSIS — R627 Adult failure to thrive: Secondary | ICD-10-CM | POA: Diagnosis present

## 2019-09-01 DIAGNOSIS — Z87891 Personal history of nicotine dependence: Secondary | ICD-10-CM

## 2019-09-01 DIAGNOSIS — N1832 Chronic kidney disease, stage 3b: Secondary | ICD-10-CM | POA: Diagnosis present

## 2019-09-01 DIAGNOSIS — I10 Essential (primary) hypertension: Secondary | ICD-10-CM | POA: Diagnosis present

## 2019-09-01 DIAGNOSIS — I4510 Unspecified right bundle-branch block: Secondary | ICD-10-CM | POA: Diagnosis present

## 2019-09-01 DIAGNOSIS — M109 Gout, unspecified: Secondary | ICD-10-CM | POA: Diagnosis present

## 2019-09-01 DIAGNOSIS — Z9981 Dependence on supplemental oxygen: Secondary | ICD-10-CM

## 2019-09-01 DIAGNOSIS — J9691 Respiratory failure, unspecified with hypoxia: Secondary | ICD-10-CM | POA: Diagnosis present

## 2019-09-01 DIAGNOSIS — R0689 Other abnormalities of breathing: Secondary | ICD-10-CM | POA: Diagnosis not present

## 2019-09-01 DIAGNOSIS — Z7901 Long term (current) use of anticoagulants: Secondary | ICD-10-CM | POA: Diagnosis not present

## 2019-09-01 DIAGNOSIS — E7800 Pure hypercholesterolemia, unspecified: Secondary | ICD-10-CM | POA: Diagnosis present

## 2019-09-01 DIAGNOSIS — R069 Unspecified abnormalities of breathing: Secondary | ICD-10-CM | POA: Diagnosis not present

## 2019-09-01 DIAGNOSIS — Z6821 Body mass index (BMI) 21.0-21.9, adult: Secondary | ICD-10-CM

## 2019-09-01 LAB — URINALYSIS, ROUTINE W REFLEX MICROSCOPIC
Bacteria, UA: NONE SEEN
Bilirubin Urine: NEGATIVE
Glucose, UA: NEGATIVE mg/dL
Hgb urine dipstick: NEGATIVE
Ketones, ur: NEGATIVE mg/dL
Leukocytes,Ua: NEGATIVE
Nitrite: NEGATIVE
Protein, ur: 30 mg/dL — AB
Specific Gravity, Urine: 1.016 (ref 1.005–1.030)
pH: 5 (ref 5.0–8.0)

## 2019-09-01 LAB — COMPREHENSIVE METABOLIC PANEL
ALT: 26 U/L (ref 0–44)
AST: 21 U/L (ref 15–41)
Albumin: 3.1 g/dL — ABNORMAL LOW (ref 3.5–5.0)
Alkaline Phosphatase: 84 U/L (ref 38–126)
Anion gap: 15 (ref 5–15)
BUN: 32 mg/dL — ABNORMAL HIGH (ref 8–23)
CO2: 24 mmol/L (ref 22–32)
Calcium: 9.1 mg/dL (ref 8.9–10.3)
Chloride: 101 mmol/L (ref 98–111)
Creatinine, Ser: 1.39 mg/dL — ABNORMAL HIGH (ref 0.61–1.24)
GFR calc Af Amer: 51 mL/min — ABNORMAL LOW (ref >60)
GFR calc non Af Amer: 44 mL/min — ABNORMAL LOW (ref >60)
Glucose, Bld: 126 mg/dL — ABNORMAL HIGH (ref 70–99)
Potassium: 4.3 mmol/L (ref 3.5–5.1)
Sodium: 140 mmol/L (ref 135–145)
Total Bilirubin: 2.1 mg/dL — ABNORMAL HIGH (ref 0.3–1.2)
Total Protein: 6.7 g/dL (ref 6.5–8.1)

## 2019-09-01 LAB — CBC WITH DIFFERENTIAL/PLATELET
Abs Immature Granulocytes: 0.16 10*3/uL — ABNORMAL HIGH (ref 0.00–0.07)
Basophils Absolute: 0 10*3/uL (ref 0.0–0.1)
Basophils Relative: 0 %
Eosinophils Absolute: 0.1 10*3/uL (ref 0.0–0.5)
Eosinophils Relative: 0 %
HCT: 44.1 % (ref 39.0–52.0)
Hemoglobin: 14.2 g/dL (ref 13.0–17.0)
Immature Granulocytes: 1 %
Lymphocytes Relative: 4 %
Lymphs Abs: 0.7 10*3/uL (ref 0.7–4.0)
MCH: 30.7 pg (ref 26.0–34.0)
MCHC: 32.2 g/dL (ref 30.0–36.0)
MCV: 95.2 fL (ref 80.0–100.0)
Monocytes Absolute: 0.9 10*3/uL (ref 0.1–1.0)
Monocytes Relative: 6 %
Neutro Abs: 14.6 10*3/uL — ABNORMAL HIGH (ref 1.7–7.7)
Neutrophils Relative %: 89 %
Platelets: 263 10*3/uL (ref 150–400)
RBC: 4.63 MIL/uL (ref 4.22–5.81)
RDW: 14.1 % (ref 11.5–15.5)
WBC: 16.5 10*3/uL — ABNORMAL HIGH (ref 4.0–10.5)
nRBC: 0 % (ref 0.0–0.2)

## 2019-09-01 LAB — BRAIN NATRIURETIC PEPTIDE: B Natriuretic Peptide: 860.4 pg/mL — ABNORMAL HIGH (ref 0.0–100.0)

## 2019-09-01 LAB — RESPIRATORY PANEL BY RT PCR (FLU A&B, COVID)
Influenza A by PCR: NEGATIVE
Influenza B by PCR: NEGATIVE
SARS Coronavirus 2 by RT PCR: NEGATIVE

## 2019-09-01 LAB — TROPONIN I (HIGH SENSITIVITY)
Troponin I (High Sensitivity): 90 ng/L — ABNORMAL HIGH (ref <18)
Troponin I (High Sensitivity): 98 ng/L — ABNORMAL HIGH (ref <18)

## 2019-09-01 LAB — LACTIC ACID, PLASMA
Lactic Acid, Venous: 1.9 mmol/L (ref 0.5–1.9)
Lactic Acid, Venous: 1.6 mmol/L (ref 0.5–1.9)

## 2019-09-01 MED ORDER — FUROSEMIDE 10 MG/ML IJ SOLN
40.0000 mg | Freq: Once | INTRAMUSCULAR | Status: AC
  Administered 2019-09-01: 21:00:00 40 mg via INTRAVENOUS
  Filled 2019-09-01: qty 4

## 2019-09-01 MED ORDER — VANCOMYCIN HCL IN DEXTROSE 1-5 GM/200ML-% IV SOLN
1000.0000 mg | Freq: Once | INTRAVENOUS | Status: AC
  Administered 2019-09-01: 1000 mg via INTRAVENOUS
  Filled 2019-09-01 (×2): qty 200

## 2019-09-01 MED ORDER — PIPERACILLIN-TAZOBACTAM 3.375 G IVPB
3.3750 g | Freq: Once | INTRAVENOUS | Status: AC
  Administered 2019-09-01: 22:00:00 3.375 g via INTRAVENOUS
  Filled 2019-09-01 (×2): qty 50

## 2019-09-01 NOTE — ED Triage Notes (Signed)
Pt presents from home with daughter and home health with EMS for shob x3 days. On 2L at home since last hospitalization, 70s spO2 upon EMS arrival, improved only to 85% with etCO2 23 with NRB 15L.  EMS exam: RR40, 150SBP, paced at 60bpm, 66F oral, 250cc given by ems.   H/o recent kidney infxn

## 2019-09-01 NOTE — H&P (Signed)
History and Physical    Jesus Beck [id number] DOB: [birth year] DOA: 09/01/2019  PCP: Seward Carol, MD  Patient coming from: Home  Chief Complaint: SOB  HPI: Jesus Beck is a 86 y.o. male with medical history significant of CKD, HLD, HTN, Carotid stenosis s/p bilateral endarterectomy, CAD, permanent Afib with PCM in place who presents for acute SOB.  Spoke with his daughter Jesus Beck for more information. Per report, patient started to develop worsening SOB around 4pm today.  He was working with occupational therapy when she noticed that his RR was worse.  They checked a temperature and it was elevated and his oxygen level was 78%.  EMS was called and he was placed on a NRB with improvement only to the 80s.  When he presented to the ED, he had oxygen increased and Pox improved to the low 90s.  His daughter Jesus Beck notes that she also remembered that she was up with him at 2am this morning.  At that time, he was struggling to get up from the restroom.  He had labored breathing and O2 had decreased as well.  She sat with him for an hour to control his breathing and his Pulse Ox improved.  He was his normal self this morning until OT.  He notes a cough which is nonproductive.  Difficulty laying flat.  His BP has been mildly higher at home and he is using oxygen at home.   He was recently admitted to the hospital 08/17/19 - 08/22/19 for gout attack and pneumonia.  He was sent home on oxygen, prednisone and antibiotics.  He had AKI during that time as well.   ED Course: In the ED, he was found to be hypoxic needing a NRB mask.   He was noted to have an elevated TnI to 90, but with a flate trend.  His Renal function was at his baseline.  BNP was 860.  WBC was 16 with a neutrophil predominance.  CXR showed vascular congestion and pleural effusion on the left.  EKG showed Vpaced rhythm.    Review of Systems: As per HPI otherwise all other systems reviewed and are negative.  Past Medical History:    Diagnosis Date  . Atrial fibrillation (HCC)    paroxysmal atrial fibrillation- stopped sotolol due to HR 44 (on 40mg  BID)  . Atrial fibrillation ([facility])   . Back pain   . CAD (coronary artery disease)    s/p CABG 1999 with LIMA to MAD, SVG to first diagonal, SVG to RCA and PCI with DES mid LAD 06/09 and residual 70% D2 not amenable to PCI due to small caliber Cardiology Dr. Marlou Porch  . Carotid artery stenosis     s/p bialteral CEA's 2004 with persistent bruit on left and minimal plaque by carotid dopplers 2010  . Dyslipidemia   . HTN (hypertension)   . Hypercholesteremia   . Hypersomnia   . RBBB   . Renal insufficiency     Past Surgical History:  Procedure Laterality Date  . CAROTID ENDARTERECTOMY Bilateral   . CHOLECYSTECTOMY    . CORONARY ARTERY BYPASS GRAFT    . EP IMPLANTABLE DEVICE N/A 08/27/2014   Procedure: PACEMAKER IMPLANT;  Surgeon: Belva Crome, MD;  Location: [facility] LAB;  Service: Cardiovascular;  Laterality: N/A;  . HERNIA REPAIR    . INTRAOCULAR LENS INSERTION    . PERMANENT PACEMAKER INSERTION N/A 08/27/2014   Procedure: Daneil Dan;  Surgeon: Belva Crome, MD;  Location: [facility] CATH LAB;  Service: Cardiovascular;  Laterality: N/A;  . PERMANENT PACEMAKER INSERTION N/A 08/27/2014   Procedure: PERMANENT PACEMAKER INSERTION;  Surgeon: Evans Lance, MD;  Location: [facility] CATH LAB;  Service: Cardiovascular;  Laterality: N/A;    Social History  reports that he quit smoking about 61 years ago. He has never used smokeless tobacco. He reports that he does not drink alcohol or use drugs.  Allergies  Allergen Reactions  . Lipitor [Atorvastatin] Hives    Family History  Problem Relation Age of Onset  . Diabetes Mother   . Heart disease Father     Prior to Admission medications   Medication Sig Start Date End Date Taking? Authorizing Provider  amLODipine (NORVASC) 10 MG tablet Take 1 tablet (10 mg total) by mouth daily. 08/23/19 11/21/19 Yes [place] Eric J, DO  COLCRYS 0.6 MG  tablet Take 0.6 mg by mouth daily. 08/01/19  Yes [provider]  ELIQUIS 2.5 MG TABS tablet TAKE 1 TABLET(2.5 MG) BY MOUTH TWICE DAILY Patient taking differently: Take 2.5 mg by mouth 2 (two) times daily.  07/04/19  Yes Jerline Pain, MD  isosorbide mononitrate (IMDUR) 60 MG 24 hr tablet TAKE 1 AND 1/2 TABLETS BY MOUTH DAILY Patient taking differently: Take 90 mg by mouth in the morning and at bedtime.  08/16/19  Yes Jerline Pain, MD  nitroGLYCERIN (NITROSTAT) 0.4 MG SL tablet Place 1 tablet (0.4 mg total) under the tongue every 5 (five) minutes as needed for chest pain. 01/27/17  Yes Jerline Pain, MD  Omega-3 Fatty Acids (OMEGA 3 PO) Take 2,000 Units by mouth 2 (two) times daily.    Yes [provider]  simvastatin (ZOCOR) 20 MG tablet TAKE 1 TABLET BY MOUTH EVERY NIGHT AT BEDTIME Patient taking differently: Take 20 mg by mouth daily.    Yes Jerline Pain, MD  traMADol (ULTRAM) 50 MG tablet Take 50 mg by mouth daily as needed (pain).   Yes [provider]    Physical Exam: Vitals:   09/01/19 2130 09/01/19 2145 09/01/19 2215 09/01/19 2245  BP: (!) 152/64 (!) 158/79 (!) 169/72 (!) 150/71  Pulse: 62 85 62 68  Resp: (!) 30 (!) 38 (!) 37 (!) 38  Temp:      TempSrc:      SpO2: 93% 93% 96% 91%  Weight:      Height:        Constitutional: Elderly man, sitting up in bed, increased respiratory rate Eyes:  lids and conjunctivae normal ENMT: MM are moist Neck: normal, supple, Unable to assess JVD given NRB in place Respiratory: Decreased breath sounds right base, crackles at bilateral bases, + expiratory wheezing, saturations dropped to the 70s with sitting up. 2+ edema to mid calf, pulses intact in DP Cardiovascular: Bradycardic, normal rhythm, no murmur Abdomen: flat, +BS, NT Musculoskeletal: no contractures.  Normal muscle tone for age.  Skin: Chronic skin changes on face and legs Neurologic: Grossly intact, no asymmetric changes Psychiatric: Distressed due to  breathing difficulty.    Labs on Admission: I have personally reviewed following labs and imaging studies  CBC: Recent Labs  Lab 09/01/19 1841  WBC 16.5*  NEUTROABS 14.6*  HGB 14.2  HCT 44.1  MCV 95.2  PLT 99991111    Basic Metabolic Panel: Recent Labs  Lab 09/01/19 1841  NA 140  K 4.3  CL 101  CO2 24  GLUCOSE 126*  BUN 32*  CREATININE 1.39*  CALCIUM 9.1    GFR: Estimated Creatinine Clearance: 33.5  mL/min (A) (by C-G formula based on SCr of 1.39 mg/dL (H)).  Liver Function Tests: Recent Labs  Lab 09/01/19 1841  AST 21  ALT 26  ALKPHOS 84  BILITOT 2.1*  PROT 6.7  ALBUMIN 3.1*    Urine analysis:    Component Value Date/Time   COLORURINE YELLOW 09/01/2019 1841   APPEARANCEUR CLEAR 09/01/2019 1841   LABSPEC 1.016 09/01/2019 1841   PHURINE 5.0 09/01/2019 1841   GLUCOSEU NEGATIVE 09/01/2019 1841   HGBUR NEGATIVE 09/01/2019 1841   BILIRUBINUR NEGATIVE 09/01/2019 1841   KETONESUR NEGATIVE 09/01/2019 1841   PROTEINUR 30 (A) 09/01/2019 1841   UROBILINOGEN 0.2 04/03/2009 1215   NITRITE NEGATIVE 09/01/2019 1841   LEUKOCYTESUR NEGATIVE 09/01/2019 1841    Radiological Exams on Admission: DG Chest Port 1 View  Result Date: 09/01/2019 CLINICAL DATA:  Shortness of breath EXAM: PORTABLE CHEST 1 VIEW COMPARISON:  08/20/2019 FINDINGS: Cardiac shadow is stable. Postsurgical changes are noted. Pacemaker is again noted and stable. Aortic calcifications are again seen. Elevation of the right hemidiaphragm is noted. Small left pleural effusion and left basilar atelectasis is noted. Mild vascular congestion remains. No bony abnormality is seen. IMPRESSION: Mild vascular congestion with small left pleural effusion left basilar atelectasis. Electronically Signed   By: Inez Catalina M.D.   On: 09/01/2019 18:57    EKG: Independently reviewed. V paced rhythm  Assessment/Plan Acute on chronic Respiratory failure with hypoxia - He returned home from the hospital previously on oxygen  and now is requiring a higher level.  He has pulmonary edema on CXR and on exam has crackles - IV lasix 40mg  BID - Oxygen to keep sats > 90 - Nebulizer treatments - TTE  Fever, leukocytosis - Unclear cause for continued leukocytosis and fever - Possibly did not complete Abx?  Daughter reports he manages his own medications - Trend fever curve - Follow BC - He received Zosyn and vancomycin in the ED.  Will hold Abx and monitor for improvement with lasix. Low threshold to restart Abx    CAD (coronary artery disease) - Continue imdur, eliquis, statin - No complaint of chest pain - Troponin high at 90, but flat - Trend if complains of chest pain    HTN (hypertension)    Atrial fibrillation with slow ventricular response (HCC) CHB (complete heart block) (HCC) Pacemaker - Monitor on telemetry    CKD - Renal function at baseline, monitor  Gout - continue daily colchicine    DVT prophylaxis: Eliquis  Code Status:   Full Family Communication:  Daughter Jesus Beck on the phone  Disposition Plan:   Patient is from:  Home  Anticipated DC to:  TBD  Anticipated DC date:  TBD  Anticipated DC barriers: Oxygen demands, acute illness  Consults called:  None  Admission status:  Progressive, INP   Severity of Illness: The appropriate patient status for this patient is INPATIENT. Inpatient status is judged to be reasonable and necessary in order to provide the required intensity of service to ensure the patient's safety. The patient's presenting symptoms, physical exam findings, and initial radiographic and laboratory data in the context of their chronic comorbidities is felt to place them at high risk for further clinical deterioration. Furthermore, it is not anticipated that the patient will be medically stable for discharge from the hospital within 2 midnights of admission. The following factors support the patient status of inpatient.   " The patient's presenting symptoms include  hypoxia. " The worrisome physical exam findings include SOB. "  The initial radiographic and laboratory data are worrisome because of pulmonary edema. " The chronic co-morbidities include Afib, CAD, HTN, CKD.   * I certify that at the point of admission it is my clinical judgment that the patient will require inpatient hospital care spanning beyond 2 midnights from the point of admission due to high intensity of service, high risk for further deterioration and high frequency of surveillance required.Gilles Chiquito MD [facility]  How to contact the [facility] Attending or Consulting provider [place] or covering provider during after hours [place], for this patient?   1. Check the care team in [facility] and look for a) attending/consulting [facility] provider listed and b) the [facility] team listed 2. Log into [email or web address] and use [place]'s universal password to access. If you do not have the password, please contact the hospital operator. 3. Locate the [facility] provider you are looking for under [facility] and page to a number that you can be directly reached. 4. If you still have difficulty reaching the provider, please page the [facility] (Director on Call) for the Hospitalists listed on amion for assistance.  09/01/2019, 11:34 PM

## 2019-09-01 NOTE — ED Provider Notes (Signed)
[facility] EMERGENCY DEPARTMENT Provider Note   CSN: [id number] Arrival date & time:        History Chief Complaint  Patient presents with  . Shortness of Breath    Jesus Beck is a 86 y.o. male.  Patient is a 86 year old male with extensive past medical history including coronary artery disease with CABG, peripheral artery disease status post CEA, atrial fibrillation, hypertension, chronic renal insufficiency, and recent admission for gout of the knee/urinary tract infection.  Patient was discharged on home oxygen.  Early this morning, he began to experience worsening shortness of breath which has progressed throughout the day.  Patient has had an increased oxygen requirement during this period of time.  He is somewhat difficult to get a history from secondary to his being hard of hearing, but he denies to me he is having fever, cough, or chest pain.  The history is provided by the patient.  Shortness of Breath Severity:  Moderate Onset quality:  Sudden Duration:  12 hours Timing:  Constant Progression:  Worsening Chronicity:  New Relieved by:  Nothing Worsened by:  Nothing Ineffective treatments:  None tried Associated symptoms: no cough and no fever        Past Medical History:  Diagnosis Date  . Atrial fibrillation (HCC)    paroxysmal atrial fibrillation- stopped sotolol due to HR 44 (on 40mg  BID)  . Atrial fibrillation ([place])   . Back pain   . CAD (coronary artery disease)    s/p CABG 1999 with LIMA to MAD, SVG to first diagonal, SVG to RCA and PCI with DES mid LAD 06/09 and residual 70% D2 not amenable to PCI due to small caliber Cardiology Dr. Marlou Porch  . Carotid artery stenosis     s/p bialteral CEA's 2004 with persistent bruit on left and minimal plaque by carotid dopplers 2010  . Dyslipidemia   . HTN (hypertension)   . Hypercholesteremia   . Hypersomnia   . RBBB   . Renal insufficiency     Patient Active Problem List   Diagnosis Date  Noted  . Chronic respiratory insufficiency 08/22/2019  . AMS (altered mental status) 08/17/2019  . Thrombocytopenia ([place]) 08/17/2019  . Acute on chronic kidney failure ([place]) 08/17/2019  . Joint pain 08/17/2019  . Acute hypoxemic respiratory failure ([employer]) 08/17/2019  . Pacemaker 11/26/2014  . Atrial fibrillation with slow ventricular response ([place]) 08/27/2014  . Symptomatic bradycardia 08/27/2014  . CHB (complete heart block) ([place]) 08/27/2014  . Atrial fibrillation ([place])   . CAD (coronary artery disease)   . Carotid artery stenosis   . RBBB   . HTN (hypertension)   . Hypercholesteremia     Past Surgical History:  Procedure Laterality Date  . CAROTID ENDARTERECTOMY Bilateral   . CHOLECYSTECTOMY    . CORONARY ARTERY BYPASS GRAFT    . EP IMPLANTABLE DEVICE N/A 08/27/2014   Procedure: PACEMAKER IMPLANT;  Surgeon: Belva Crome, MD;  Location: [facility] CATH LAB;  Service: Cardiovascular;  Laterality: N/A;  . HERNIA REPAIR    . INTRAOCULAR LENS INSERTION    . PERMANENT PACEMAKER INSERTION N/A 08/27/2014   Procedure: Daneil Dan;  Surgeon: Belva Crome, MD;  Location: [facility] CATH LAB;  Service: Cardiovascular;  Laterality: N/A;  . PERMANENT PACEMAKER INSERTION N/A 08/27/2014   Procedure: PERMANENT PACEMAKER INSERTION;  Surgeon: Evans Lance, MD;  Location: [facility] CATH LAB;  Service: Cardiovascular;  Laterality: N/A;       Family History  Problem Relation Age of  Onset  . Diabetes Mother   . Heart disease Father     Social History   Tobacco Use  . Smoking status: Never Smoker  . Smokeless tobacco: Never Used  Substance Use Topics  . Alcohol use: No  . Drug use: No    Home Medications Prior to Admission medications   Medication Sig Start Date End Date Taking? Authorizing Provider  amLODipine (NORVASC) 10 MG tablet Take 1 tablet (10 mg total) by mouth daily. 08/23/19 11/21/19  [place] Eric J, DO  COLCRYS 0.6 MG tablet Take 0.6 mg by mouth daily. 08/01/19   [provider]  ELIQUIS  2.5 MG TABS tablet TAKE 1 TABLET(2.5 MG) BY MOUTH TWICE DAILY Patient taking differently: Take 2.5 mg by mouth 2 (two) times daily.  07/04/19   Jerline Pain, MD  furosemide (LASIX) 20 MG tablet Take 20 mg by mouth daily.     [provider]  isosorbide mononitrate (IMDUR) 60 MG 24 hr tablet TAKE 1 AND 1/2 TABLETS BY MOUTH DAILY Patient taking differently: Take 90 mg by mouth daily. TAKE 1 AND 1/2 TABLETS BY MOUTH DAILY 08/16/19   Jerline Pain, MD  nitroGLYCERIN (NITROSTAT) 0.4 MG SL tablet Place 1 tablet (0.4 mg total) under the tongue every 5 (five) minutes as needed for chest pain. 01/27/17   Jerline Pain, MD  Omega-3 Fatty Acids (OMEGA 3 PO) Take 2,000 Units by mouth 2 (two) times daily.     [provider]  simvastatin (ZOCOR) 20 MG tablet TAKE 1 TABLET BY MOUTH EVERY NIGHT AT BEDTIME Patient taking differently: Take 20 mg by mouth daily at 6 PM.     Jerline Pain, MD  traMADol (ULTRAM) 50 MG tablet Take 50 mg by mouth daily as needed (pain).    [provider]    Allergies    Lipitor [atorvastatin]  Review of Systems   Review of Systems  Constitutional: Negative for fever.  Respiratory: Positive for shortness of breath. Negative for cough.   All other systems reviewed and are negative.   Physical Exam Updated Vital Signs Resp 19   SpO2 (!) 85% Comment: on NRB  Physical Exam Vitals and nursing note reviewed.  Constitutional:      General: He is not in acute distress.    Appearance: He is well-developed. He is not diaphoretic.  HENT:     Head: Normocephalic and atraumatic.  Cardiovascular:     Rate and Rhythm: Normal rate and regular rhythm.     Heart sounds: No murmur. No friction rub.  Pulmonary:     Effort: Pulmonary effort is normal. No respiratory distress.     Breath sounds: Normal breath sounds. No wheezing or rales.  Abdominal:     General: Bowel sounds are normal. There is no distension.     Palpations: Abdomen is soft.      Tenderness: There is no abdominal tenderness.  Musculoskeletal:        General: Normal range of motion.     Cervical back: Normal range of motion and neck supple.     Right lower leg: Edema present.     Left lower leg: Edema present.     Comments: There is 1-2+ pitting edema of both lower extremities.  Skin:    General: Skin is warm and dry.  Neurological:     Mental Status: He is alert and oriented to person, place, and time.     Coordination: Coordination normal.     ED  Results / Procedures / Treatments   Labs (all labs ordered are listed, but only abnormal results are displayed) Labs Reviewed  CBC WITH DIFFERENTIAL/PLATELET  BRAIN NATRIURETIC PEPTIDE  COMPREHENSIVE METABOLIC PANEL  URINALYSIS, ROUTINE W REFLEX MICROSCOPIC  TROPONIN I (HIGH SENSITIVITY)    EKG EKG Interpretation  Date/Time:  Friday September 01 2019 18:43:34 EDT Ventricular Rate:  61 PR Interval:    QRS Duration: 187 QT Interval:  503 QTC Calculation: 507 R Axis:   -65 Text Interpretation: Ventricular Paced Rhythm Left bundle branch block Baseline wander in lead(s) V2 Confirmed by Veryl Speak [phone]) on 09/01/2019 7:03:03 PM   Radiology No results found.  Procedures Procedures (including critical care time)  Medications Ordered in ED Medications - No data to display  ED Course  I have reviewed the triage vital signs and the nursing notes.  Pertinent labs & imaging results that were available during my care of the patient were reviewed by me and considered in my medical decision making (see chart for details).    MDM Rules/Calculators/A&P  Patient is a 86 year old male presenting with complaints of shortness of breath.  Patient recently admitted for gout in his knee and urinary tract infection and has been home for several days.  He was sent home on oxygen due to low oxygen saturations.  His breathing worsened this afternoon and presents for evaluation of this.  EMS found him with oxygen  saturations of 80% and was transported here on nonrebreather.  Upon arrival, oxygen saturations were 85% on nonrebreather.  He is borderline febrile with a temp of 100.2.  Blood cultures were obtained and antibiotics were given for presumed pulmonary source/HCAP.  He was also found to have an elevated BNP and findings concerning for CHF on his x-ray.  Patient given Lasix for this.  Patient seems more comfortable as his ER course goes on, but I still believe will require admission.  I have spoken with Dr. Daryll Drown from the hospitalist service who agrees to admit.  CRITICAL CARE Performed by: Veryl Speak Total critical care time: 45 minutes Critical care time was exclusive of separately billable procedures and treating other patients. Critical care was necessary to treat or prevent imminent or life-threatening deterioration. Critical care was time spent personally by me on the following activities: development of treatment plan with patient and/or surrogate as well as nursing, discussions with consultants, evaluation of patient's response to treatment, examination of patient, obtaining history from patient or surrogate, ordering and performing treatments and interventions, ordering and review of laboratory studies, ordering and review of radiographic studies, pulse oximetry and re-evaluation of patient's condition.   Final Clinical Impression(s) / ED Diagnoses Final diagnoses:  None    Rx / DC Orders ED Discharge Orders    None       Veryl Speak, MD 09/01/19 2207

## 2019-09-02 ENCOUNTER — Inpatient Hospital Stay (HOSPITAL_COMMUNITY): Payer: Medicare Other

## 2019-09-02 DIAGNOSIS — I4891 Unspecified atrial fibrillation: Secondary | ICD-10-CM

## 2019-09-02 DIAGNOSIS — I350 Nonrheumatic aortic (valve) stenosis: Secondary | ICD-10-CM | POA: Diagnosis not present

## 2019-09-02 DIAGNOSIS — R0902 Hypoxemia: Secondary | ICD-10-CM | POA: Insufficient documentation

## 2019-09-02 DIAGNOSIS — I361 Nonrheumatic tricuspid (valve) insufficiency: Secondary | ICD-10-CM | POA: Diagnosis not present

## 2019-09-02 DIAGNOSIS — Z950 Presence of cardiac pacemaker: Secondary | ICD-10-CM

## 2019-09-02 DIAGNOSIS — I340 Nonrheumatic mitral (valve) insufficiency: Secondary | ICD-10-CM

## 2019-09-02 LAB — BASIC METABOLIC PANEL
Anion gap: 15 (ref 5–15)
BUN: 29 mg/dL — ABNORMAL HIGH (ref 8–23)
CO2: 26 mmol/L (ref 22–32)
Calcium: 8.9 mg/dL (ref 8.9–10.3)
Chloride: 99 mmol/L (ref 98–111)
Creatinine, Ser: 1.51 mg/dL — ABNORMAL HIGH (ref 0.61–1.24)
GFR calc Af Amer: 46 mL/min — ABNORMAL LOW (ref >60)
GFR calc non Af Amer: 40 mL/min — ABNORMAL LOW (ref >60)
Glucose, Bld: 123 mg/dL — ABNORMAL HIGH (ref 70–99)
Potassium: 3.7 mmol/L (ref 3.5–5.1)
Sodium: 140 mmol/L (ref 135–145)

## 2019-09-02 LAB — CBC
HCT: 38.3 % — ABNORMAL LOW (ref 39.0–52.0)
Hemoglobin: 12.6 g/dL — ABNORMAL LOW (ref 13.0–17.0)
MCH: 31.1 pg (ref 26.0–34.0)
MCHC: 32.9 g/dL (ref 30.0–36.0)
MCV: 94.6 fL (ref 80.0–100.0)
Platelets: 211 10*3/uL (ref 150–400)
RBC: 4.05 MIL/uL — ABNORMAL LOW (ref 4.22–5.81)
RDW: 14.1 % (ref 11.5–15.5)
WBC: 15.7 10*3/uL — ABNORMAL HIGH (ref 4.0–10.5)
nRBC: 0 % (ref 0.0–0.2)

## 2019-09-02 MED ORDER — ONDANSETRON HCL 4 MG/2ML IJ SOLN: 4.0000 mg | Freq: Four times a day (QID) | INTRAMUSCULAR | Status: DC | PRN

## 2019-09-02 MED ORDER — ALBUTEROL SULFATE (2.5 MG/3ML) 0.083% IN NEBU: 2.5000 mg | INHALATION_SOLUTION | Freq: Two times a day (BID) | RESPIRATORY_TRACT | Status: DC

## 2019-09-02 MED ORDER — ONDANSETRON HCL 4 MG PO TABS: 4.0000 mg | ORAL_TABLET | Freq: Four times a day (QID) | ORAL | Status: DC | PRN

## 2019-09-02 MED ORDER — AMLODIPINE BESYLATE 10 MG PO TABS
10.0000 mg | ORAL_TABLET | Freq: Every day | ORAL | Status: DC
  Administered 2019-09-02 – 2019-09-08 (×7): 10 mg via ORAL
  Filled 2019-09-02 (×7): qty 1

## 2019-09-02 MED ORDER — SODIUM CHLORIDE 0.9% FLUSH
3.0000 mL | Freq: Two times a day (BID) | INTRAVENOUS | Status: DC
  Administered 2019-09-02 – 2019-09-08 (×12): 3 mL via INTRAVENOUS

## 2019-09-02 MED ORDER — SIMVASTATIN 20 MG PO TABS
20.0000 mg | ORAL_TABLET | Freq: Every day | ORAL | Status: DC
  Administered 2019-09-02 – 2019-09-07 (×6): 20 mg via ORAL
  Filled 2019-09-02 (×6): qty 1

## 2019-09-02 MED ORDER — ALBUTEROL SULFATE (2.5 MG/3ML) 0.083% IN NEBU
2.5000 mg | INHALATION_SOLUTION | Freq: Two times a day (BID) | RESPIRATORY_TRACT | Status: DC
  Administered 2019-09-02 (×2): 2.5 mg via RESPIRATORY_TRACT
  Filled 2019-09-02 (×3): qty 3

## 2019-09-02 MED ORDER — ISOSORBIDE MONONITRATE ER 60 MG PO TB24
90.0000 mg | ORAL_TABLET | Freq: Every day | ORAL | Status: DC
  Administered 2019-09-02 – 2019-09-08 (×7): 90 mg via ORAL
  Filled 2019-09-02 (×7): qty 1

## 2019-09-02 MED ORDER — ACETAMINOPHEN 325 MG PO TABS
650.0000 mg | ORAL_TABLET | Freq: Four times a day (QID) | ORAL | Status: DC | PRN
  Administered 2019-09-03: 13:00:00 650 mg via ORAL
  Filled 2019-09-02: qty 2

## 2019-09-02 MED ORDER — DOXYCYCLINE HYCLATE 100 MG PO TABS
100.0000 mg | ORAL_TABLET | Freq: Two times a day (BID) | ORAL | Status: DC
  Administered 2019-09-02 – 2019-09-06 (×9): 100 mg via ORAL
  Filled 2019-09-02 (×9): qty 1

## 2019-09-02 MED ORDER — ALBUTEROL SULFATE (2.5 MG/3ML) 0.083% IN NEBU
2.5000 mg | INHALATION_SOLUTION | Freq: Four times a day (QID) | RESPIRATORY_TRACT | Status: DC
  Administered 2019-09-02: 2.5 mg via RESPIRATORY_TRACT
  Filled 2019-09-02: qty 3

## 2019-09-02 MED ORDER — NITROGLYCERIN 0.4 MG SL SUBL: 0.4000 mg | SUBLINGUAL_TABLET | SUBLINGUAL | Status: DC | PRN

## 2019-09-02 MED ORDER — APIXABAN 2.5 MG PO TABS
2.5000 mg | ORAL_TABLET | Freq: Two times a day (BID) | ORAL | Status: DC
  Administered 2019-09-02 – 2019-09-08 (×13): 2.5 mg via ORAL
  Filled 2019-09-02 (×14): qty 1

## 2019-09-02 MED ORDER — ALBUTEROL SULFATE (2.5 MG/3ML) 0.083% IN NEBU: 2.5000 mg | INHALATION_SOLUTION | RESPIRATORY_TRACT | Status: DC | PRN

## 2019-09-02 MED ORDER — FUROSEMIDE 10 MG/ML IJ SOLN
40.0000 mg | Freq: Two times a day (BID) | INTRAMUSCULAR | Status: DC
  Administered 2019-09-02 – 2019-09-03 (×4): 40 mg via INTRAVENOUS
  Filled 2019-09-02 (×4): qty 4

## 2019-09-02 NOTE — Progress Notes (Signed)
Patient's daughter, Caren Griffins, is currently at patient bedside, requesting to speak with MD regarding updates. Cruzita Lederer, MD paged.

## 2019-09-02 NOTE — Progress Notes (Signed)
  Echocardiogram 2D Echocardiogram has been performed.  Jesus Beck 09/02/2019, 5:53 PM

## 2019-09-02 NOTE — Plan of Care (Signed)

## 2019-09-02 NOTE — Evaluation (Signed)
Physical Therapy Evaluation Patient Details Name: Jesus Beck MRN: [id number] DOB: 1925-07-22 Today's Date: 09/02/2019   History of Present Illness  86 year old male with history of chronic kidney disease stage IIIb, HLD, HTN, carotid stenosis status post bilateral endarterectomy, CAD with history of CABG, permanent A. fib with PCM in place and anticoagulated on Eliquis came into the hospital with shortness of breath.  He developed difficulties breathing the day of admission while working with occupational therapy.  He was found to be hypoxic with oxygen level at 78%, and he was placed on nonrebreather by EMS.  He was recently admitted to the hospital 4/15-4/24 gout attack in his left knee as well as pneumonia, was sent home on oxygen, prednisone and antibiotics.  Clinical Impression  Orders received for PT evaluation. Patient demonstrates deficits in functional mobility as indicated below. Will benefit from continued skilled PT to address deficits and maximize function. Will see as indicated and progress as tolerated.  Will likely need post acute rehabilitation if does not make improvements.     Follow Up Recommendations SNF;Supervision/Assistance - 24 hour(vs home health PT penmding progress)    Equipment Recommendations  Wheelchair cushion (measurements PT);Wheelchair (measurements PT);Hospital bed    Recommendations for Other Services       Precautions / Restrictions Precautions Precautions: Fall;Other (comment) Precaution Comments: watch sats Restrictions Weight Bearing Restrictions: No      Mobility  Bed Mobility Overal bed mobility: Needs Assistance Bed Mobility: Supine to Sit     Supine to sit: Min assist;HOB elevated Sit to supine: Min assist   General bed mobility comments: minA to progress trunk to upright posture  Transfers Overall transfer level: Needs assistance Equipment used: Rolling walker (2 wheeled) Transfers: Sit to/from Stand Sit to Stand: Max assist          General transfer comment: VCs for hand placement and positioning, max assist to power up to standing 4 attempts before reaching upright, difficulty with tolerating upright position  Ambulation/Gait             General Gait Details: patient could not tolerate ambulatin attempt  Stairs            Wheelchair Mobility    Modified Rankin (Stroke Patients Only)       Balance Overall balance assessment: Needs assistance Sitting-balance support: Feet supported;No upper extremity supported Sitting balance-Leahy Scale: Fair Sitting balance - Comments: static sitting balance with minguard assist, intermittently looses balance posteriorly   Standing balance support: Bilateral upper extremity supported;During functional activity Standing balance-Leahy Scale: Poor Standing balance comment: reliant on UE support                              Pertinent Vitals/Pain Pain Assessment: Faces Faces Pain Scale: Hurts even more Pain Location: back and bilateral ankle pain Pain Descriptors / Indicators: Guarding Pain Intervention(s): Monitored during session    Home Living Family/patient expects to be discharged to:: Private residence Living Arrangements: Children(daughter) Available Help at Discharge: Family;Available 24 hours/day Type of Home: House Home Access: Stairs to enter   [employer] of Steps: 3 Home Layout: Two level Home Equipment: Cane - single point;Bedside commode;Walker - 4 wheels      Prior Function Level of Independence: Needs assistance   Gait / Transfers Assistance Needed: min assist min guard with ambulation and aides  ADL's / Homemaking Assistance Needed: assist from aides  Comments: Does not drive. Reports 2 falls. Independent with ADL's/IADL's  including medication management, able to prepare a simple meal if he needs to     Hand Dominance   Dominant Hand: Right    Extremity/Trunk Assessment   Upper Extremity  Assessment Upper Extremity Assessment: Generalized weakness    Lower Extremity Assessment Lower Extremity Assessment: Generalized weakness    Cervical / Trunk Assessment Cervical / Trunk Assessment: Kyphotic  Communication   Communication: HOH  Cognition Arousal/Alertness: Awake/alert Behavior During Therapy: WFL for tasks assessed/performed Overall Cognitive Status: No family/caregiver present to determine baseline cognitive functioning Area of Impairment: Memory;Awareness;Problem solving;Safety/judgement;Orientation                 Orientation Level: Disoriented to;Situation;Time Current Attention Level: Sustained Memory: Decreased short-term memory   Safety/Judgement: Decreased awareness of safety Awareness: Emergent Problem Solving: Difficulty sequencing;Requires verbal cues General Comments: pt required frequent and consistent cues during mobility;pt with slow processing      General Comments General comments (skin integrity, edema, etc.): SpO2 dropped to 87% on 3 liters with transfer activity attempts, imporved with rest    Exercises     Assessment/Plan    PT Assessment Patient needs continued PT services  PT Problem List Decreased strength;Decreased activity tolerance;Decreased balance;Decreased mobility;Pain       PT Treatment Interventions DME instruction;Gait training;Stair training;Functional mobility training;Therapeutic activities;Therapeutic exercise;Balance training;Patient/family education    PT Goals (Current goals can be found in the Care Plan section)  Acute Rehab PT Goals Patient Stated Goal: home PT Goal Formulation: With patient/family Time For Goal Achievement: 09/01/19 Potential to Achieve Goals: Good    Frequency Min 3X/week   Barriers to discharge        Co-evaluation               AM-PAC PT "6 Clicks" Mobility  Outcome Measure Help needed turning from your back to your side while in a flat bed without using bedrails?:  None Help needed moving from lying on your back to sitting on the side of a flat bed without using bedrails?: A Little Help needed moving to and from a bed to a chair (including a wheelchair)?: A Little Help needed standing up from a chair using your arms (e.g., wheelchair or bedside chair)?: A Lot Help needed to walk in hospital room?: A Little Help needed climbing 3-5 steps with a railing? : A Lot 6 Click Score: 17    End of Session Equipment Utilized During Treatment: Gait belt;Oxygen Activity Tolerance: Patient tolerated treatment well Patient left: in chair;with call bell/phone within reach;with chair alarm set Nurse Communication: Mobility status PT Visit Diagnosis: Unsteadiness on feet (R26.81);Muscle weakness (generalized) (M62.81);Difficulty in walking, not elsewhere classified (R26.2);Pain Pain - Right/Left: (bilateral) Pain - part of body: Knee    Time: 1250-1311 PT Time Calculation (min) (ACUTE ONLY): 21 min   Charges:   PT Evaluation $PT Eval Moderate Complexity: 1 Mod          Alben Deeds, PT DPT  Board Certified Neurologic Specialist Acute Rehabilitation Services    Duncan Dull 09/02/2019, 1:12 PM

## 2019-09-02 NOTE — ED Notes (Signed)
Pt. had an incontinent episode, urine leaked from [place] cath on to bed. Peri care provided, pt. repositioned.

## 2019-09-02 NOTE — Progress Notes (Signed)
PROGRESS NOTE  Jesus Beck Mossa [id number] DOB: 1925-10-09 DOA: 09/01/2019 PCP: Seward Carol, MD   LOS: 1 day   Brief Narrative / Interim history: 86 year old male with history of chronic kidney disease stage IIIb, HLD, HTN, carotid stenosis status post bilateral endarterectomy, CAD with history of CABG, permanent A. fib with PCM in place and anticoagulated on Eliquis came into the hospital with shortness of breath.  He developed difficulties breathing the day of admission while working with occupational therapy.  He was found to be hypoxic with oxygen level at 78%, and he was placed on nonrebreather by EMS.  He was recently admitted to the hospital 4/15-4/24 gout attack in his left knee as well as pneumonia, was sent home on oxygen, prednisone and antibiotics.  Subjective / 24h Interval events: He states that he is feeling a little bit better this morning but still short of breath.  He denies any chest pain.  He has no abdominal pain, no nausea or vomiting.  He also reports that he has noticed a little bit worsening swelling in his legs.  Assessment & Plan: Principal Problem Acute on chronic hypoxic respiratory failure likely in the setting of pulmonary edema, POA-patient was started on Lasix, continue, continue to monitor strict I's and O's, renal function is within his baseline this morning. -Continue supportive treatment with oxygen, currently on 4 L nasal cannula, continue nebulizers as needed -2D echo has been ordered  Active Problems Fever, leukocytosis-temperature was 100.2, he was tachypneic in the ED.  His white count on admission was 16.5 and now 15.  This may be multifactorial recovering from pneumonia and was also on steroids at home.  He denies any cough/chest congestion.  Not clear whether he finished a course of antibiotics as he manages his own medications at home.  Would favor to do a short course of doxycycline  Acute on chronic CHF, unknown type-2D echo pending  Chronic  kidney disease stage IIIb-Baseline creatinine ranging from 1.5-2.0 back in 2014.  His creatinine is currently within his acceptable baseline at 1.5.  Continue Lasix, avoid nephrotoxic agents  Coronary artery disease with history of CABG-he is stable, troponin slightly elevated but overall flat, not in a pattern consistent with ACS.  No chest pain.  Continue statin, Imdur  Permanent A. fib-pacemaker in place-continue anticoagulation with Eliquis  Essential hypertension-continue Norvasc, furosemide, Imdur  Hyperlipidemia-continue statin  Recently gouty attack left knee-during his prior hospital stay about couple weeks ago, orthopedic surgery consulted at that time and had knee aspiration which was consistent with gout.  He finished a course of steroids, states that knee is much better today.    Scheduled Meds: . albuterol  2.5 mg Nebulization BID  . amLODipine  10 mg Oral Daily  . apixaban  2.5 mg Oral BID  . furosemide  40 mg Intravenous BID  . isosorbide mononitrate  90 mg Oral Daily  . simvastatin  20 mg Oral q1800  . sodium chloride flush  3 mL Intravenous Q12H   Continuous Infusions: PRN Meds:.albuterol, nitroGLYCERIN, ondansetron **OR** ondansetron (ZOFRAN) IV  DVT prophylaxis: Eliquis Code Status: Full code Family Communication: will update daughter when she visits  Status is: Inpatient  Remains inpatient appropriate because:Persistent hypoxia, fluid overload   Dispo: The patient is from: Home              Anticipated d/c is to: Home              Anticipated d/c date is: 3 days  Patient currently is not medically stable to d/c.  Consultants:  None   Procedures:  2D echo: pending   Microbiology  None   Antimicrobials: Doxycycline 5/1 >>     Objective: Vitals:   09/02/19 0118 09/02/19 0158 09/02/19 0353 09/02/19 0400  BP: (!) 167/68 (!) 158/66  (!) 117/96  Pulse: 61 60 68 73  Resp: (!) 34 18 16 (!) 22  Temp:  97.8 F (36.6 C)  97.7 F (36.5  C)  TempSrc:  Oral  Oral  SpO2: 98% 94% 96% 98%  Weight:  67.8 kg    Height:  5\' 8"  (1.727 m)      Intake/Output Summary (Last 24 hours) at 09/02/2019 0722 Last data filed at 09/02/2019 0500 Gross per 24 hour  Intake 440.12 ml  Output 2100 ml  Net -1659.88 ml   Filed Weights   09/01/19 1912 09/02/19 0158  Weight: 69.4 kg 67.8 kg    Examination:  Constitutional: NAD Eyes: no scleral icterus ENMT: Mucous membranes are moist.  Neck: normal, supple Respiratory: Diminished at the bases but overall clear without wheezing.  He is tachypneic Cardiovascular: Regular rate and rhythm, no murmurs appreciated.  1+ pitting pretibial LE edema. Good peripheral pulses Abdomen: non distended, no tenderness. Bowel sounds positive.  Musculoskeletal: no clubbing / cyanosis.  Skin: no rashes Neurologic: CN 2-12 grossly intact. Strength 5/5 in all 4.    Data Reviewed: I have independently reviewed following labs and imaging studies   CBC: Recent Labs  Lab 09/01/19 1841 09/02/19 0505  WBC 16.5* 15.7*  NEUTROABS 14.6*  --   HGB 14.2 12.6*  HCT 44.1 38.3*  MCV 95.2 94.6  PLT 263 [id number]   Basic Metabolic Panel: Recent Labs  Lab 09/01/19 1841 09/02/19 0505  NA 140 140  K 4.3 3.7  CL 101 99  CO2 24 26  GLUCOSE 126* 123*  BUN 32* 29*  CREATININE 1.39* 1.51*  CALCIUM 9.1 8.9   Liver Function Tests: Recent Labs  Lab 09/01/19 1841  AST 21  ALT 26  ALKPHOS 84  BILITOT 2.1*  PROT 6.7  ALBUMIN 3.1*   Coagulation Profile: No results for input(s): INR, PROTIME in the last 168 hours. HbA1C: No results for input(s): HGBA1C in the last 72 hours. CBG: No results for input(s): GLUCAP in the last 168 hours.  Recent Results (from the past 240 hour(s))  Respiratory Panel by RT PCR (Flu A&B, Covid) - Nasopharyngeal Swab     Status: None   Collection Time: 09/01/19  7:10 PM   Specimen: Nasopharyngeal Swab  Result Value Ref Range Status   SARS Coronavirus 2 by RT PCR NEGATIVE NEGATIVE  Final    Comment: (NOTE) SARS-CoV-2 target nucleic acids are NOT DETECTED. The SARS-CoV-2 RNA is generally detectable in upper respiratoy specimens during the acute phase of infection. The lowest concentration of SARS-CoV-2 viral copies this assay can detect is 131 copies/mL. A negative result does not preclude SARS-Cov-2 infection and should not be used as the sole basis for treatment or other patient management decisions. A negative result may occur with  improper specimen collection/handling, submission of specimen other than nasopharyngeal swab, presence of viral mutation(s) within the areas targeted by this assay, and inadequate number of viral copies (<131 copies/mL). A negative result must be combined with clinical observations, patient history, and epidemiological information. The expected result is Negative. Fact Sheet for Patients:  PinkCheek.be Fact Sheet for Healthcare Providers:  GravelBags.it This test is not yet ap proved or cleared  by the [place] and  has been authorized for detection and/or diagnosis of SARS-CoV-2 by FDA under an Emergency Use Authorization (EUA). This EUA will remain  in effect (meaning this test can be used) for the duration of the COVID-19 declaration under Section 564(b)(1) of the Act, 21 U.S.C. section 360bbb-3(b)(1), unless the authorization is terminated or revoked sooner.    Influenza A by PCR NEGATIVE NEGATIVE Final   Influenza B by PCR NEGATIVE NEGATIVE Final    Comment: (NOTE) The Xpert Xpress SARS-CoV-2/FLU/RSV assay is intended as an aid in  the diagnosis of influenza from Nasopharyngeal swab specimens and  should not be used as a sole basis for treatment. Nasal washings and  aspirates are unacceptable for Xpert Xpress SARS-CoV-2/FLU/RSV  testing. Fact Sheet for Patients: PinkCheek.be Fact Sheet for Healthcare  Providers: GravelBags.it This test is not yet approved or cleared by the [place] FDA and  has been authorized for detection and/or diagnosis of SARS-CoV-2 by  FDA under an Emergency Use Authorization (EUA). This EUA will remain  in effect (meaning this test can be used) for the duration of the  Covid-19 declaration under Section 564(b)(1) of the Act, 21  U.S.C. section 360bbb-3(b)(1), unless the authorization is  terminated or revoked. Performed at [facility], [place] [address], [place], [place] [address]      Radiology Studies: DG Chest Port 1 View  Result Date: 09/01/2019 CLINICAL DATA:  Shortness of breath EXAM: PORTABLE CHEST 1 VIEW COMPARISON:  08/20/2019 FINDINGS: Cardiac shadow is stable. Postsurgical changes are noted. Pacemaker is again noted and stable. Aortic calcifications are again seen. Elevation of the right hemidiaphragm is noted. Small left pleural effusion and left basilar atelectasis is noted. Mild vascular congestion remains. No bony abnormality is seen. IMPRESSION: Mild vascular congestion with small left pleural effusion left basilar atelectasis. Electronically Signed   By: Inez Catalina M.D.   On: 09/01/2019 18:57    Marzetta Board, MD, PhD [facility]  Between 7 am - 7 pm I am available, please contact me via Amion or Securechat  Between 7 pm - 7 am I am not available, please contact night coverage MD/APP via Amion

## 2019-09-03 ENCOUNTER — Inpatient Hospital Stay (HOSPITAL_COMMUNITY): Payer: Medicare Other

## 2019-09-03 LAB — COMPREHENSIVE METABOLIC PANEL
ALT: 18 U/L (ref 0–44)
AST: 21 U/L (ref 15–41)
Albumin: 2.4 g/dL — ABNORMAL LOW (ref 3.5–5.0)
Alkaline Phosphatase: 67 U/L (ref 38–126)
Anion gap: 15 (ref 5–15)
BUN: 33 mg/dL — ABNORMAL HIGH (ref 8–23)
CO2: 27 mmol/L (ref 22–32)
Calcium: 8.7 mg/dL — ABNORMAL LOW (ref 8.9–10.3)
Chloride: 95 mmol/L — ABNORMAL LOW (ref 98–111)
Creatinine, Ser: 1.67 mg/dL — ABNORMAL HIGH (ref 0.61–1.24)
GFR calc Af Amer: 41 mL/min — ABNORMAL LOW (ref >60)
GFR calc non Af Amer: 35 mL/min — ABNORMAL LOW (ref >60)
Glucose, Bld: 119 mg/dL — ABNORMAL HIGH (ref 70–99)
Potassium: 2.8 mmol/L — ABNORMAL LOW (ref 3.5–5.1)
Sodium: 137 mmol/L (ref 135–145)
Total Bilirubin: 3.4 mg/dL — ABNORMAL HIGH (ref 0.3–1.2)
Total Protein: 5.8 g/dL — ABNORMAL LOW (ref 6.5–8.1)

## 2019-09-03 LAB — CBC
HCT: 35.4 % — ABNORMAL LOW (ref 39.0–52.0)
Hemoglobin: 11.6 g/dL — ABNORMAL LOW (ref 13.0–17.0)
MCH: 30.6 pg (ref 26.0–34.0)
MCHC: 32.8 g/dL (ref 30.0–36.0)
MCV: 93.4 fL (ref 80.0–100.0)
Platelets: 189 10*3/uL (ref 150–400)
RBC: 3.79 MIL/uL — ABNORMAL LOW (ref 4.22–5.81)
RDW: 13.9 % (ref 11.5–15.5)
WBC: 18 10*3/uL — ABNORMAL HIGH (ref 4.0–10.5)
nRBC: 0 % (ref 0.0–0.2)

## 2019-09-03 LAB — ECHOCARDIOGRAM COMPLETE
Height: 68 in
Weight: 2391.55 oz

## 2019-09-03 LAB — POTASSIUM: Potassium: 4.1 mmol/L (ref 3.5–5.1)

## 2019-09-03 LAB — MAGNESIUM: Magnesium: 1.3 mg/dL — ABNORMAL LOW (ref 1.7–2.4)

## 2019-09-03 MED ORDER — MAGNESIUM SULFATE 2 GM/50ML IV SOLN
2.0000 g | Freq: Once | INTRAVENOUS | Status: AC
  Administered 2019-09-03: 2 g via INTRAVENOUS
  Filled 2019-09-03: qty 50

## 2019-09-03 MED ORDER — TRAMADOL HCL 50 MG PO TABS
25.0000 mg | ORAL_TABLET | Freq: Four times a day (QID) | ORAL | Status: DC | PRN
  Administered 2019-09-04 – 2019-09-06 (×3): 50 mg via ORAL
  Filled 2019-09-03 (×3): qty 1

## 2019-09-03 MED ORDER — POTASSIUM CHLORIDE 20 MEQ/15ML (10%) PO SOLN
40.0000 meq | ORAL | Status: AC
  Administered 2019-09-03 (×2): 40 meq via ORAL
  Filled 2019-09-03 (×2): qty 30

## 2019-09-03 MED ORDER — PREDNISONE 20 MG PO TABS
20.0000 mg | ORAL_TABLET | Freq: Every day | ORAL | Status: DC
  Administered 2019-09-03 – 2019-09-08 (×6): 20 mg via ORAL
  Filled 2019-09-03 (×6): qty 1

## 2019-09-03 NOTE — Progress Notes (Signed)
PROGRESS NOTE  Jesus Beck [id number] DOB: April 05, 1926 DOA: 09/01/2019 PCP: Seward Carol, MD   LOS: 2 days   Brief Narrative / Interim history: 86 year old male with history of chronic kidney disease stage IIIb, HLD, HTN, carotid stenosis status post bilateral endarterectomy, CAD with history of CABG, permanent A. fib with PCM in place and anticoagulated on Eliquis came into the hospital with shortness of breath.  He developed difficulties breathing the day of admission while working with occupational therapy.  He was found to be hypoxic with oxygen level at 78%, and he was placed on nonrebreather by EMS.  He was recently admitted to the hospital 4/15-4/24 gout attack in his left knee as well as pneumonia, was sent home on oxygen, prednisone and antibiotics.  Subjective / 24h Interval events: He states that he is feeling a little bit better this morning but still short of breath.  He denies any chest pain.  He has no abdominal pain, no nausea or vomiting.  He also reports that he has noticed a little bit worsening swelling in his legs.  Assessment & Plan: Principal Problem Acute on chronic hypoxic respiratory failure likely in the setting of pulmonary edema, POA-patient was started on Lasix, responded really well, he is net -3 L and down 15 pounds.  Creatinine slightly up, received IV Lasix this morning but hold tonight's IV Lasix dose -2D echo done yesterday, read pending  Active Problems Fever, leukocytosis-temperature was 100.2, he was tachypneic in the ED. his white count is actually increasing today at 18,000.  Empirically placed on doxycycline to treat lung source, continue.  Repeat chest x-ray today, obtain right upper quadrant ultrasound due to elevated bilirubin and repeat urinalysis  Hypokalemia-due to diuresis, aggressively replete potassium this morning and recheck this afternoon  Elevated bilirubin-bilirubin slightly up today compared to yesterday, he has no GI symptoms.   AST/ALT/alk phos are normal.  Obtain limited right upper quadrant ultrasound  Acute on chronic CHF, unknown type-2D echo pending  Chronic kidney disease stage IIIb-Baseline creatinine ranging from 1.5-2.0 back in 2014.  His creatinine is currently within his acceptable baseline, increased a little bit to 1.6 from 1.5 yesterday.  Hold Lasix due to excellent diuresis and hypokalemia  Coronary artery disease with history of CABG-he is stable, troponin slightly elevated but overall flat, not in a pattern consistent with ACS.  No chest pain.  Continue statin, Imdur  Permanent A. fib-pacemaker in place-continue anticoagulation with Eliquis  Essential hypertension-continue Norvasc, furosemide, Imdur  Hyperlipidemia-continue statin  Recently gouty attack left knee-during his prior hospital stay about couple weeks ago, orthopedic surgery consulted at that time and had knee aspiration which was consistent with gout.  He finished a course of steroids, states that knee is much better today.    Scheduled Meds: . amLODipine  10 mg Oral Daily  . apixaban  2.5 mg Oral BID  . doxycycline  100 mg Oral Q12H  . isosorbide mononitrate  90 mg Oral Daily  . potassium chloride  40 mEq Oral Q3H  . simvastatin  20 mg Oral q1800  . sodium chloride flush  3 mL Intravenous Q12H   Continuous Infusions: . magnesium sulfate bolus IVPB     PRN Meds:.acetaminophen, albuterol, nitroGLYCERIN, ondansetron **OR** ondansetron (ZOFRAN) IV  DVT prophylaxis: Eliquis Code Status: Full code Family Communication: Updated daughter at bedside on 5/1  Status is: Inpatient  Remains inpatient appropriate because:Worsening leukocytosis, increased bilirubin, severe hypokalemia  Dispo: The patient is from: Home  Anticipated d/c is to: Home              Anticipated d/c date is: 3 days              Patient currently is not medically stable to d/c.  Consultants:  None   Procedures:  2D echo: pending    Microbiology  None   Antimicrobials: Doxycycline 5/1 >>     Objective: Vitals:   09/03/19 0413 09/03/19 0417 09/03/19 0833 09/03/19 0836  BP: (!) 163/73  (!) 144/68   Pulse: 94  62   Resp: _0 Temp: 98.4 F (36.9 C)  98.2 F (36.8 C)   TempSrc: Oral  Oral   SpO2: 91%  91%   Weight:  62.4 kg    Height:        Intake/Output Summary (Last 24 hours) at 09/03/2019 0857 Last data filed at 09/03/2019 0415 Gross per 24 hour  Intake 580 ml  Output 2105 ml  Net -1525 ml   Filed Weights   09/01/19 1912 09/02/19 0158 09/03/19 0417  Weight: 69.4 kg 67.8 kg 62.4 kg    Examination:  Constitutional: No distress Eyes: No icterus seen ENMT: Moist mucous membranes Neck: normal, supple Respiratory: Diminished at the bases but clear, no wheezing, tachypnea improved Cardiovascular: Regular rate and rhythm, no murmurs, trace pretibial edema Abdomen: Soft, nontender, nondistended, positive bowel sounds Musculoskeletal: no clubbing / cyanosis.  Skin: No rashes appreciated Neurologic: No focal deficits   Data Reviewed: I have independently reviewed following labs and imaging studies   CBC: Recent Labs  Lab 09/01/19 1841 09/02/19 0505 09/03/19 0528  WBC 16.5* 15.7* 18.0*  NEUTROABS 14.6*  --   --   HGB 14.2 12.6* 11.6*  HCT 44.1 38.3* 35.4*  MCV 95.2 94.6 93.4  PLT 263 211 188   Basic Metabolic Panel: Recent Labs  Lab 09/01/19 1841 09/02/19 0505 09/03/19 0528  NA 140 140 137  K 4.3 3.7 2.8*  CL 101 99 95*  CO2 _1 GLUCOSE 126* 123* 119*  BUN 32* 29* 33*  CREATININE 1.39* 1.51* 1.67*  CALCIUM 9.1 8.9 8.7*  MG  --   --  1.3*   Liver Function Tests: Recent Labs  Lab 09/01/19 1841 09/03/19 0528  AST 21 21  ALT 26 18  ALKPHOS 84 67  BILITOT 2.1* 3.4*  PROT 6.7 5.8*  ALBUMIN 3.1* 2.4*   Coagulation Profile: No results for input(s): INR, PROTIME in the last 168 hours. HbA1C: No results for input(s): HGBA1C in the last 72 hours. CBG: No  results for input(s): GLUCAP in the last 168 hours.  Recent Results (from the past 240 hour(s))  Blood culture (routine x 2)     Status: None (Preliminary result)   Collection Time: 09/01/19  6:41 PM   Specimen: BLOOD  Result Value Ref Range Status   Specimen Description BLOOD RIGHT ANTECUBITAL  Final   Special Requests   Final    BOTTLES DRAWN AEROBIC AND ANAEROBIC Blood Culture adequate volume   Culture   Final    NO GROWTH 2 DAYS Performed at [facility], [address] [place], [place] [address]    Report Status PENDING  Incomplete  Blood culture (routine x 2)     Status: None (Preliminary result)   Collection Time: 09/01/19  6:41 PM   Specimen: BLOOD LEFT HAND  Result Value Ref Range Status   Specimen Description BLOOD LEFT HAND  Final  Special Requests   Final    BOTTLES DRAWN AEROBIC AND ANAEROBIC Blood Culture results may not be optimal due to an inadequate volume of blood received in culture bottles   Culture   Final    NO GROWTH 2 DAYS Performed at [facility], [address] [place], [place] [address]    Report Status PENDING  Incomplete  Respiratory Panel by RT PCR (Flu A&B, Covid) - Nasopharyngeal Swab     Status: None   Collection Time: 09/01/19  7:10 PM   Specimen: Nasopharyngeal Swab  Result Value Ref Range Status   SARS Coronavirus 2 by RT PCR NEGATIVE NEGATIVE Final    Comment: (NOTE) SARS-CoV-2 target nucleic acids are NOT DETECTED. The SARS-CoV-2 RNA is generally detectable in upper respiratoy specimens during the acute phase of infection. The lowest concentration of SARS-CoV-2 viral copies this assay can detect is 131 copies/mL. A negative result does not preclude SARS-Cov-2 infection and should not be used as the sole basis for treatment or other patient management decisions. A negative result may occur with  improper specimen collection/handling, submission of specimen other than nasopharyngeal swab, presence of viral mutation(s) within  the areas targeted by this assay, and inadequate number of viral copies (<131 copies/mL). A negative result must be combined with clinical observations, patient history, and epidemiological information. The expected result is Negative. Fact Sheet for Patients:  https://www.fda.gov/media/142436/download Fact Sheet for Healthcare Providers:  https://www.fda.gov/media/142435/download This test is not yet ap proved or cleared by the [place] FDA and  has been authorized for detection and/or diagnosis of SARS-CoV-2 by FDA under an Emergency Use Authorization (EUA). This EUA will remain  in effect (meaning this test can be used) for the duration of the COVID-19 declaration under Section 564(b)(1) of the Act, 21 U.S.C. section 360bbb-3(b)(1), unless the authorization is terminated or revoked sooner.    Influenza A by PCR NEGATIVE NEGATIVE Final   Influenza B by PCR NEGATIVE NEGATIVE Final    Comment: (NOTE) The Xpert Xpress SARS-CoV-2/FLU/RSV assay is intended as an aid in  the diagnosis of influenza from Nasopharyngeal swab specimens and  should not be used as a sole basis for treatment. Nasal washings and  aspirates are unacceptable for Xpert Xpress SARS-CoV-2/FLU/RSV  testing. Fact Sheet for Patients: https://www.fda.gov/media/142436/download Fact Sheet for Healthcare Providers: https://www.fda.gov/media/142435/download This test is not yet approved or cleared by the [place] FDA and  has been authorized for detection and/or diagnosis of SARS-CoV-2 by  FDA under an Emergency Use Authorization (EUA). This EUA will remain  in effect (meaning this test can be used) for the duration of the  Covid-19 declaration under Section 564(b)(1) of the Act, 21  U.S.C. section 360bbb-3(b)(1), unless the authorization is  terminated or revoked. Performed at [facility], [address] [place], [place] [address]      Radiology Studies: No results found.  Costin Gherghe, MD,  PhD [facility]  Between 7 am - 7 pm I am available, please contact me via Amion or Securechat  Between 7 pm - 7 am I am not available, please contact night coverage MD/APP via Amion    

## 2019-09-03 NOTE — Plan of Care (Signed)
  Problem: Pain Managment: Goal: General experience of comfort will improve Outcome: Progressing   Problem: Safety: Goal: Ability to remain free from injury will improve Outcome: Progressing   

## 2019-09-04 LAB — COMPREHENSIVE METABOLIC PANEL
ALT: 18 U/L (ref 0–44)
AST: 18 U/L (ref 15–41)
Albumin: 2.3 g/dL — ABNORMAL LOW (ref 3.5–5.0)
Alkaline Phosphatase: 69 U/L (ref 38–126)
Anion gap: 10 (ref 5–15)
BUN: 46 mg/dL — ABNORMAL HIGH (ref 8–23)
CO2: 30 mmol/L (ref 22–32)
Calcium: 8.9 mg/dL (ref 8.9–10.3)
Chloride: 97 mmol/L — ABNORMAL LOW (ref 98–111)
Creatinine, Ser: 1.65 mg/dL — ABNORMAL HIGH (ref 0.61–1.24)
GFR calc Af Amer: 41 mL/min — ABNORMAL LOW (ref >60)
GFR calc non Af Amer: 36 mL/min — ABNORMAL LOW (ref >60)
Glucose, Bld: 132 mg/dL — ABNORMAL HIGH (ref 70–99)
Potassium: 3.9 mmol/L (ref 3.5–5.1)
Sodium: 137 mmol/L (ref 135–145)
Total Bilirubin: 2.5 mg/dL — ABNORMAL HIGH (ref 0.3–1.2)
Total Protein: 5.7 g/dL — ABNORMAL LOW (ref 6.5–8.1)

## 2019-09-04 LAB — CBC
HCT: 35.2 % — ABNORMAL LOW (ref 39.0–52.0)
Hemoglobin: 11.7 g/dL — ABNORMAL LOW (ref 13.0–17.0)
MCH: 31.4 pg (ref 26.0–34.0)
MCHC: 33.2 g/dL (ref 30.0–36.0)
MCV: 94.4 fL (ref 80.0–100.0)
Platelets: 176 10*3/uL (ref 150–400)
RBC: 3.73 MIL/uL — ABNORMAL LOW (ref 4.22–5.81)
RDW: 14.1 % (ref 11.5–15.5)
WBC: 19 10*3/uL — ABNORMAL HIGH (ref 4.0–10.5)
nRBC: 0 % (ref 0.0–0.2)

## 2019-09-04 LAB — MAGNESIUM: Magnesium: 1.9 mg/dL (ref 1.7–2.4)

## 2019-09-04 LAB — PHOSPHORUS: Phosphorus: 3.5 mg/dL (ref 2.5–4.6)

## 2019-09-04 MED ORDER — FUROSEMIDE 10 MG/ML IJ SOLN
40.0000 mg | Freq: Every day | INTRAMUSCULAR | Status: DC
  Administered 2019-09-04: 40 mg via INTRAVENOUS
  Filled 2019-09-04: qty 4

## 2019-09-04 NOTE — Progress Notes (Signed)
PROGRESS NOTE  Jesus Beck [id number] DOB: 02-06-1926 DOA: 09/01/2019 PCP: Seward Carol, MD   LOS: 3 days   Brief Narrative / Interim history: 86 year old male with history of chronic kidney disease stage IIIb, HLD, HTN, carotid stenosis status post bilateral endarterectomy, CAD with history of CABG, permanent A. fib with PCM in place and anticoagulated on Eliquis came into the hospital with shortness of breath.  He developed difficulties breathing the day of admission while working with occupational therapy.  He was found to be hypoxic with oxygen level at 78%, and he was placed on nonrebreather by EMS.  He was recently admitted to the hospital 4/15-4/24 gout attack in his left knee as well as pneumonia, was sent home on oxygen, prednisone and antibiotics.  Subjective / 24h Interval events: Reports that his breathing is improved.  Has been complaining of worsening finger pain on the right hand as well as worsening right knee pain, also with swelling and redness  Assessment & Plan: Principal Problem Acute on chronic hypoxic respiratory failure likely in the setting of pulmonary edema, POA-patient was started on Lasix, he is net negative, still tachypneic this morning -2D echo pertinent for normal EF, grade 2 diastolic dysfunction as well as severe pulmonary hypertension -Resume Lasix today as his kidney function has remained stable  Active Problems Fever, leukocytosis-temperature was 100.2, he was tachypneic in the ED. he was empirically placed on doxycycline with concern for residual pneumonia -He now has his right knee bothering him as well as 2 fingers on his right hand, more consistent with gout.  Have started steroids.  His white count still elevated and getting a little bit worse today.  Continue to closely monitor  Hypokalemia-due to diuresis, potassium has normalized 3.9 this morning.  Continue to monitor  Elevated bilirubin-improving.  He has no GI symptoms, alkaline  phosphatase, AST and ALT are all normal.  Right upper quadrant ultrasound unremarkable.  Unclear etiology,?  Undiagnosed history of Rosanna Randy syndrome  Acute on chronic diastolic 0000000 echo with grade 2 diastolic dysfunction  Chronic kidney disease stage IIIb-Baseline creatinine ranging from 1.5-2.0 back in 2014.  Creatinine currently acceptable, resume Lasix, repeat tomorrow morning.  Coronary artery disease with history of CABG-he is stable, troponin slightly elevated but overall flat, not in a pattern consistent with ACS.  No chest pain.  Continue statin, Imdur  Permanent A. fib-pacemaker in place-continue anticoagulation with Eliquis  Essential hypertension-continue Norvasc, furosemide, Imdur  Hyperlipidemia-continue statin  Recently gouty attack left knee-now with increased pain on the right knee and fingers of the right hand.  Right on steroids    Scheduled Meds: . amLODipine  10 mg Oral Daily  . apixaban  2.5 mg Oral BID  . doxycycline  100 mg Oral Q12H  . furosemide  40 mg Intravenous Daily  . isosorbide mononitrate  90 mg Oral Daily  . predniSONE  20 mg Oral Q breakfast  . simvastatin  20 mg Oral q1800  . sodium chloride flush  3 mL Intravenous Q12H   Continuous Infusions:  PRN Meds:.acetaminophen, albuterol, nitroGLYCERIN, ondansetron **OR** ondansetron (ZOFRAN) IV, traMADol  DVT prophylaxis: Eliquis Code Status: Full code Family Communication: Updated daughter at bedside on 5/2  Status is: Inpatient  Remains inpatient appropriate because:White count still increasing, back on IV Lasix  Dispo: The patient is from: Home              Anticipated d/c is to: Home  Anticipated d/c date is: 2 days              Patient currently is not medically stable to d/c.  Consultants:  None   Procedures:  2D echo  Microbiology  None   Antimicrobials: Doxycycline 5/1 >>     Objective: Vitals:   09/04/19 0059 09/04/19 0102 09/04/19 0509 09/04/19 0800  BP:   (!) 144/87 (!) 142/57 (!) 146/66  Pulse:  69 62 60  Resp:  19 19 18   Temp:   97.8 F (36.6 C) 97.8 F (36.6 C)  TempSrc:   Oral   SpO2:  99% 90% (!) 84%  Weight: 67 kg     Height:        Intake/Output Summary (Last 24 hours) at 09/04/2019 [id number] Last data filed at 09/04/2019 [id number] Gross per 24 hour  Intake 740 ml  Output 1400 ml  Net -660 ml   Filed Weights   09/02/19 0158 09/03/19 0417 09/04/19 0059  Weight: 67.8 kg 62.4 kg 67 kg    Examination:  Constitutional: Appears comfortable but tachypneic at times Eyes: No scleral icterus ENMT: Moist mucous membranes Neck: normal, supple Respiratory: Diminished at the bases but clear, no wheezing Cardiovascular: Regular rate and rhythm, no murmurs, trace pretibial edema Abdomen: Soft, nontender, nondistended, positive bowel sounds Musculoskeletal: no clubbing / cyanosis.  Skin: No rashes seen Neurologic: No focal deficits   Data Reviewed: I have independently reviewed following labs and imaging studies   CBC: Recent Labs  Lab 09/01/19 1841 09/02/19 0505 09/03/19 0528 09/04/19 0533  WBC 16.5* 15.7* 18.0* 19.0*  NEUTROABS 14.6*  --   --   --   HGB 14.2 12.6* 11.6* 11.7*  HCT 44.1 38.3* 35.4* 35.2*  MCV 95.2 94.6 93.4 94.4  PLT 263 211 189 0000000   Basic Metabolic Panel: Recent Labs  Lab 09/01/19 1841 09/02/19 0505 09/03/19 0528 09/03/19 1625 09/04/19 0533  NA 140 140 137  --  137  K 4.3 3.7 2.8* 4.1 3.9  CL 101 99 95*  --  97*  CO2 24 26 27   --  30  GLUCOSE 126* 123* 119*  --  132*  BUN 32* 29* 33*  --  46*  CREATININE 1.39* 1.51* 1.67*  --  1.65*  CALCIUM 9.1 8.9 8.7*  --  8.9  MG  --   --  1.3*  --  1.9  PHOS  --   --   --   --  3.5   Liver Function Tests: Recent Labs  Lab 09/01/19 1841 09/03/19 0528 09/04/19 0533  AST 21 21 18   ALT 26 18 18   ALKPHOS 84 67 69  BILITOT 2.1* 3.4* 2.5*  PROT 6.7 5.8* 5.7*  ALBUMIN 3.1* 2.4* 2.3*   Coagulation Profile: No results for input(s): INR, PROTIME in the last  168 hours. HbA1C: No results for input(s): HGBA1C in the last 72 hours. CBG: No results for input(s): GLUCAP in the last 168 hours.  Recent Results (from the past 240 hour(s))  Blood culture (routine x 2)     Status: None (Preliminary result)   Collection Time: 09/01/19  6:41 PM   Specimen: BLOOD  Result Value Ref Range Status   Specimen Description BLOOD RIGHT ANTECUBITAL  Final   Special Requests   Final    BOTTLES DRAWN AEROBIC AND ANAEROBIC Blood Culture adequate volume   Culture   Final    NO GROWTH 3 DAYS Performed at [facility], [address]  [place], [place] [address]    Report Status PENDING  Incomplete  Blood culture (routine x 2)     Status: None (Preliminary result)   Collection Time: 09/01/19  6:41 PM   Specimen: BLOOD LEFT HAND  Result Value Ref Range Status   Specimen Description BLOOD LEFT HAND  Final   Special Requests   Final    BOTTLES DRAWN AEROBIC AND ANAEROBIC Blood Culture results may not be optimal due to an inadequate volume of blood received in culture bottles   Culture   Final    NO GROWTH 3 DAYS Performed at [facility], [place] [address], [place], [place] [address]    Report Status PENDING  Incomplete  Respiratory Panel by RT PCR (Flu A&B, Covid) - Nasopharyngeal Swab     Status: None   Collection Time: 09/01/19  7:10 PM   Specimen: Nasopharyngeal Swab  Result Value Ref Range Status   SARS Coronavirus 2 by RT PCR NEGATIVE NEGATIVE Final    Comment: (NOTE) SARS-CoV-2 target nucleic acids are NOT DETECTED. The SARS-CoV-2 RNA is generally detectable in upper respiratoy specimens during the acute phase of infection. The lowest concentration of SARS-CoV-2 viral copies this assay can detect is 131 copies/mL. A negative result does not preclude SARS-Cov-2 infection and should not be used as the sole basis for treatment or other patient management decisions. A negative result may occur with  improper specimen collection/handling,  submission of specimen other than nasopharyngeal swab, presence of viral mutation(s) within the areas targeted by this assay, and inadequate number of viral copies (<131 copies/mL). A negative result must be combined with clinical observations, patient history, and epidemiological information. The expected result is Negative. Fact Sheet for Patients:  PinkCheek.be Fact Sheet for Healthcare Providers:  GravelBags.it This test is not yet ap proved or cleared by the [place] FDA and  has been authorized for detection and/or diagnosis of SARS-CoV-2 by FDA under an Emergency Use Authorization (EUA). This EUA will remain  in effect (meaning this test can be used) for the duration of the COVID-19 declaration under Section 564(b)(1) of the Act, 21 U.S.C. section 360bbb-3(b)(1), unless the authorization is terminated or revoked sooner.    Influenza A by PCR NEGATIVE NEGATIVE Final   Influenza B by PCR NEGATIVE NEGATIVE Final    Comment: (NOTE) The Xpert Xpress SARS-CoV-2/FLU/RSV assay is intended as an aid in  the diagnosis of influenza from Nasopharyngeal swab specimens and  should not be used as a sole basis for treatment. Nasal washings and  aspirates are unacceptable for Xpert Xpress SARS-CoV-2/FLU/RSV  testing. Fact Sheet for Patients: PinkCheek.be Fact Sheet for Healthcare Providers: GravelBags.it This test is not yet approved or cleared by the [place] FDA and  has been authorized for detection and/or diagnosis of SARS-CoV-2 by  FDA under an Emergency Use Authorization (EUA). This EUA will remain  in effect (meaning this test can be used) for the duration of the  Covid-19 declaration under Section 564(b)(1) of the Act, 21  U.S.C. section 360bbb-3(b)(1), unless the authorization is  terminated or revoked. Performed at [facility], [place] [address],  [place], [place] [address]      Radiology Studies: DG Chest 1 View  Result Date: 09/03/2019 CLINICAL DATA:  Leukocytosis. EXAM: CHEST  1 VIEW COMPARISON:  09/01/2019 FINDINGS: Left-sided pacemaker unchanged. Elevated right hemidiaphragm without significant change. Interval improvement in left base opacification likely improving effusion/atelectasis. Stable cardiomegaly. Remainder the exam is unchanged. IMPRESSION: Interval improvement left base  opacification likely improving effusion/atelectasis. Stable cardiomegaly. Stable elevation right hemidiaphragm. Electronically Signed   By: Marin Olp M.D.   On: 09/03/2019 11:09    Marzetta Board, MD, PhD [facility]  Between 7 am - 7 pm I am available, please contact me via Amion or Securechat  Between 7 pm - 7 am I am not available, please contact night coverage MD/APP via Amion

## 2019-09-04 NOTE — TOC Initial Note (Addendum)
Transition of Care [facility]) - Initial/Assessment Note    Patient Details  Name: Jesus Beck MRN: [id number] Date of Birth: [birth year]  Transition [facility]) CM/SW Contact:    Zenon Mayo, RN Phone Number: 09/04/2019, 3:34 PM  Clinical Narrative:                 NCM spoke with daughter, Caren Griffins, she states they are active with [facility] and would like to continue with [facility]. They do not want SNF. This was confirmd with Tommi Rumps he states they are with [facility].  Daughter states he has home oxygen thru [place], w/chair, , walker, bsc, hospital bed.  He will need ambulance transport at discharge, because he will need to get up the stairs to his bed.    Expected Discharge Plan: [place]     Patient Goals and CMS Choice Patient states their goals for this hospitalization and ongoing recovery are:: get better   Choice offered to / list presented to : Adult Children  Expected Discharge Plan and Services Expected Discharge Plan: [place]   Discharge Planning Services: CM Consult Post Acute Care Choice: Resumption of Svcs/PTA Provider Living arrangements for the past 2 months: Single Family Home                           HH Arranged: RN, Disease Management, PT, OT HH Agency: Uncertain Date [facility] Agency Contacted: 09/04/19 Time [place]: 1532 Representative spoke with at [place]  Prior Living Arrangements/Services Living arrangements for the past 2 months: [place] Lives with:: Adult Children Patient language and need for interpreter reviewed:: Yes Do you feel safe going back to the place where you live?: Yes      Need for Family Participation in Patient Care: Yes (Comment) Care giver support system in place?: Yes (comment) Current home services: Home PT, Home OT, Home RN, Homehealth aide, DME(w/chair, walker, bsc, hosp bed, oxygen, RN, PT,OT, AIDE) Criminal Activity/Legal  Involvement Pertinent to Current Situation/Hospitalization: No - Comment as needed  Activities of Daily Living      Permission Sought/Granted                  Emotional Assessment       Orientation: : Oriented to Self, Oriented to Place, Oriented to  Time, Oriented to Situation Alcohol / Substance Use: Not Applicable Psych Involvement: No (comment)  Admission diagnosis:  Hypoxia [R09.02] Respiratory failure with hypoxia ([place]) [J96.91] HCAP (healthcare-associated pneumonia) [J18.9] Patient Active Problem List   Diagnosis Date Noted  . Hypoxia   . Respiratory failure with hypoxia ([place]) 09/01/2019  . Chronic respiratory insufficiency 08/22/2019  . AMS (altered mental status) 08/17/2019  . Thrombocytopenia ([place]) 08/17/2019  . Acute on chronic kidney failure ([place]) 08/17/2019  . Joint pain 08/17/2019  . Acute hypoxemic respiratory failure ([place]) 08/17/2019  . Pacemaker 11/26/2014  . Atrial fibrillation with slow ventricular response ([place]) 08/27/2014  . Symptomatic bradycardia 08/27/2014  . CHB (complete heart block) ([place]) 08/27/2014  . Atrial fibrillation ([place])   . CAD (coronary artery disease)   . Carotid artery stenosis   . RBBB   . HTN (hypertension)   . Hypercholesteremia    PCP:  Seward Carol, MD Pharmacy:   [facility], [place] [address] AT [address] [place]  [place] [address] Phone: [phone] Fax: [phone]     Social Determinants of Health (SDOH) Interventions    Readmission Risk Interventions No flowsheet data found.

## 2019-09-04 NOTE — Consult Note (Signed)
   [place] [place] Inpatient Consult   09/04/2019  Pryor Revilla Welte 08-03-1925 [id number]   Patient screened for high risk score for unplanned readmission score  and for hospitalizations to check if potential [place].   Review of patient's medical record reveals patient is a less than 30 day readmission.   Patient was active in the [facility] program prior to admission.  Plan:  Follow Inpatient [facility] team leaders for post hospital needs. Patient is in the Medicare Next Gen Accountable Care Organization [ACO].   Continue to follow progress and disposition to assess for post hospital care management needs.    Please place a [facility] Care Management consult as appropriate and for questions contact:   Natividad Brood, RN BSN [facility] Liaison  [phone] business mobile phone Toll free office [phone]  Fax number: [phone] [email or web address]

## 2019-09-04 NOTE — Progress Notes (Signed)
Physical Therapy Treatment Patient Details Name: Jesus Beck MRN: [id number] DOB: 1926-02-09 Today's Date: 09/04/2019    History of Present Illness 86 year old male with history of chronic kidney disease stage IIIb, HLD, HTN, carotid stenosis status post bilateral endarterectomy, CAD with history of CABG, permanent A. fib with PCM in place and anticoagulated on Eliquis came into the hospital with shortness of breath.  He developed difficulties breathing the day of admission while working with occupational therapy.  He was found to be hypoxic with oxygen level at 78%, and he was placed on nonrebreather by EMS.  He was recently admitted to the hospital 4/15-4/24 gout attack in his left knee as well as pneumonia, was sent home on oxygen, prednisone and antibiotics.    PT Comments    Pt admitted with above diagnosis. Pt continues to need heavy mod assist for transfers. Daughter states she can assist pt at home.  Still recommend SNF but if they refuse HH PT and HHOT recommended.  Pt heavy mod assist today of 2.  Daughter states that gout is bothering pt currently.   Pt currently with functional limitations due to balance and endurance deficits. Pt will benefit from skilled PT to increase their independence and safety with mobility to allow discharge to the venue listed below.    Follow Up Recommendations  SNF;Supervision/Assistance - 24 hour(vs home health PT pending progress)     Equipment Recommendations  Wheelchair cushion (measurements PT);Wheelchair (measurements PT);Hospital bed    Recommendations for Other Services       Precautions / Restrictions Precautions Precautions: Fall;Other (comment) Precaution Comments: watch sats Restrictions Weight Bearing Restrictions: No    Mobility  Bed Mobility Overal bed mobility: Needs Assistance Bed Mobility: Sit to Supine       Sit to supine: Min guard   General bed mobility comments: No assist to get back into bed.    Transfers Overall  transfer level: Needs assistance Equipment used: Rolling walker (2 wheeled) Transfers: Sit to/from Stand Sit to Stand: Max assist         General transfer comment: On arrival, pt was on 3N1 with nurse in room having BM.  Needed VCs for hand placement and positioning, max assist to power up to standing 4 attempts before reaching upright, difficulty with tolerating upright position.  Able to take pivotal steps to sit on bed with max assist.  Stood again and took side steps to [facility] after a rest. Pt with posterior lean and does not stand fully upright.    Ambulation/Gait                 Stairs             Wheelchair Mobility    Modified Rankin (Stroke Patients Only)       Balance Overall balance assessment: Needs assistance Sitting-balance support: Feet supported;No upper extremity supported Sitting balance-Leahy Scale: Fair Sitting balance - Comments: static sitting balance with minguard assist, intermittently looses balance posteriorly   Standing balance support: Bilateral upper extremity supported;During functional activity Standing balance-Leahy Scale: Poor Standing balance comment: reliant on UE support on RW and external support                            Cognition Arousal/Alertness: Awake/alert Behavior During Therapy: WFL for tasks assessed/performed Overall Cognitive Status: No family/caregiver present to determine baseline cognitive functioning Area of Impairment: Memory;Awareness;Problem solving;Safety/judgement;Orientation  Orientation Level: Disoriented to;Situation;Time Current Attention Level: Sustained Memory: Decreased short-term memory   Safety/Judgement: Decreased awareness of safety Awareness: Emergent Problem Solving: Difficulty sequencing;Requires verbal cues General Comments: pt required frequent and consistent cues during mobility;pt with slow processing      Exercises General Exercises - Lower  Extremity Ankle Circles/Pumps: AROM;Both;10 reps;Supine Long Arc Quad: AROM;Both;10 reps;Seated Hip Flexion/Marching: AROM;Both;10 reps;Seated    General Comments General comments (skin integrity, edema, etc.): Pt with VSS.        Pertinent Vitals/Pain Pain Assessment: Faces Faces Pain Scale: Hurts even more Pain Location: back and bilateral ankle pain, knees Pain Descriptors / Indicators: Guarding Pain Intervention(s): Monitored during session;Limited activity within patient's tolerance;Repositioned    Home Living                      Prior Function            PT Goals (current goals can now be found in the care plan section) Acute Rehab PT Goals Patient Stated Goal: home Time For Goal Achievement: 09/16/19 Potential to Achieve Goals: Good Progress towards PT goals: Progressing toward goals    Frequency    Min 3X/week      PT Plan Current plan remains appropriate    Co-evaluation              AM-PAC PT "6 Clicks" Mobility   Outcome Measure  Help needed turning from your back to your side while in a flat bed without using bedrails?: None Help needed moving from lying on your back to sitting on the side of a flat bed without using bedrails?: A Little Help needed moving to and from a bed to a chair (including a wheelchair)?: A Lot Help needed standing up from a chair using your arms (e.g., wheelchair or bedside chair)?: A Lot Help needed to walk in hospital room?: Total Help needed climbing 3-5 steps with a railing? : Total 6 Click Score: 13    End of Session Equipment Utilized During Treatment: Gait belt;Oxygen Activity Tolerance: Patient tolerated treatment well Patient left: with call bell/phone within reach;in bed;with family/visitor present;with bed alarm set Nurse Communication: Mobility status PT Visit Diagnosis: Unsteadiness on feet (R26.81);Muscle weakness (generalized) (M62.81);Difficulty in walking, not elsewhere classified  (R26.2);Pain Pain - Right/Left: (bilateral) Pain - part of body: Knee     Time: 1427-1450 PT Time Calculation (min) (ACUTE ONLY): 23 min  Charges:  $Gait Training: 8-22 mins $Therapeutic Activity: 8-22 mins                     Jaramiah Bossard W,PT Acute Rehabilitation Services Pager:  [phone]  Office:  [place] 09/04/2019, 4:10 PM

## 2019-09-04 NOTE — TOC Progression Note (Signed)
Transition of Care [facility]) - Progression Note    Patient Details  Name: Jesus Beck MRN: [id number] Date of Birth: 1925/11/24  Transition of Care [facility]) CM/SW Contact  Zenon Mayo, RN Phone Number: 09/04/2019, 3:06 PM  Clinical Narrative:    Patient is active with [facility] for [place], [place], confirmed by [facility]. Admitted with PNA, SOB on 4 liters, conts on iv lasix, has home oxygen. TOC team will cont to follow for dc needs.         Expected Discharge Plan and Services                                                 Social Determinants of Health (SDOH) Interventions    Readmission Risk Interventions No flowsheet data found.

## 2019-09-05 LAB — CBC
HCT: 34.8 % — ABNORMAL LOW (ref 39.0–52.0)
Hemoglobin: 11.5 g/dL — ABNORMAL LOW (ref 13.0–17.0)
MCH: 30.8 pg (ref 26.0–34.0)
MCHC: 33 g/dL (ref 30.0–36.0)
MCV: 93.3 fL (ref 80.0–100.0)
Platelets: 200 10*3/uL (ref 150–400)
RBC: 3.73 MIL/uL — ABNORMAL LOW (ref 4.22–5.81)
RDW: 13.8 % (ref 11.5–15.5)
WBC: 17.7 10*3/uL — ABNORMAL HIGH (ref 4.0–10.5)
nRBC: 0 % (ref 0.0–0.2)

## 2019-09-05 LAB — BASIC METABOLIC PANEL
Anion gap: 10 (ref 5–15)
BUN: 64 mg/dL — ABNORMAL HIGH (ref 8–23)
CO2: 27 mmol/L (ref 22–32)
Calcium: 9.1 mg/dL (ref 8.9–10.3)
Chloride: 96 mmol/L — ABNORMAL LOW (ref 98–111)
Creatinine, Ser: 1.73 mg/dL — ABNORMAL HIGH (ref 0.61–1.24)
GFR calc Af Amer: 39 mL/min — ABNORMAL LOW (ref >60)
GFR calc non Af Amer: 34 mL/min — ABNORMAL LOW (ref >60)
Glucose, Bld: 139 mg/dL — ABNORMAL HIGH (ref 70–99)
Potassium: 3.9 mmol/L (ref 3.5–5.1)
Sodium: 133 mmol/L — ABNORMAL LOW (ref 135–145)

## 2019-09-05 LAB — MAGNESIUM: Magnesium: 1.8 mg/dL (ref 1.7–2.4)

## 2019-09-05 MED ORDER — FUROSEMIDE 40 MG PO TABS
40.0000 mg | ORAL_TABLET | Freq: Every day | ORAL | Status: DC
  Administered 2019-09-06: 08:00:00 40 mg via ORAL
  Filled 2019-09-05 (×2): qty 1

## 2019-09-05 NOTE — Progress Notes (Signed)
PROGRESS NOTE  Jesus Beck [id number] DOB: [birth year] DOA: 09/01/2019 PCP: Seward Carol, MD   LOS: 4 days   Brief Narrative / Interim history: 86 year old male with history of chronic kidney disease stage IIIb, HLD, HTN, carotid stenosis status post bilateral endarterectomy, CAD with history of CABG, permanent A. fib with PCM in place and anticoagulated on Eliquis came into the hospital with shortness of breath.  He developed difficulties breathing the day of admission while working with occupational therapy.  He was found to be hypoxic with oxygen level at 78%, and he was placed on nonrebreather by EMS.  He was recently admitted to the hospital 4/15-4/24 gout attack in his left knee as well as pneumonia, was sent home on oxygen, prednisone and antibiotics.  He was placed here on diuresis, also developed gouty attack to his right knee and right hand in the hospital for which she was started on steroids  Subjective / 24h Interval events: Breathing is improved, appreciates that his right knee is getting better with the steroids.  Denies any chest pain, abdominal pain, nausea or vomiting  Assessment & Plan: Principal Problem Acute on chronic hypoxic respiratory failure likely in the setting of pulmonary edema, POA-patient diuresed with IV Lasix, net -2.9 L this morning, clinically volume status improved with less lower extremity edema and improved breathing.  He still remains more hypoxic, he is on 4 L nasal cannula while at home he is normally on 2.  Attempt to wean off to 2 L.  Due to creatinine elevation his Lasix will be converted to oral, of note he does not appear to be on any diuretics at home and will likely need Lasix on discharge.  Underwent 2D echo on 5/2 which showed normal EF and grade 2 diastolic dysfunction with severe pulmonary hypertension.  Discussed extensively with the patient's daughter regarding daily weights, low-sodium diet  Active Problems Fever, leukocytosis-temperature  was 100.2, he was tachypneic in the ED. he was empirically placed on doxycycline with concern for residual pneumonia from his prior hospital stay. -On 5/2 he developed right knee pain along with pain and swelling of 2 fingers of the right hand, and was started on steroids.  He also had a significant leukocytosis with white count up to 19 K likely due to gout.  His symptoms are improving however still has difficulties with his right knee while working with PT.  Continue steroids  Hypokalemia-due to diuresis, potassium has normalized at 3.9  Elevated bilirubin-improving.  He has no GI symptoms, alkaline phosphatase, AST and ALT are all normal.  Right upper quadrant ultrasound unremarkable.  Unclear etiology,?  Undiagnosed history of Rosanna Randy syndrome  Acute on chronic diastolic 0000000 echo with grade 2 diastolic dysfunction  Chronic kidney disease stage IIIb-Baseline creatinine ranging from 1.5-2.0 back in 2014.  Creatinine slightly higher with Lasix at 1.7, BUN increased to 64, hold IV Lasix and transition to p.o.  Coronary artery disease with history of CABG-he is stable, troponin slightly elevated but overall flat, not in a pattern consistent with ACS.  No chest pain.  Continue statin, Imdur  Permanent A. fib-pacemaker in place-continue anticoagulation with Eliquis  Essential hypertension-continue Norvasc, furosemide, Imdur  Hyperlipidemia-continue statin  Recently gouty attack left knee-now with increased pain on the right knee and fingers of the right hand.  Right on steroids    Scheduled Meds: . amLODipine  10 mg Oral Daily  . apixaban  2.5 mg Oral BID  . doxycycline  100 mg Oral Q12H  . [  START ON 09/06/2019] furosemide  40 mg Oral Daily  . isosorbide mononitrate  90 mg Oral Daily  . predniSONE  20 mg Oral Q breakfast  . simvastatin  20 mg Oral q1800  . sodium chloride flush  3 mL Intravenous Q12H   Continuous Infusions:  PRN Meds:.acetaminophen, albuterol, nitroGLYCERIN,  ondansetron **OR** ondansetron (ZOFRAN) IV, traMADol  DVT prophylaxis: Eliquis Code Status: Full code Family Communication: Updated daughter at bedside on 5/2  Status is: Inpatient  Remains inpatient appropriate because: physical therapy recommends SNF however patient's daughter wants to take him home with home health.  He is improving, anticipate discharge 24-48 hours pending improvement in oxygenation levels as well as improvement in his right knee pain  Dispo: The patient is from: Home              Anticipated d/c is to: Home              Anticipated d/c date is: 1 day              Patient currently is not medically stable to d/c.  Consultants:  None   Procedures:  2D echo  Microbiology  None   Antimicrobials: Doxycycline 5/1 >>    Objective: Vitals:   09/05/19 0330 09/05/19 0827 09/05/19 1000 09/05/19 1034  BP: (!) 157/64 (!) 138/59  122/66  Pulse: 65 60  (!) 59  Resp: 18 (!) 21 18 20   Temp: 97.7 F (36.5 C)   98.4 F (36.9 C)  TempSrc: Oral   Oral  SpO2: 90% 92% 98% 95%  Weight: 65.3 kg     Height:        Intake/Output Summary (Last 24 hours) at 09/05/2019 1051 Last data filed at 09/05/2019 0900 Gross per 24 hour  Intake 720 ml  Output 500 ml  Net 220 ml   Filed Weights   09/03/19 0417 09/04/19 0059 09/05/19 0330  Weight: 62.4 kg 67 kg 65.3 kg    Examination:  Constitutional: No apparent distress, appears comfortable Eyes: No scleral icterus ENMT: Moist mucous membranes Neck: normal, supple Respiratory: Diminished at the bases but clear, no wheezing heard Cardiovascular: Regular rate and rhythm, no murmurs, trace pretibial edema-significantly improved Abdomen: Soft, nontender, nondistended, positive bowel sounds Musculoskeletal: no clubbing / cyanosis.  Skin: No rashes seen Neurologic: No focal deficits  Data Reviewed: I have independently reviewed following labs and imaging studies   CBC: Recent Labs  Lab 09/01/19 1841 09/02/19 0505 09/03/19  0528 09/04/19 0533 09/05/19 0436  WBC 16.5* 15.7* 18.0* 19.0* 17.7*  NEUTROABS 14.6*  --   --   --   --   HGB 14.2 12.6* 11.6* 11.7* 11.5*  HCT 44.1 38.3* 35.4* 35.2* 34.8*  MCV 95.2 94.6 93.4 94.4 93.3  PLT 263 211 189 176 [id number]   Basic Metabolic Panel: Recent Labs  Lab 09/01/19 1841 09/01/19 1841 09/02/19 0505 09/03/19 0528 09/03/19 1625 09/04/19 0533 09/05/19 0436  NA 140  --  140 137  --  137 133*  K 4.3   < > 3.7 2.8* 4.1 3.9 3.9  CL 101  --  99 95*  --  97* 96*  CO2 24  --  26 27  --  30 27  GLUCOSE 126*  --  123* 119*  --  132* 139*  BUN 32*  --  29* 33*  --  46* 64*  CREATININE 1.39*  --  1.51* 1.67*  --  1.65* 1.73*  CALCIUM 9.1  --  8.9 8.7*  --  8.9 9.1  MG  --   --   --  1.3*  --  1.9 1.8  PHOS  --   --   --   --   --  3.5  --    < > = values in this interval not displayed.   Liver Function Tests: Recent Labs  Lab 09/01/19 1841 09/03/19 0528 09/04/19 0533  AST 21 21 18   ALT 26 18 18   ALKPHOS 84 67 69  BILITOT 2.1* 3.4* 2.5*  PROT 6.7 5.8* 5.7*  ALBUMIN 3.1* 2.4* 2.3*   Coagulation Profile: No results for input(s): INR, PROTIME in the last 168 hours. HbA1C: No results for input(s): HGBA1C in the last 72 hours. CBG: No results for input(s): GLUCAP in the last 168 hours.  Recent Results (from the past 240 hour(s))  Blood culture (routine x 2)     Status: None (Preliminary result)   Collection Time: 09/01/19  6:41 PM   Specimen: BLOOD  Result Value Ref Range Status   Specimen Description BLOOD RIGHT ANTECUBITAL  Final   Special Requests   Final    BOTTLES DRAWN AEROBIC AND ANAEROBIC Blood Culture adequate volume   Culture   Final    NO GROWTH 4 DAYS Performed at [facility], [address] [place], [place] [address]    Report Status PENDING  Incomplete  Blood culture (routine x 2)     Status: None (Preliminary result)   Collection Time: 09/01/19  6:41 PM   Specimen: BLOOD LEFT HAND  Result Value Ref Range Status   Specimen  Description BLOOD LEFT HAND  Final   Special Requests   Final    BOTTLES DRAWN AEROBIC AND ANAEROBIC Blood Culture results may not be optimal due to an inadequate volume of blood received in culture bottles   Culture   Final    NO GROWTH 4 DAYS Performed at [facility], [place] [address] [place],  [address]    Report Status PENDING  Incomplete  Respiratory Panel by RT PCR (Flu A&B, Covid) - Nasopharyngeal Swab     Status: None   Collection Time: 09/01/19  7:10 PM   Specimen: Nasopharyngeal Swab  Result Value Ref Range Status   SARS Coronavirus 2 by RT PCR NEGATIVE NEGATIVE Final    Comment: (NOTE) SARS-CoV-2 target nucleic acids are NOT DETECTED. The SARS-CoV-2 RNA is generally detectable in upper respiratoy specimens during the acute phase of infection. The lowest concentration of SARS-CoV-2 viral copies this assay can detect is 131 copies/mL. A negative result does not preclude SARS-Cov-2 infection and should not be used as the sole basis for treatment or other patient management decisions. A negative result may occur with  improper specimen collection/handling, submission of specimen other than nasopharyngeal swab, presence of viral mutation(s) within the areas targeted by this assay, and inadequate number of viral copies (<131 copies/mL). A negative result must be combined with clinical observations, patient history, and epidemiological information. The expected result is Negative. Fact Sheet for Patients:  PinkCheek.be Fact Sheet for Healthcare Providers:  GravelBags.it This test is not yet ap proved or cleared by the [place] FDA and  has been authorized for detection and/or diagnosis of SARS-CoV-2 by FDA under an Emergency Use Authorization (EUA). This EUA will remain  in effect (meaning this test can be used) for the duration of the COVID-19 declaration under Section 564(b)(1) of the Act, 21 U.S.C.  section 360bbb-3(b)(1), unless the authorization is terminated or revoked sooner.  Influenza A by PCR NEGATIVE NEGATIVE Final   Influenza B by PCR NEGATIVE NEGATIVE Final    Comment: (NOTE) The Xpert Xpress SARS-CoV-2/FLU/RSV assay is intended as an aid in  the diagnosis of influenza from Nasopharyngeal swab specimens and  should not be used as a sole basis for treatment. Nasal washings and  aspirates are unacceptable for Xpert Xpress SARS-CoV-2/FLU/RSV  testing. Fact Sheet for Patients: PinkCheek.be Fact Sheet for Healthcare Providers: GravelBags.it This test is not yet approved or cleared by the [place] FDA and  has been authorized for detection and/or diagnosis of SARS-CoV-2 by  FDA under an Emergency Use Authorization (EUA). This EUA will remain  in effect (meaning this test can be used) for the duration of the  Covid-19 declaration under Section 564(b)(1) of the Act, 21  U.S.C. section 360bbb-3(b)(1), unless the authorization is  terminated or revoked. Performed at [facility], [place] [address] [place],  [address]      Radiology Studies: No results found.  Marzetta Board, MD, PhD [facility]  Between 7 am - 7 pm I am available, please contact me via Amion or Securechat  Between 7 pm - 7 am I am not available, please contact night coverage MD/APP via Amion

## 2019-09-05 NOTE — Plan of Care (Signed)
  Problem: Health Behavior/Discharge Planning: Goal: Ability to manage health-related needs will improve Outcome: Progressing   Problem: Clinical Measurements: Goal: Will remain free from infection Outcome: Progressing Goal: Diagnostic test results will improve Outcome: Progressing Goal: Respiratory complications will improve Outcome: Progressing Note: Able to wean down to [place] 3.5 L from HFNC 5L   Problem: Nutrition: Goal: Adequate nutrition will be maintained Outcome: Progressing   Problem: Pain Managment: Goal: General experience of comfort will improve Outcome: Progressing

## 2019-09-06 ENCOUNTER — Inpatient Hospital Stay (HOSPITAL_COMMUNITY): Payer: Medicare Other

## 2019-09-06 DIAGNOSIS — J189 Pneumonia, unspecified organism: Secondary | ICD-10-CM | POA: Diagnosis not present

## 2019-09-06 LAB — HEPATIC FUNCTION PANEL
ALT: 21 U/L (ref 0–44)
AST: 21 U/L (ref 15–41)
Albumin: 2.3 g/dL — ABNORMAL LOW (ref 3.5–5.0)
Alkaline Phosphatase: 74 U/L (ref 38–126)
Bilirubin, Direct: 0.5 mg/dL — ABNORMAL HIGH (ref 0.0–0.2)
Indirect Bilirubin: 1.3 mg/dL — ABNORMAL HIGH (ref 0.3–0.9)
Total Bilirubin: 1.8 mg/dL — ABNORMAL HIGH (ref 0.3–1.2)
Total Protein: 5.8 g/dL — ABNORMAL LOW (ref 6.5–8.1)

## 2019-09-06 LAB — CBC
HCT: 35.6 % — ABNORMAL LOW (ref 39.0–52.0)
Hemoglobin: 11.9 g/dL — ABNORMAL LOW (ref 13.0–17.0)
MCH: 31 pg (ref 26.0–34.0)
MCHC: 33.4 g/dL (ref 30.0–36.0)
MCV: 92.7 fL (ref 80.0–100.0)
Platelets: 189 10*3/uL (ref 150–400)
RBC: 3.84 MIL/uL — ABNORMAL LOW (ref 4.22–5.81)
RDW: 13.8 % (ref 11.5–15.5)
WBC: 12.7 10*3/uL — ABNORMAL HIGH (ref 4.0–10.5)
nRBC: 0 % (ref 0.0–0.2)

## 2019-09-06 LAB — CULTURE, BLOOD (ROUTINE X 2)
Culture: NO GROWTH
Culture: NO GROWTH
Special Requests: ADEQUATE

## 2019-09-06 LAB — BASIC METABOLIC PANEL
Anion gap: 14 (ref 5–15)
BUN: 73 mg/dL — ABNORMAL HIGH (ref 8–23)
CO2: 26 mmol/L (ref 22–32)
Calcium: 9.2 mg/dL (ref 8.9–10.3)
Chloride: 95 mmol/L — ABNORMAL LOW (ref 98–111)
Creatinine, Ser: 1.62 mg/dL — ABNORMAL HIGH (ref 0.61–1.24)
GFR calc Af Amer: 42 mL/min — ABNORMAL LOW (ref >60)
GFR calc non Af Amer: 37 mL/min — ABNORMAL LOW (ref >60)
Glucose, Bld: 117 mg/dL — ABNORMAL HIGH (ref 70–99)
Potassium: 3.9 mmol/L (ref 3.5–5.1)
Sodium: 135 mmol/L (ref 135–145)

## 2019-09-06 LAB — MAGNESIUM: Magnesium: 1.9 mg/dL (ref 1.7–2.4)

## 2019-09-06 LAB — PHOSPHORUS: Phosphorus: 3.7 mg/dL (ref 2.5–4.6)

## 2019-09-06 MED ORDER — FUROSEMIDE 10 MG/ML IJ SOLN
40.0000 mg | Freq: Once | INTRAMUSCULAR | Status: AC
  Administered 2019-09-06: 40 mg via INTRAVENOUS
  Filled 2019-09-06: qty 4

## 2019-09-06 NOTE — Progress Notes (Signed)
Physical Therapy Treatment Patient Details Name: Jesus Beck MRN: [id number] DOB: 02-23-1926 Today's Date: 09/06/2019    History of Present Illness 86 year old male with history of chronic kidney disease stage IIIb, HLD, HTN, carotid stenosis status post bilateral endarterectomy, CAD with history of CABG, permanent A. fib with PCM in place and anticoagulated on Eliquis came into the hospital with shortness of breath.  He developed difficulties breathing the day of admission while working with occupational therapy.  He was found to be hypoxic with oxygen level at 78%, and he was placed on nonrebreather by EMS.  He was recently admitted to the hospital 4/15-4/24 gout attack in his left knee as well as pneumonia, was sent home on oxygen, prednisone and antibiotics.    PT Comments    Pt admitted with above diagnosis. Pt on 4L face mask on arrival as he desat earlier on [place].  Pt agreed to sit in chair and was able to pivot to chair with RW and +2 mod assist.  Pt performed some LE exercises once inchair.  Daughter still adamant she wants to take pt home. Will continue PT.   Pt currently with functional limitations due to balance and endurance deficits. Pt will benefit from skilled PT to increase their independence and safety with mobility to allow discharge to the venue listed below.     Follow Up Recommendations  SNF;Supervision/Assistance - 24 hour(vs home health PT pending progress)     Equipment Recommendations  Wheelchair cushion (measurements PT);Wheelchair (measurements PT);Hospital bed    Recommendations for Other Services       Precautions / Restrictions Precautions Precautions: Fall;Other (comment) Precaution Comments: watch sats Restrictions Weight Bearing Restrictions: No    Mobility  Bed Mobility Overal bed mobility: Needs Assistance Bed Mobility: Supine to Sit     Supine to sit: Min assist;HOB elevated;Mod assist     General bed mobility comments: min assists to come to  EOB with pt reaching for PT hands.   Transfers Overall transfer level: Needs assistance Equipment used: Rolling walker (2 wheeled) Transfers: Sit to/from [facility] Sit to Stand: Mod assist;+2 physical assistance;From elevated surface Stand pivot transfers: Mod assist;Min assist;+2 physical assistance;From elevated surface       General transfer comment: Pt stood to RW with less assist than last visit. Slightly steadier once up as well.  Able to stand pivot with min to mod assist with pt taking pivotal steps and just a little unsteady with pivot with posterior lean.   Ambulation/Gait                 Stairs             Wheelchair Mobility    Modified Rankin (Stroke Patients Only)       Balance Overall balance assessment: Needs assistance Sitting-balance support: Feet supported;No upper extremity supported Sitting balance-Leahy Scale: Fair Sitting balance - Comments: static sitting balance with minguard assist, intermittently looses balance posteriorly   Standing balance support: Bilateral upper extremity supported;During functional activity Standing balance-Leahy Scale: Poor Standing balance comment: reliant on UE support on RW and external support                            Cognition Arousal/Alertness: Awake/alert Behavior During Therapy: WFL for tasks assessed/performed Overall Cognitive Status: No family/caregiver present to determine baseline cognitive functioning Area of Impairment: Memory;Awareness;Problem solving;Safety/judgement;Orientation  Orientation Level: Disoriented to;Situation;Time Current Attention Level: Sustained Memory: Decreased short-term memory   Safety/Judgement: Decreased awareness of safety Awareness: Emergent Problem Solving: Difficulty sequencing;Requires verbal cues General Comments: pt required frequent and consistent cues during mobility;pt with slow processing       Exercises General Exercises - Lower Extremity Ankle Circles/Pumps: AROM;Both;10 reps;Supine Long Arc Quad: AROM;Both;10 reps;Seated Hip Flexion/Marching: AROM;Both;10 reps;Seated    General Comments General comments (skin integrity, edema, etc.): Pt with VSS with sats >90% wiht activity on 4LO2 face mask (nurse had to put on face mask this am due to desat on [place]),        Pertinent Vitals/Pain Pain Assessment: Faces Faces Pain Scale: Hurts even more Pain Location: back and bilateral ankle pain, knees Pain Descriptors / Indicators: Guarding Pain Intervention(s): Monitored during session;Limited activity within patient's tolerance;Repositioned    Home Living                      Prior Function            PT Goals (current goals can now be found in the care plan section) Acute Rehab PT Goals Patient Stated Goal: home Progress towards PT goals: Progressing toward goals    Frequency    Min 3X/week      PT Plan Current plan remains appropriate    Co-evaluation              AM-PAC PT "6 Clicks" Mobility   Outcome Measure  Help needed turning from your back to your side while in a flat bed without using bedrails?: None Help needed moving from lying on your back to sitting on the side of a flat bed without using bedrails?: A Little Help needed moving to and from a bed to a chair (including a wheelchair)?: A Lot Help needed standing up from a chair using your arms (e.g., wheelchair or bedside chair)?: A Lot Help needed to walk in hospital room?: Total Help needed climbing 3-5 steps with a railing? : Total 6 Click Score: 13    End of Session Equipment Utilized During Treatment: Gait belt;Oxygen Activity Tolerance: Patient limited by fatigue Patient left: with call bell/phone within reach;with family/visitor present;in chair;with chair alarm set Nurse Communication: Mobility status PT Visit Diagnosis: Unsteadiness on feet (R26.81);Muscle weakness  (generalized) (M62.81);Difficulty in walking, not elsewhere classified (R26.2);Pain Pain - Right/Left: (bilateral) Pain - part of body: Knee     Time: [id number] PT Time Calculation (min) (ACUTE ONLY): 18 min  Charges:  $Therapeutic Activity: 8-22 mins                     Yesmin Mutch W,PT Acute Rehabilitation Services Pager:  [phone]  Office:  [place] 09/06/2019, 12:51 PM

## 2019-09-06 NOTE — Progress Notes (Signed)
PROGRESS NOTE  Jesus Beck [id number] DOB: 1925/11/14 DOA: 09/01/2019 PCP: Seward Carol, MD   LOS: 5 days   Brief Narrative / Interim history: 86 year old male with history of chronic kidney disease stage IIIb, HLD, HTN, carotid stenosis status post bilateral endarterectomy, CAD with history of CABG, permanent A. fib with PCM in place and anticoagulated on Eliquis came into the hospital with shortness of breath.  He developed difficulties breathing the day of admission while working with occupational therapy.  He was found to be hypoxic with oxygen level at 78%, and he was placed on nonrebreather by EMS.  He was recently admitted to the hospital 4/15-4/24 gout attack in his left knee as well as pneumonia, was sent home on oxygen, prednisone and antibiotics.  He was placed here on diuresis, also developed gouty attack to his right knee and right hand in the hospital for which she was started on steroids  Subjective / 24h Interval events: Continues to have shortness of breath.  Hypoxic to 86% on 2 L of oxygen.  Requiring simple mask.  Later in the afternoon improved to 3 L.  Assessment & Plan: Principal Problem Acute on chronic hypoxic respiratory failure likely in the setting of pulmonary edema, POA-patient diuresed with IV Lasix, net -2.9 L this morning, clinically volume status improved with less lower extremity edema and improved breathing.  He still remains more hypoxic, he is on 4 L nasal cannula while at home he is normally on 2.  Attempt to wean off to 2 L.  Due to creatinine elevation his Lasix will be converted to oral, of note he does not appear to be on any diuretics at home and will likely need Lasix on discharge.  Underwent 2D echo on 5/2 which showed normal EF and grade 2 diastolic dysfunction with severe pulmonary hypertension.  Discussed extensively with the patient's daughter regarding daily weights, low-sodium diet Will provide 1 more dose of IV Lasix and monitor. Chest x-ray  shows increased vascular congestion.  Active Problems Fever, leukocytosis-temperature was 100.2, he was tachypneic in the ED. he was empirically placed on doxycycline with concern for residual pneumonia from his prior hospital stay. -On 5/2 he developed right knee pain along with pain and swelling of 2 fingers of the right hand, and was started on steroids.  Continue steroids  Hypokalemia-due to diuresis, potassium has normalized at 3.9  Elevated bilirubin-improving.  He has no GI symptoms, alkaline phosphatase, AST and ALT are all normal.  Right upper quadrant ultrasound unremarkable.  Unclear etiology,?  Undiagnosed history of Rosanna Randy syndrome  Acute on chronic diastolic 0000000 echo with grade 2 diastolic dysfunction  Chronic kidney disease stage IIIb-Baseline creatinine ranging from 1.5-2.0 back in 2014.  Creatinine slightly higher with Lasix at 1.7, BUN increased to 64, hold IV Lasix and transition to p.o.  Coronary artery disease with history of CABG-he is stable, troponin slightly elevated but overall flat, not in a pattern consistent with ACS.  No chest pain.  Continue statin, Imdur  Permanent A. fib-pacemaker in place-continue anticoagulation with Eliquis  Essential hypertension-continue Norvasc, furosemide, Imdur  Hyperlipidemia-continue statin  Recently gouty attack left knee-now with increased pain on the right knee and fingers of the right hand.  Right on steroids    Scheduled Meds: . amLODipine  10 mg Oral Daily  . apixaban  2.5 mg Oral BID  . furosemide  40 mg Oral Daily  . isosorbide mononitrate  90 mg Oral Daily  . predniSONE  20 mg Oral Q  breakfast  . simvastatin  20 mg Oral q1800  . sodium chloride flush  3 mL Intravenous Q12H   Continuous Infusions:  PRN Meds:.acetaminophen, albuterol, nitroGLYCERIN, ondansetron **OR** ondansetron (ZOFRAN) IV, traMADol  DVT prophylaxis: Eliquis Code Status: Full code Family Communication: Updated daughter at bedside on 5/2   Status is: Inpatient  Remains inpatient appropriate because: Continues to have increasing shortness of breath and vascular congestion as well as pleural effusion on the chest x-ray.  Dispo: The patient is from: Home              Anticipated d/c is to: Home              Anticipated d/c date is: 1 to 2 days              Patient currently is not medically stable to d/c.  Patient continues to have vascular congestion and increasing shortness of breath.  Increasing pleural effusion seen on the chest x-ray.  Consultants:  None   Procedures:  2D echo  Microbiology  None   Antimicrobials: Doxycycline 5/1 >>    Objective: Vitals:   09/06/19 0414 09/06/19 0801 09/06/19 1117 09/06/19 1940  BP: (!) 143/67 137/61 136/62 140/63  Pulse: 64 (!) 59 60 60  Resp: 19  20 20   Temp: 98.3 F (36.8 C)  98.2 F (36.8 C) (!) 97.4 F (36.3 C)  TempSrc: Oral  Oral Oral  SpO2: 94%  94% (!) 83%  Weight:      Height:        Intake/Output Summary (Last 24 hours) at 09/06/2019 1954 Last data filed at 09/06/2019 1844 Gross per 24 hour  Intake 960 ml  Output 1100 ml  Net -140 ml   Filed Weights   09/04/19 0059 09/05/19 0330 09/06/19 0213  Weight: 67 kg 65.3 kg 65.2 kg    Examination:  Constitutional: No apparent distress, appears comfortable Eyes: No scleral icterus ENMT: Moist mucous membranes Neck: normal, supple Respiratory: Diminished at the bases, bilateral crackles.  Right more than left.  No wheezing heard Cardiovascular: Regular rate and rhythm, no murmurs, trace pretibial edema-significantly improved Abdomen: Soft, nontender, nondistended, positive bowel sounds Musculoskeletal: no clubbing / cyanosis.  Skin: No rashes seen Neurologic: No focal deficits  Data Reviewed: I have independently reviewed following labs and imaging studies   CBC: Recent Labs  Lab 09/01/19 1841 09/01/19 1841 09/02/19 0505 09/03/19 0528 09/04/19 0533 09/05/19 0436 09/06/19 0533  WBC 16.5*   < >  15.7* 18.0* 19.0* 17.7* 12.7*  NEUTROABS 14.6*  --   --   --   --   --   --   HGB 14.2   < > 12.6* 11.6* 11.7* 11.5* 11.9*  HCT 44.1   < > 38.3* 35.4* 35.2* 34.8* 35.6*  MCV 95.2   < > 94.6 93.4 94.4 93.3 92.7  PLT 263   < > 211 189 176 200 189   < > = values in this interval not displayed.   Basic Metabolic Panel: Recent Labs  Lab 09/02/19 0505 09/02/19 0505 09/03/19 0528 09/03/19 1625 09/04/19 0533 09/05/19 0436 09/06/19 0533  NA 140  --  137  --  137 133* 135  K 3.7   < > 2.8* 4.1 3.9 3.9 3.9  CL 99  --  95*  --  97* 96* 95*  CO2 26  --  27  --  30 27 26   GLUCOSE 123*  --  119*  --  132* 139* 117*  BUN 29*  --  33*  --  46* 64* 73*  CREATININE 1.51*  --  1.67*  --  1.65* 1.73* 1.62*  CALCIUM 8.9  --  8.7*  --  8.9 9.1 9.2  MG  --   --  1.3*  --  1.9 1.8 1.9  PHOS  --   --   --   --  3.5  --  3.7   < > = values in this interval not displayed.   Liver Function Tests: Recent Labs  Lab 09/01/19 1841 09/03/19 0528 09/04/19 0533 09/06/19 0533  AST 21 21 18 21   ALT 26 18 18 21   ALKPHOS 84 67 69 74  BILITOT 2.1* 3.4* 2.5* 1.8*  PROT 6.7 5.8* 5.7* 5.8*  ALBUMIN 3.1* 2.4* 2.3* 2.3*   Coagulation Profile: No results for input(s): INR, PROTIME in the last 168 hours. HbA1C: No results for input(s): HGBA1C in the last 72 hours. CBG: No results for input(s): GLUCAP in the last 168 hours.  Recent Results (from the past 240 hour(s))  Blood culture (routine x 2)     Status: None   Collection Time: 09/01/19  6:41 PM   Specimen: BLOOD  Result Value Ref Range Status   Specimen Description BLOOD RIGHT ANTECUBITAL  Final   Special Requests   Final    BOTTLES DRAWN AEROBIC AND ANAEROBIC Blood Culture adequate volume   Culture   Final    NO GROWTH 5 DAYS Performed at [facility], [address], [place], [place] [address]    Report Status 09/06/2019 FINAL  Final  Blood culture (routine x 2)     Status: None   Collection Time: 09/01/19  6:41 PM   Specimen: BLOOD LEFT  HAND  Result Value Ref Range Status   Specimen Description BLOOD LEFT HAND  Final   Special Requests   Final    BOTTLES DRAWN AEROBIC AND ANAEROBIC Blood Culture results may not be optimal due to an inadequate volume of blood received in culture bottles   Culture   Final    NO GROWTH 5 DAYS Performed at [facility], [place] [address] [place], [place] [address]    Report Status 09/06/2019 FINAL  Final  Respiratory Panel by RT PCR (Flu A&B, Covid) - Nasopharyngeal Swab     Status: None   Collection Time: 09/01/19  7:10 PM   Specimen: Nasopharyngeal Swab  Result Value Ref Range Status   SARS Coronavirus 2 by RT PCR NEGATIVE NEGATIVE Final    Comment: (NOTE) SARS-CoV-2 target nucleic acids are NOT DETECTED. The SARS-CoV-2 RNA is generally detectable in upper respiratoy specimens during the acute phase of infection. The lowest concentration of SARS-CoV-2 viral copies this assay can detect is 131 copies/mL. A negative result does not preclude SARS-Cov-2 infection and should not be used as the sole basis for treatment or other patient management decisions. A negative result may occur with  improper specimen collection/handling, submission of specimen other than nasopharyngeal swab, presence of viral mutation(s) within the areas targeted by this assay, and inadequate number of viral copies (<131 copies/mL). A negative result must be combined with clinical observations, patient history, and epidemiological information. The expected result is Negative. Fact Sheet for Patients:  PinkCheek.be Fact Sheet for Healthcare Providers:  GravelBags.it This test is not yet ap proved or cleared by the [place] FDA and  has been authorized for detection and/or diagnosis of SARS-CoV-2 by FDA under an Emergency Use Authorization (EUA). This  EUA will remain  in effect (meaning this test can be used) for the duration of the COVID-19  declaration under Section 564(b)(1) of the Act, 21 U.S.C. section 360bbb-3(b)(1), unless the authorization is terminated or revoked sooner.    Influenza A by PCR NEGATIVE NEGATIVE Final   Influenza B by PCR NEGATIVE NEGATIVE Final    Comment: (NOTE) The Xpert Xpress SARS-CoV-2/FLU/RSV assay is intended as an aid in  the diagnosis of influenza from Nasopharyngeal swab specimens and  should not be used as a sole basis for treatment. Nasal washings and  aspirates are unacceptable for Xpert Xpress SARS-CoV-2/FLU/RSV  testing. Fact Sheet for Patients: PinkCheek.be Fact Sheet for Healthcare Providers: GravelBags.it This test is not yet approved or cleared by the [place] FDA and  has been authorized for detection and/or diagnosis of SARS-CoV-2 by  FDA under an Emergency Use Authorization (EUA). This EUA will remain  in effect (meaning this test can be used) for the duration of the  Covid-19 declaration under Section 564(b)(1) of the Act, 21  U.S.C. section 360bbb-3(b)(1), unless the authorization is  terminated or revoked. Performed at [facility], [place] [address], [place], [place] [address]      Radiology Studies: DG CHEST PORT 1 VIEW  Result Date: 09/06/2019 CLINICAL DATA:  Shortness of breath. EXAM: PORTABLE CHEST 1 VIEW COMPARISON:  09/03/2019 FINDINGS: Single lead pacer. Prior median sternotomy. Moderate right hemidiaphragm elevation. Mild cardiomegaly. Atherosclerosis in the transverse aorta. Suspect a small layering left pleural effusion. No pneumothorax. Pulmonary venous congestion is new or increased. Left perihilar interstitial and airspace disease is new. Left lower lobe airspace disease is progressive. Persistent right base atelectasis. IMPRESSION: Worsened aeration, with new or progressive pulmonary venous congestion. Left perihilar airspace and interstitial opacity could represent alveolar edema or infection.  Worsened left and similar right base airspace disease with similar small left pleural effusion. Aortic Atherosclerosis (ICD10-I70.0). Electronically Signed   By: Abigail Miyamoto M.D.   On: 09/06/2019 10:49    Jesus Beck  [facility]  Between 7 am - 7 pm I am available, please contact me via Amion or Securechat  Between 7 pm - 7 am I am not available, please contact night coverage MD/APP via Amion

## 2019-09-06 NOTE — Progress Notes (Signed)
SATURATION QUALIFICATIONS: (This note is used to comply with regulatory documentation for home oxygen)  Patient Saturations on Room Air at Rest = 85%   Patient Saturations on 3 Liters of oxygen at rest = 91%  Please briefly explain why patient needs home oxygen: Patient desat to 85% on RA

## 2019-09-06 NOTE — Care Management Important Message (Signed)
Important Message  Patient Details  Name: Jesus Beck MRN: [id number] Date of Birth: [birth year]   Medicare Important Message Given:  Yes     Shelda Altes 09/06/2019, 10:20 AM

## 2019-09-07 DIAGNOSIS — J189 Pneumonia, unspecified organism: Secondary | ICD-10-CM | POA: Diagnosis not present

## 2019-09-07 LAB — BASIC METABOLIC PANEL
Anion gap: 10 (ref 5–15)
BUN: 63 mg/dL — ABNORMAL HIGH (ref 8–23)
CO2: 30 mmol/L (ref 22–32)
Calcium: 9 mg/dL (ref 8.9–10.3)
Chloride: 96 mmol/L — ABNORMAL LOW (ref 98–111)
Creatinine, Ser: 1.49 mg/dL — ABNORMAL HIGH (ref 0.61–1.24)
GFR calc Af Amer: 47 mL/min — ABNORMAL LOW (ref >60)
GFR calc non Af Amer: 40 mL/min — ABNORMAL LOW (ref >60)
Glucose, Bld: 110 mg/dL — ABNORMAL HIGH (ref 70–99)
Potassium: 3.4 mmol/L — ABNORMAL LOW (ref 3.5–5.1)
Sodium: 136 mmol/L (ref 135–145)

## 2019-09-07 MED ORDER — FUROSEMIDE 40 MG PO TABS
40.0000 mg | ORAL_TABLET | Freq: Two times a day (BID) | ORAL | Status: DC
  Administered 2019-09-07 – 2019-09-08 (×3): 40 mg via ORAL
  Filled 2019-09-07 (×2): qty 1

## 2019-09-07 MED ORDER — FUROSEMIDE 40 MG PO TABS: ORAL_TABLET | ORAL | 0 refills | Status: AC

## 2019-09-07 MED ORDER — SENNOSIDES-DOCUSATE SODIUM 8.6-50 MG PO TABS
1.0000 | ORAL_TABLET | Freq: Two times a day (BID) | ORAL | Status: DC
  Administered 2019-09-07 – 2019-09-08 (×3): 1 via ORAL
  Filled 2019-09-07 (×3): qty 1

## 2019-09-07 MED ORDER — POTASSIUM CHLORIDE CRYS ER 20 MEQ PO TBCR
40.0000 meq | EXTENDED_RELEASE_TABLET | Freq: Once | ORAL | Status: AC
  Administered 2019-09-07: 40 meq via ORAL
  Filled 2019-09-07: qty 2

## 2019-09-07 MED ORDER — PREDNISONE 10 MG PO TABS: ORAL_TABLET | ORAL | 0 refills | Status: AC

## 2019-09-07 NOTE — TOC Progression Note (Signed)
Transition of Care [facility]) - Progression Note    Patient Details  Name: JOASH FANK MRN: [id number] Date of Birth: 1925/07/23  Transition of [facility]) CM/SW Contact  Zenon Mayo, RN Phone Number: 09/07/2019, 2:15 PM  Clinical Narrative:    Patient and family meeting with Palliative team today, daughter has decided on home hospice, NCM offered choice, she chose [facility]. NCM made referral to Gar Ponto with [facility] for home hospice. He will need ambulance transport at dc.     Expected Discharge Plan: [place]    Expected Discharge Plan and Services Expected Discharge Plan: [place]   Discharge Planning Services: CM Consult Post Acute Care Choice: Resumption of Svcs/PTA Provider Living arrangements for the past 2 months: Single Family Home Expected Discharge Date: 09/07/19                         HH Arranged: RN, Disease Management, PT, OT HH Agency: [place] Date [facility] Agency Contacted: 09/04/19 Time [place]: 1532 Representative spoke with at [place]: [place] ([place]) Interventions    Readmission Risk Interventions No flowsheet data found.

## 2019-09-07 NOTE — Discharge Instructions (Signed)
Heart Failure Action Plan A heart failure action plan helps you understand what to do when you have symptoms of heart failure. Follow the plan that was created by you and your health care provider. Review your plan each time you visit your health care provider. Red zone These signs and symptoms mean you should get medical help right away:  You have trouble breathing when resting.  You have a dry cough that is getting worse.  You have swelling or pain in your legs or abdomen that is getting worse.  You suddenly gain more than 2-3 lb (0.9-1.4 kg) in a day, or more than 5 lb (2.3 kg) in one week. This amount may be more or less depending on your condition.  You have trouble staying awake or you feel confused.  You have chest pain.  You do not have an appetite.  You pass out. If you experience any of these symptoms:  Call your local emergency services (911 in the [place] right away or seek help at the emergency department of the nearest hospital. Yellow zone These signs and symptoms mean your condition may be getting worse and you should make some changes:  You have trouble breathing when you are active or you need to sleep with extra pillows.  You have swelling in your legs or abdomen.  You gain 2-3 lb (0.9-1.4 kg) in one day, or 5 lb (2.3 kg) in one week. This amount may be more or less depending on your condition.  You get tired easily.  You have trouble sleeping.  You have a dry cough. If you experience any of these symptoms:  Contact your health care provider within the next day.  Your health care provider may adjust your medicines. Green zone These signs mean you are doing well and can continue what you are doing:  You do not have shortness of breath.  You have very little swelling or no new swelling.  Your weight is stable (no gain or loss).  You have a normal activity level.  You do not have chest pain or any other new symptoms. Follow these instructions at  home:  Take over-the-counter and prescription medicines only as told by your health care provider.  Weigh yourself daily. Your target weight is __________ lb (__________ kg). ? Call your health care provider if you gain more than __________ lb (__________ kg) in a day, or more than __________ lb (__________ kg) in one week.  Eat a heart-healthy diet. Work with a diet and nutrition specialist (dietitian) to create an eating plan that is best for you.  Keep all follow-up visits as told by your health care provider. This is important. Where to find more information  American Heart Association: www.heart.org Summary  Follow the action plan that was created by you and your health care provider.  Get help right away if you have any symptoms in the Red zone. This information is not intended to replace advice given to you by your health care provider. Make sure you discuss any questions you have with your health care provider. Document Revised: 04/02/2017 Document Reviewed: 05/30/2016 Elsevier Patient Education  2020 Elsevier Inc.  

## 2019-09-07 NOTE — Progress Notes (Signed)
Hydrologist [facility]) Hospital Liaison: RN note    Notified by Transition of Care Manger of patient/family request for [facility] services at home after discharge. Chart and patient information under review by [facility] physician. Hospice eligibility pending currently.    Writer spoke with  Daughter Caren Griffins         to initiate education related to hospice philosophy, services and team approach to care. Caren Griffins verbalized understanding of information given. Per discussion, plan is for discharge to home by PTAR possibly tomorrow 5/7.   Please send signed and completed DNR form home with patient/family. Patient will need prescriptions for discharge comfort medications.     DME needs have been discussed, patient currently has the following equipment in the home: Hospital bed, [facility], walker, Wheelchair Oxygen and spare tanks.  Patient/family requests the following DME for delivery to the home: none  Home address has been verified and is correct in the chart.    Caren Griffins is the family member to contact to arrange time of delivery.     [facility] aware of the above.  Please notify [facility] when patient is ready to leave the unit at discharge. (Call [phone] or [phone] after 5pm.) [facility] information and contact numbers given to [facility] .      Please call with any hospice related questions.     Thank you for this referral.     Clementeen Hoof, RN, [facility] (listed on [place] under Hospice and [place] [facility] [place])  [phone]

## 2019-09-07 NOTE — TOC Progression Note (Addendum)
Transition of Care [facility]) - Progression Note    Patient Details  Name: Jesus Beck MRN: [id number] Date of Birth: Feb 21, 1926  Transition of Care [facility]) CM/SW Contact  Zenon Mayo, RN Phone Number: 09/07/2019, 2:24 PM  Clinical Narrative:    Patient and family meeting with Palliative team today, daughter has decided on home hospice, NCM offered choice, she chose [facility]. NCM made referral to Gar Ponto with [facility] for home hospice. He will need ambulance transport at d tomorrow.   Expected Discharge Plan: Home w Hospice Care Barriers to Discharge: Continued Medical Work up  Expected Discharge Plan and Services Expected Discharge Plan: [place]   Discharge Planning Services: CM Consult Post Acute Care Choice: Hospice Living arrangements for the past 2 months: Single Family Home Expected Discharge Date: 09/07/19               DME Arranged: (hospice will supply the DME) DME Agency: NA       HH Arranged: RN HH Agency: ([facility]) Date HH Agency Contacted: 09/07/19 Time [place]: 1422 Representative spoke with at [place]: [place] Determinants of Health ([place]) Interventions    Readmission Risk Interventions No flowsheet data found.

## 2019-09-07 NOTE — Progress Notes (Addendum)
[facility] Progress Note  Patient: Jesus Beck    [id number]  DOA: 09/01/2019     Date of Service: the patient was seen and examined on 09/07/2019  Chief Complaint  Patient presents with  . Shortness of Breath   Brief hospital course: Past medical history of CKD 3B, HLD, HTN, PVD SP bilateral endarterectomy, CAD SP CABG, permanent A. fib on Eliquis, PPM implant.  Presented with complaints of shortness of breath found to have acute on chronic diastolic CHF.  Recently admitted in the hospital for pneumonia and treated with antibiotics.  Had a gout episode treated with prednisone.  Continues to have recurrent episodes of volume overload.  Developed acute kidney injury with diuresis. Currently further plan is arranging home hospice on discharge.  Assessment and Plan: 1.  Acute on chronic hypoxic respiratory failure.  POA Acute on chronic diastolic CHF Bilateral pleural effusion. Healthcare associated pneumonia Presents with shortness of breath presently currently requiring 3 to 4 L of oxygen. Treated with IV antibiotics.  Culture so far negative.  No fever during the hospital stay. Echocardiogram shows preserved EF with grade 2 diastolic dysfunction with severe pulmonary hypertension. Patient was diuresed well with renal function worsened. When Lasix was reduced patient become volume overloaded again requiring further IV Lasix. Patient has poor reserves and remains at risk for recurrent hospitalization and poor prognosis. Recommended considering hospice on discharge currently stable.  2.  Acute kidney injury on chronic kidney disease stage IIIb. Baseline renal function 1.5-2.0. Renal function worsened now improving. Currently on oral Lasix Monitor  3.  CAD SP CABG No chest pain. Troponin elevated but not consistent with ACS. Monitor. Continue statin and Imdur.  4.  Permanent A. fib Pacemaker implant. Continue anticoagulation with Eliquis for now.  5.  Essential  hypertension Blood pressure stable.  It was soft on 09/06/2019.  Continue Norvasc furosemide and Imdur.  6.  Hyperlipidemia Continue statin for now.  7.  Recent gout. Continue prednisone taper.  8.  Poor p.o. intake. In the setting of ongoing volume overload and poor reserves. Failure to thrive. Continue to monitor.  9.  Hypokalemia. Replaced. Monitor.  10.  Hyperbilirubinemia. Bilirubin remains elevated. Monitor for now.  Likely vascular congestion.  11.  Leukocytosis. Improving.  12.  Goals of care discussion. Had an extensive discussion with daughter. Patient appears to be doing poorly despite aggressive management of heart failure with ongoing volume overload and renal function worsening. BUN is 73 higher than baseline. Patient's prognosis remains poor given his poor reserves. Explained to the daughter that given patient's poor reserves outcome for cardiac arrest will not be favorable. Also explained appears to have ongoing failure to thrive and will likely do poorly and may have a prognosis limited to less than 6 months. Currently has heavy symptom burden with fatigue tiredness pain lethargy anorexia treatment of which will lead to more complication. Offered options for care down the road. Patient currently agreeable for hospice at home. Case management consulted. Plan discharge for 09/08/2019 with home with hospice.  Diet: Dysphagia diet DVT Prophylaxis: Therapeutic Anticoagulation with Eliquis   Advance goals of care discussion: DNR  Family Communication: family was present at bedside, at the time of interview.  The pt provided permission to discuss medical plan with the family. pportunity was given to ask question and all questions were answered satisfactorily.   Disposition:  Status is: Inpatient  Remains inpatient appropriate because:Unsafe d/c plan   Dispo: The patient is from: Home  Anticipated d/c is to: Home              Anticipated d/c date  is: 1 day              Patient currently is medically stable to d/c. Subjective: Continues to have shortness of breath and fatigue.  Remains tired.  No nausea no vomiting.  Remains sleepy as well.  None diarrhea.  Physical Exam: General:  lethargic oriented to place and person.  Appear in moderate distress, affect flat in affect Eyes: PERRL ENT: Oral Mucosa Clear, moist  Neck: Positive JVD,  Cardiovascular: S1 and S2 Present, no Murmur,  Respiratory: increased respiratory effort, Bilateral Air entry equal and Decreased, bilateral  Crackles, no wheezes Abdomen: Bowel Sound present, Soft and no tenderness,  Skin: no rash Extremities: no Pedal edema, no calf tenderness Neurologic: without any new focal findings generalized fatigue Gait not checked due to patient safety concerns  Vitals:   09/07/19 0324 09/07/19 0428 09/07/19 0846 09/07/19 1141  BP:  134/63 123/61 (!) 125/45  Pulse:  62 62 61  Resp:  18  18  Temp:  (!) 97.4 F (36.3 C)  97.8 F (36.6 C)  TempSrc:  Oral  Oral  SpO2:  94%  92%  Weight: 64.3 kg     Height:        Intake/Output Summary (Last 24 hours) at 09/07/2019 1741 Last data filed at 09/07/2019 1300 Gross per 24 hour  Intake 420 ml  Output 1800 ml  Net -1380 ml   Filed Weights   09/05/19 0330 09/06/19 0213 09/07/19 0324  Weight: 65.3 kg 65.2 kg 64.3 kg    Data Reviewed: I have personally reviewed and interpreted daily labs, tele strips, imagings as discussed above. I reviewed all nursing notes, pharmacy notes, vitals, pertinent old records I have discussed plan of care as described above with RN and patient/family.  CBC: Recent Labs  Lab 09/01/19 1841 09/01/19 1841 09/02/19 0505 09/03/19 0528 09/04/19 0533 09/05/19 0436 09/06/19 0533  WBC 16.5*   < > 15.7* 18.0* 19.0* 17.7* 12.7*  NEUTROABS 14.6*  --   --   --   --   --   --   HGB 14.2   < > 12.6* 11.6* 11.7* 11.5* 11.9*  HCT 44.1   < > 38.3* 35.4* 35.2* 34.8* 35.6*  MCV 95.2   < > 94.6 93.4  94.4 93.3 92.7  PLT 263   < > 211 189 176 200 189   < > = values in this interval not displayed.   Basic Metabolic Panel: Recent Labs  Lab 09/03/19 0528 09/03/19 0528 09/03/19 1625 09/04/19 0533 09/05/19 0436 09/06/19 0533 09/07/19 0733  NA 137  --   --  137 133* 135 136  K 2.8*   < > 4.1 3.9 3.9 3.9 3.4*  CL 95*  --   --  97* 96* 95* 96*  CO2 27  --   --  30 27 26 30   GLUCOSE 119*  --   --  132* 139* 117* 110*  BUN 33*  --   --  46* 64* 73* 63*  CREATININE 1.67*  --   --  1.65* 1.73* 1.62* 1.49*  CALCIUM 8.7*  --   --  8.9 9.1 9.2 9.0  MG 1.3*  --   --  1.9 1.8 1.9  --   PHOS  --   --   --  3.5  --  3.7  --    < > =  values in this interval not displayed.    Studies: No results found.  Scheduled Meds: . amLODipine  10 mg Oral Daily  . apixaban  2.5 mg Oral BID  . furosemide  40 mg Oral BID  . isosorbide mononitrate  90 mg Oral Daily  . predniSONE  20 mg Oral Q breakfast  . senna-docusate  1 tablet Oral BID  . simvastatin  20 mg Oral q1800  . sodium chloride flush  3 mL Intravenous Q12H   Continuous Infusions: PRN Meds: acetaminophen, albuterol, nitroGLYCERIN, ondansetron **OR** ondansetron (ZOFRAN) IV, traMADol  Time spent: 35 minutes  Author: Berle Mull, MD [facility] 09/07/2019 5:41 PM  To reach On-call, see care teams to locate the attending and reach out to them via [email or web address]. If 7PM-7AM, please contact night-coverage If you still have difficulty reaching the attending provider, please page the [facility] (Director on Call) for [facility] on amion for assistance.

## 2019-09-08 DIAGNOSIS — J9601 Acute respiratory failure with hypoxia: Secondary | ICD-10-CM | POA: Diagnosis not present

## 2019-09-08 MED ORDER — OXYCODONE HCL 20 MG/ML PO CONC: 5.0000 mg | ORAL | Status: DC | PRN

## 2019-09-08 MED ORDER — OXYCODONE HCL 20 MG/ML PO CONC: 5.0000 mg | ORAL | 0 refills | Status: AC | PRN

## 2019-09-08 MED ORDER — LORAZEPAM 2 MG/ML PO CONC: 0.5000 mg | Freq: Four times a day (QID) | ORAL | Status: DC | PRN

## 2019-09-08 MED ORDER — LORAZEPAM 2 MG/ML PO CONC: 0.5000 mg | Freq: Four times a day (QID) | ORAL | 0 refills | Status: AC | PRN

## 2019-09-08 NOTE — Progress Notes (Signed)
Discharge education and medication education given to patient  with teach back.  All questions and concerns answered.  Peripheral IV and telemetry leads removed. All patient belongings given to patient, and [facility] transporters. Patient transported by [facility].

## 2019-09-08 NOTE — Discharge Summary (Addendum)
[facility] Discharge Summary   Patient: Jesus Beck  PCP: Seward Carol, MD  Date of admission: 09/01/2019   Date of discharge:  09/08/2019     Discharge Diagnoses:  Principal diagnosis Acute on chronic diastolic CHF Active Problems:   Atrial fibrillation (HCC)   CAD (coronary artery disease)   HTN (hypertension)   Atrial fibrillation with slow ventricular response (HCC)   CHB (complete heart block) ([place])   Pacemaker   Respiratory failure with hypoxia ([place])   Admitted From: home Disposition:  Home with hospice  Recommendations for Outpatient Follow-up:  1. PCP: establish care with hospice 2. Follow up LABS/TEST:  none  Follow-up Information    Seward Carol, MD. Go on 09/14/2019.   Specialty: Internal Medicine Why: repeaty BMP in 1 week. pt will benefit from advance care planning and care connection. @2 :30pm Contact information: [address] [place] [address] [phone]        [facility] Follow up.   Why: home hospice Contact information: [place] [phone]         Diet recommendation: soft diet  Activity: The patient is advised to gradually reintroduce usual activities, as tolerated  Discharge Condition: stable  Code Status: DNR   History of present illness: As per the H and P dictated on admission, " Jesus Beck is a 86 y.o. male with medical history significant of CKD, HLD, HTN, Carotid stenosis s/p bilateral endarterectomy, CAD, permanent Afib with PCM in place who presents for acute SOB.  Spoke with his daughter Caren Griffins for more information. Per report, patient started to develop worsening SOB around 4pm today.  He was working with occupational therapy when she noticed that his RR was worse.  They checked a temperature and it was elevated and his oxygen level was 78%.  EMS was called and he was placed on a NRB with improvement only to the 80s.  When he  presented to the ED, he had oxygen increased and Pox improved to the low 90s.  His daughter Caren Griffins notes that she also remembered that she was up with him at 2am this morning.  At that time, he was struggling to get up from the restroom.  He had labored breathing and O2 had decreased as well.  She sat with him for an hour to control his breathing and his Pulse Ox improved.  He was his normal self this morning until OT.  He notes a cough which is nonproductive.  Difficulty laying flat.  His BP has been mildly higher at home and he is using oxygen at home.   He was recently admitted to the hospital 08/17/19 - 08/22/19 for gout attack and pneumonia.  He was sent home on oxygen, prednisone and antibiotics.  He had AKI during that time as well. "  Hospital Course:  Summary of his active problems in the hospital is as following.   1.  Acute on chronic hypoxic respiratory failure.  POA Acute on chronic diastolic CHF Bilateral pleural effusion. Healthcare associated pneumonia Presents with shortness of breath presently currently requiring 3 to 4 L of oxygen. Treated with IV antibiotics.  Culture so far negative.  No fever during the hospital stay. Echocardiogram shows preserved EF with grade 2 diastolic dysfunction with severe pulmonary hypertension. Patient was diuresed well with renal function worsened. When Lasix was reduced patient become volume overloaded again requiring further IV Lasix. Patient has poor reserves and remains at risk for recurrent  hospitalization and poor prognosis. Recommended considering hospice on discharge currently stable.  2.  Acute kidney injury on chronic kidney disease stage IIIb. Baseline renal function 1.5-2.0. Renal function worsened now improving. Currently on oral Lasix Monitor  3.  CAD SP CABG No chest pain. Troponin elevated but not consistent with ACS. Monitor. Continue statin and Imdur.  4.  Permanent A. fib Pacemaker implant. Continue  anticoagulation with Eliquis for now.  5.  Essential hypertension Blood pressure stable.  It was soft on 09/06/2019.  Continue Norvasc furosemide and Imdur.  6.  Hyperlipidemia Continue statin for now.  7.  Recent gout. Continue prednisone taper.  8.  Poor p.o. intake. In the setting of ongoing volume overload and poor reserves. Failure to thrive. Continue to monitor.  9.  Hypokalemia. Replaced. Monitor.  10.  Hyperbilirubinemia. Bilirubin remains elevated. Monitor for now.  Likely vascular congestion.  11.  Leukocytosis. Improving.  12.  Goals of care discussion. Had an extensive discussion with daughter. Patient appears to be doing poorly despite aggressive management of heart failure with ongoing volume overload and renal function worsening. BUN is 73 higher than baseline. Patient's prognosis remains poor given his poor reserves. Explained to the daughter that given patient's poor reserves outcome for cardiac arrest will not be favorable. Also explained appears to have ongoing failure to thrive and will likely do poorly and may have a prognosis limited to less than 6 months. Currently has heavy symptom burden with fatigue tiredness pain lethargy anorexia treatment of which will lead to more complication. Offered options for care down the road. Patient currently agreeable for hospice at home. Case management consulted. Plan discharge for 09/08/2019 with home with hospice.  On the day of the discharge the patient's vitals were stable, and no other acute medical condition were reported by patient. the patient was felt safe to be discharge at Home with hospice.  Consultants: none Procedures: Echocardiogram   Discharge Exam: General: Appear in mild distress, no Rash; Oral Mucosa Clear, dry. Cardiovascular: S1 and S2 Present, no Murmur, Respiratory: increased respiratory effort, Bilateral Air entry present and bilateral  Crackles, no wheezes Abdomen: Bowel Sound  present, Soft and no tenderness, no hernia Extremities: trace Pedal edema, no calf tenderness Neurology: alert and oriented to time, place, and person affect appropriate.  Filed Weights   09/06/19 0213 09/07/19 0324 09/08/19 0409  Weight: 65.2 kg 64.3 kg 64.3 kg   Vitals:   09/08/19 0404 09/08/19 0905  BP: (!) 142/71 140/69  Pulse: 69 65  Resp: 20 17  Temp: 97.7 F (36.5 C)   SpO2: 91% 98%    DISCHARGE MEDICATION: Allergies as of 09/08/2019      Reactions   Lipitor [atorvastatin] Hives      Medication List    STOP taking these medications   Colcrys 0.6 MG tablet Generic drug: colchicine   traMADol 50 MG tablet Commonly known as: ULTRAM     TAKE these medications   amLODipine 10 MG tablet Commonly known as: NORVASC Take 1 tablet (10 mg total) by mouth daily.   Eliquis 2.5 MG Tabs tablet Generic drug: apixaban TAKE 1 TABLET(2.5 MG) BY MOUTH TWICE DAILY What changed: See the new instructions.   furosemide 40 MG tablet Commonly known as: LASIX Take 40 mg twice a day for 4 days. from 09/11/2019 take 40 mg daily. Take extra 40 mg tablet for weight gain of 3 lbs in 1 day or 5 lbs in 2 consecutive days   isosorbide mononitrate 60 MG  24 hr tablet Commonly known as: IMDUR TAKE 1 AND 1/2 TABLETS BY MOUTH DAILY What changed: when to take this   LORazepam 2 MG/ML concentrated solution Commonly known as: ATIVAN Take 0.3 mLs (0.6 mg total) by mouth every 6 (six) hours as needed for anxiety.   nitroGLYCERIN 0.4 MG SL tablet Commonly known as: NITROSTAT Place 1 tablet (0.4 mg total) under the tongue every 5 (five) minutes as needed for chest pain.   OMEGA 3 PO Take 2,000 Units by mouth 2 (two) times daily.   oxyCODONE 20 MG/ML concentrated solution Commonly known as: ROXICODONE INTENSOL Take 0.3 mLs (6 mg total) by mouth every 4 (four) hours as needed for severe pain (dyspnea).   predniSONE 10 MG tablet Commonly known as: DELTASONE Take 20mg  daily for 5 days,Take  10mg  daily for 5 days, then stop   simvastatin 20 MG tablet Commonly known as: ZOCOR TAKE 1 TABLET BY MOUTH EVERY NIGHT AT BEDTIME What changed: when to take this            Durable Medical Equipment  (From admission, onward)         Start     Ordered   09/06/19 0716  For home use only DME oxygen  Once    Question Answer Comment  Length of Need Lifetime   Mode or (Route) Nasal cannula   Liters per Minute 4   Frequency Continuous (stationary and portable oxygen unit needed)   Oxygen delivery system Gas      09/06/19 0715         Allergies  Allergen Reactions  . Lipitor [Atorvastatin] Hives   Discharge Instructions    Amb Referral to Palliative Care   Complete by: As directed    Diet - low sodium heart healthy   Complete by: As directed    Increase activity slowly   Complete by: As directed       The results of significant diagnostics from this hospitalization (including imaging, microbiology, ancillary and laboratory) are listed below for reference.    Significant Diagnostic Studies: DG Chest 1 View  Result Date: 09/03/2019 CLINICAL DATA:  Leukocytosis. EXAM: CHEST  1 VIEW COMPARISON:  09/01/2019 FINDINGS: Left-sided pacemaker unchanged. Elevated right hemidiaphragm without significant change. Interval improvement in left base opacification likely improving effusion/atelectasis. Stable cardiomegaly. Remainder the exam is unchanged. IMPRESSION: Interval improvement left base opacification likely improving effusion/atelectasis. Stable cardiomegaly. Stable elevation right hemidiaphragm. Electronically Signed   By: Marin Olp M.D.   On: 09/03/2019 11:09   DG Chest 2 View  Result Date: 08/17/2019 CLINICAL DATA:  Weakness. EXAM: CHEST - 2 VIEW COMPARISON:  08/28/2014 FINDINGS: Single lead pacer device in situ enters via left subclavian approach. Power pack over left chest. Changes of median sternotomy with CABG. Cardiomediastinal contours are stable. Linear scarring  or atelectasis in the right mid chest. Right hemidiaphragmatic elevation similar to prior study. Vague airspace and interstitial opacities overlying the right hemidiaphragm. No sign of pleural effusion. Visualized skeletal structures are unremarkable. IMPRESSION: 1. Interstitial and airspace opacities over the right hemidiaphragm likely juxta diaphragmatic atelectasis. Difficult to exclude developing infection. 2. Stable right hemidiaphragmatic elevation. Electronically Signed   By: Zetta Bills M.D.   On: 08/17/2019 09:57   DG Shoulder Right  Result Date: 08/17/2019 CLINICAL DATA:  Right shoulder pain EXAM: RIGHT SHOULDER - 2+ VIEW COMPARISON:  None. FINDINGS: Frontal and transscapular views of the right shoulder are obtained. No displaced fracture. Alignment is anatomic. Mild hypertrophic changes of the acromioclavicular  joint. Chronic scarring throughout the right lung. IMPRESSION: 1. Mild acromioclavicular joint arthritis.  No acute fracture. Electronically Signed   By: Randa Ngo M.D.   On: 08/17/2019 00:54   DG CHEST PORT 1 VIEW  Result Date: 09/06/2019 CLINICAL DATA:  Shortness of breath. EXAM: PORTABLE CHEST 1 VIEW COMPARISON:  09/03/2019 FINDINGS: Single lead pacer. Prior median sternotomy. Moderate right hemidiaphragm elevation. Mild cardiomegaly. Atherosclerosis in the transverse aorta. Suspect a small layering left pleural effusion. No pneumothorax. Pulmonary venous congestion is new or increased. Left perihilar interstitial and airspace disease is new. Left lower lobe airspace disease is progressive. Persistent right base atelectasis. IMPRESSION: Worsened aeration, with new or progressive pulmonary venous congestion. Left perihilar airspace and interstitial opacity could represent alveolar edema or infection. Worsened left and similar right base airspace disease with similar small left pleural effusion. Aortic Atherosclerosis (ICD10-I70.0). Electronically Signed   By: Abigail Miyamoto M.D.    On: 09/06/2019 10:49   DG Chest Port 1 View  Result Date: 09/01/2019 CLINICAL DATA:  Shortness of breath EXAM: PORTABLE CHEST 1 VIEW COMPARISON:  08/20/2019 FINDINGS: Cardiac shadow is stable. Postsurgical changes are noted. Pacemaker is again noted and stable. Aortic calcifications are again seen. Elevation of the right hemidiaphragm is noted. Small left pleural effusion and left basilar atelectasis is noted. Mild vascular congestion remains. No bony abnormality is seen. IMPRESSION: Mild vascular congestion with small left pleural effusion left basilar atelectasis. Electronically Signed   By: Inez Catalina M.D.   On: 09/01/2019 18:57   DG CHEST PORT 1 VIEW  Result Date: 08/20/2019 CLINICAL DATA:  Dyspnea EXAM: PORTABLE CHEST 1 VIEW COMPARISON:  08/17/2019 chest radiograph. FINDINGS: Stable configuration of single lead left subclavian pacemaker. Intact sternotomy wires. Stable cardiomediastinal silhouette with borderline mild cardiomegaly. No pneumothorax. Stable prominent elevation of the right hemidiaphragm. Borderline mild pulmonary edema. Mild blunting of the costophrenic angles bilaterally with mild bibasilar atelectasis. IMPRESSION: 1. Borderline mild congestive heart failure. 2. Mild bibasilar atelectasis with possible small bilateral pleural effusions. 3. Chronic elevation of the right hemidiaphragm. Electronically Signed   By: Ilona Sorrel M.D.   On: 08/20/2019 13:08   DG Knee Complete 4 Views Left  Result Date: 08/17/2019 CLINICAL DATA:  Knee pain EXAM: LEFT KNEE - COMPLETE 4+ VIEW COMPARISON:  01/24/2018 FINDINGS: Vascular calcifications. Faint joint space calcification. No fracture or malalignment. Minimal patellofemoral degenerative change. Small effusion IMPRESSION: 1. No acute osseous abnormality 2. Trace effusion 3. Chondrocalcinosis.  Minimal patellofemoral degenerative change Electronically Signed   By: Donavan Foil M.D.   On: 08/17/2019 00:55   ECHOCARDIOGRAM COMPLETE  Result Date:  09/03/2019    ECHOCARDIOGRAM REPORT   Patient Name:   ZYIERE BODKINS Date of Exam: 09/02/2019 Medical Rec #:  [id number]    Height:       68.0 in Accession #:    [id number]   Weight:       149.5 lb Date of Birth:  [birth year]    BSA:          1.806 m Patient Age:    72 years     BP:           138/55 mmHg Patient Gender: M            HR:           63 bpm. Exam Location:  Inpatient Procedure: 2D Echo, Cardiac Doppler and Color Doppler Indications:    Pleural effusion  History:        Patient  has prior history of Echocardiogram examinations, most                 recent 11/25/2007. CAD, Prior CABG, Arrythmias:Atrial                 Fibrillation; Risk Factors:Dyslipidemia and Hypertension. CKD.  Sonographer:    Clayton Lefort RDCS (AE) Referring Phys: [id number] EMILY B MULLEN  Sonographer Comments: No subcostal window. Image acquisition challenging due to respiratory motion. IMPRESSIONS  1. Left ventricular ejection fraction, by estimation, is 65 to 70%. The left ventricle has hyperdynamic function. The left ventricle has no regional wall motion abnormalities. There is moderate concentric left ventricular hypertrophy. Left ventricular diastolic parameters are consistent with Grade II diastolic dysfunction (pseudonormalization). Elevated left atrial pressure.  2. Right ventricular systolic function is normal. The right ventricular size is normal. There is severely elevated pulmonary artery systolic pressure. The estimated right ventricular systolic pressure is 123456 mmHg.  3. Left atrial size was moderately dilated.  4. Right atrial size was mildly dilated.  5. There is no evidence of cardiac tamponade. Large pleural effusion in the left lateral region.  6. The mitral valve is normal in structure. Mild mitral valve regurgitation. No evidence of mitral stenosis.  7. Tricuspid valve regurgitation is moderate to severe.  8. The aortic valve is tricuspid. Aortic valve regurgitation is trivial. Mild aortic valve stenosis.  9. Pulmonic valve  regurgitation is moderate. 10. The inferior vena cava is normal in size with greater than 50% respiratory variability, suggesting right atrial pressure of 3 mmHg. FINDINGS  Left Ventricle: Left ventricular ejection fraction, by estimation, is 65 to 70%. The left ventricle has hyperdynamic function. The left ventricle has no regional wall motion abnormalities. The left ventricular internal cavity size was normal in size. There is moderate concentric left ventricular hypertrophy. Left ventricular diastolic parameters are consistent with Grade II diastolic dysfunction (pseudonormalization). Elevated left atrial pressure. Right Ventricle: The right ventricular size is normal. No increase in right ventricular wall thickness. Right ventricular systolic function is normal. There is severely elevated pulmonary artery systolic pressure. The tricuspid regurgitant velocity is 3.92 m/s, and with an assumed right atrial pressure of 3 mmHg, the estimated right ventricular systolic pressure is 123456 mmHg. Left Atrium: Left atrial size was moderately dilated. Right Atrium: Right atrial size was mildly dilated. Pericardium: Trivial pericardial effusion is present. There is no evidence of cardiac tamponade. Mitral Valve: The mitral valve is normal in structure. Normal mobility of the mitral valve leaflets. Mild mitral valve regurgitation. No evidence of mitral valve stenosis. Tricuspid Valve: The tricuspid valve is normal in structure. Tricuspid valve regurgitation is moderate to severe. No evidence of tricuspid stenosis. Aortic Valve: The aortic valve is tricuspid. . There is severe thickening and moderate calcification of the aortic valve. Aortic valve regurgitation is trivial. Mild aortic stenosis is present. There is severe thickening of the aortic valve. There is moderate calcification of the aortic valve. Aortic valve mean gradient measures 11.8 mmHg. Aortic valve peak gradient measures 22.2 mmHg. Aortic valve area, by VTI  measures 1.50 cm. Pulmonic Valve: The pulmonic valve was normal in structure. Pulmonic valve regurgitation is moderate. No evidence of pulmonic stenosis. Aorta: The aortic root is normal in size and structure. Venous: The inferior vena cava is normal in size with greater than 50% respiratory variability, suggesting right atrial pressure of 3 mmHg. IAS/Shunts: No atrial level shunt detected by color flow Doppler. Additional Comments: A pacer wire is visualized. There is a large  pleural effusion in the left lateral region.  LEFT VENTRICLE PLAX 2D LVIDd:         4.70 cm LVIDs:         2.25 cm LV PW:         1.20 cm LV IVS:        1.60 cm LVOT diam:     2.20 cm LV SV:         68 LV SV Index:   37 LVOT Area:     3.80 cm  RIGHT VENTRICLE RV Basal diam:  2.20 cm RV S prime:     6.98 cm/s TAPSE (M-mode): 1.0 cm LEFT ATRIUM           Index       RIGHT ATRIUM           Index LA diam:      4.50 cm 2.49 cm/m  RA Area:     18.60 cm LA Vol (A4C): 89.4 ml 49.51 ml/m RA Volume:   42.30 ml  23.42 ml/m  AORTIC VALVE AV Area (Vmax):    1.52 cm AV Area (Vmean):   1.46 cm AV Area (VTI):     1.50 cm AV Vmax:           235.75 cm/s AV Vmean:          159.250 cm/s AV VTI:            0.452 m AV Peak Grad:      22.2 mmHg AV Mean Grad:      11.8 mmHg LVOT Vmax:         94.20 cm/s LVOT Vmean:        61.100 cm/s LVOT VTI:          0.178 m LVOT/AV VTI ratio: 0.39  AORTA Ao Root diam: 3.50 cm MR Peak grad:    118.8 mmHg  TRICUSPID VALVE MR Mean grad:    68.0 mmHg   TR Peak grad:   61.5 mmHg MR Vmax:         545.00 cm/s TR Vmax:        392.00 cm/s MR Vmean:        370.0 cm/s MR PISA:         3.08 cm    SHUNTS MR PISA Eff ROA: 17 mm      Systemic VTI:  0.18 m MR PISA Radius:  0.70 cm     Systemic Diam: 2.20 cm Ena Dawley MD Electronically signed by Ena Dawley MD Signature Date/Time: 09/03/2019/1:16:29 PM    Final    US Abdomen Limited RUQ  Result Date: 09/03/2019 CLINICAL DATA:  Elevated LFTs. EXAM: ULTRASOUND ABDOMEN LIMITED  RIGHT UPPER QUADRANT COMPARISON:  None. FINDINGS: Exam somewhat limited due to patient unable to tolerate changing positions for optimal scanning. Gallbladder: Previous cholecystectomy. Common bile duct: Not visualized. Liver: No focal lesion identified. Within normal limits in parenchymal echogenicity. Portal vein is patent on color Doppler imaging with normal direction of blood flow towards the liver. Other: None. IMPRESSION: Previous cholecystectomy.  No acute findings. Electronically Signed   By: Marin Olp M.D.   On: 09/03/2019 10:54    Microbiology: Recent Results (from the past 240 hour(s))  Blood culture (routine x 2)     Status: None   Collection Time: 09/01/19  6:41 PM   Specimen: BLOOD  Result Value Ref Range Status   Specimen Description BLOOD RIGHT ANTECUBITAL  Final   Special Requests   Final  BOTTLES DRAWN AEROBIC AND ANAEROBIC Blood Culture adequate volume   Culture   Final    NO GROWTH 5 DAYS Performed at [facility], [place] [address], [place], [place] [address]    Report Status 09/06/2019 FINAL  Final  Blood culture (routine x 2)     Status: None   Collection Time: 09/01/19  6:41 PM   Specimen: BLOOD LEFT HAND  Result Value Ref Range Status   Specimen Description BLOOD LEFT HAND  Final   Special Requests   Final    BOTTLES DRAWN AEROBIC AND ANAEROBIC Blood Culture results may not be optimal due to an inadequate volume of blood received in culture bottles   Culture   Final    NO GROWTH 5 DAYS Performed at [facility], [place] [address] [place], [place] [address]    Report Status 09/06/2019 FINAL  Final  Respiratory Panel by RT PCR (Flu A&B, Covid) - Nasopharyngeal Swab     Status: None   Collection Time: 09/01/19  7:10 PM   Specimen: Nasopharyngeal Swab  Result Value Ref Range Status   SARS Coronavirus 2 by RT PCR NEGATIVE NEGATIVE Final    Comment: (NOTE) SARS-CoV-2 target nucleic acids are NOT DETECTED. The SARS-CoV-2 RNA is generally detectable  in upper respiratoy specimens during the acute phase of infection. The lowest concentration of SARS-CoV-2 viral copies this assay can detect is 131 copies/mL. A negative result does not preclude SARS-Cov-2 infection and should not be used as the sole basis for treatment or other patient management decisions. A negative result may occur with  improper specimen collection/handling, submission of specimen other than nasopharyngeal swab, presence of viral mutation(s) within the areas targeted by this assay, and inadequate number of viral copies (<131 copies/mL). A negative result must be combined with clinical observations, patient history, and epidemiological information. The expected result is Negative. Fact Sheet for Patients:  PinkCheek.be Fact Sheet for Healthcare Providers:  GravelBags.it This test is not yet ap proved or cleared by the [place] FDA and  has been authorized for detection and/or diagnosis of SARS-CoV-2 by FDA under an Emergency Use Authorization (EUA). This EUA will remain  in effect (meaning this test can be used) for the duration of the COVID-19 declaration under Section 564(b)(1) of the Act, 21 U.S.C. section 360bbb-3(b)(1), unless the authorization is terminated or revoked sooner.    Influenza A by PCR NEGATIVE NEGATIVE Final   Influenza B by PCR NEGATIVE NEGATIVE Final    Comment: (NOTE) The Xpert Xpress SARS-CoV-2/FLU/RSV assay is intended as an aid in  the diagnosis of influenza from Nasopharyngeal swab specimens and  should not be used as a sole basis for treatment. Nasal washings and  aspirates are unacceptable for Xpert Xpress SARS-CoV-2/FLU/RSV  testing. Fact Sheet for Patients: PinkCheek.be Fact Sheet for Healthcare Providers: GravelBags.it This test is not yet approved or cleared by the [place] FDA and  has been authorized for  detection and/or diagnosis of SARS-CoV-2 by  FDA under an Emergency Use Authorization (EUA). This EUA will remain  in effect (meaning this test can be used) for the duration of the  Covid-19 declaration under Section 564(b)(1) of the Act, 21  U.S.C. section 360bbb-3(b)(1), unless the authorization is  terminated or revoked. Performed at [facility], [place] [address], [place], [place] [address]      Labs: CBC: Recent Labs  Lab 09/01/19 [id number] 09/01/19 [id number] 09/02/19 0505 09/03/19 [id number] 09/04/19 [id number] 09/05/19 [id number] 09/06/19 0533  WBC 16.5*   < > 15.7* 18.0* 19.0* 17.7* 12.7*  NEUTROABS 14.6*  --   --   --   --   --   --   HGB 14.2   < > 12.6* 11.6* 11.7* 11.5* 11.9*  HCT 44.1   < > 38.3* 35.4* 35.2* 34.8* 35.6*  MCV 95.2   < > 94.6 93.4 94.4 93.3 92.7  PLT 263   < > 211 189 176 200 189   < > = values in this interval not displayed.   Basic Metabolic Panel: Recent Labs  Lab 09/03/19 0528 09/03/19 0528 09/03/19 1625 09/04/19 0533 09/05/19 0436 09/06/19 0533 09/07/19 0733  NA 137  --   --  137 133* 135 136  K 2.8*   < > 4.1 3.9 3.9 3.9 3.4*  CL 95*  --   --  97* 96* 95* 96*  CO2 27  --   --  30 27 26 30   GLUCOSE 119*  --   --  132* 139* 117* 110*  BUN 33*  --   --  46* 64* 73* 63*  CREATININE 1.67*  --   --  1.65* 1.73* 1.62* 1.49*  CALCIUM 8.7*  --   --  8.9 9.1 9.2 9.0  MG 1.3*  --   --  1.9 1.8 1.9  --   PHOS  --   --   --  3.5  --  3.7  --    < > = values in this interval not displayed.   Liver Function Tests: Recent Labs  Lab 09/01/19 1841 09/03/19 0528 09/04/19 0533 09/06/19 0533  AST 21 21 18 21   ALT 26 18 18 21   ALKPHOS 84 67 69 74  BILITOT 2.1* 3.4* 2.5* 1.8*  PROT 6.7 5.8* 5.7* 5.8*  ALBUMIN 3.1* 2.4* 2.3* 2.3*   No results for input(s): LIPASE, AMYLASE in the last 168 hours. No results for input(s): AMMONIA in the last 168 hours. Cardiac Enzymes: No results for input(s): CKTOTAL, CKMB, CKMBINDEX, TROPONINI in the last 168 hours. BNP (last 3  results) Recent Labs    08/19/19 0520 09/01/19 1841  BNP 923.1* 860.4*   CBG: No results for input(s): GLUCAP in the last 168 hours.  Time spent: 35 minutes  Signed:  Berle Mull  [facility]  09/08/2019 9:42 AM

## 2019-09-08 NOTE — Progress Notes (Signed)
[facility] ([facility])  Pt has been approved for hospice services at home.  Pt will d/c today via [facility].  [facility] will tentatively hold admission RN for 330 today, should he get home in time.  Updated TOC manger.  Thank you, Venia Carbon RN, BSN, [facility] Liaison

## 2019-09-08 NOTE — TOC Transition Note (Signed)
Transition of Care [facility]) - CM/SW Discharge Note   Patient Details  Name: Jesus Beck MRN: [id number] Date of Birth: 04-29-1926  Transition of Care [facility]) CM/SW Contact:  Zenon Mayo, RN Phone Number: 09/08/2019, 9:46 AM   Clinical Narrative:    Patient is for dc today, [facility] has been called to pik up now. Anderson Malta the daughter has been called, and Freddie Breech with [facility] has been notified. Ambulance forms on unit.   Final next level of care: Home w Hospice Care Barriers to Discharge: No Barriers Identified   Patient Goals and CMS Choice Patient states their goals for this hospitalization and ongoing recovery are:: home with hospice CMS Medicare.gov Compare Post Acute Care list provided to:: Patient Represenative (must comment) Choice offered to / list presented to : Adult Children  Discharge Placement                       Discharge Plan and Services   Discharge Planning Services: CM Consult Post Acute Care Choice: Hospice          DME Arranged: (Hospice will arrange) DME Agency: NA       HH Arranged: RN [place] Agency: ([place] home hosp[facility]) Date HH Agency Contacted: 09/07/19 Time [place]: 1422 Representative spoke with at [place]: Judeen Hammans  Social Determinants of Health ([place]) Interventions     Readmission Risk Interventions No flowsheet data found.

## 2019-09-08 NOTE — Consult Note (Signed)
   [facility] CM Inpatient Consult   09/08/2019  Jesus Beck 02-12-1926 [id number]   Update:  Transitioning to Hospice Care  Chart reviewed and reveals the patient is currently transitioning to Hospice/Palliative Care.   Patient will be followed by [facility].  Given this choice patient will have full case management services through Hospice benefit.  Natividad Brood, RN BSN [facility] Liaison  [phone] business mobile phone Toll free office [phone]  Fax number: [phone] [email or web address]

## 2019-09-08 NOTE — Progress Notes (Signed)
Physical Therapy Treatment Patient Details Name: Jesus Beck MRN: [id number] DOB: January 01, 1926 Today's Date: 09/08/2019    History of Present Illness 86 year old male with history of chronic kidney disease stage IIIb, HLD, HTN, carotid stenosis status post bilateral endarterectomy, CAD with history of CABG, permanent A. fib with PCM in place and anticoagulated on Eliquis came into the hospital with shortness of breath.  He developed difficulties breathing the day of admission while working with occupational therapy.  He was found to be hypoxic with oxygen level at 78%, and he was placed on nonrebreather by EMS.  He was recently admitted to the hospital 4/15-4/24 gout attack in his left knee as well as pneumonia, was sent home on oxygen, prednisone and antibiotics.    PT Comments    Pt admitted with above diagnosis. Pt was able to pivot to recliner with min to mod assist and cues with RW.  Pt sats not registering well therefore got new pulse ox probe. Pt on 3L and O2 100% once new probe on.  Fitted pt for gait belt for home.  Nurse came in to get pt ready for home.  Pt  Going home today. Pt currently with functional limitations due to balance and endurance deficits. Pt will benefit from skilled PT to increase their independence and safety with mobility to allow discharge to the venue listed below.    Follow Up Recommendations  Supervision/Assistance - 24 hour;Home health PT(Hospice care per chart, unsure if pt can get HHPT)     Equipment Recommendations  Wheelchair cushion (measurements PT);Wheelchair (measurements PT);Hospital bed    Recommendations for Other Services       Precautions / Restrictions Precautions Precautions: Fall;Other (comment) Precaution Comments: watch sats Restrictions Weight Bearing Restrictions: No    Mobility  Bed Mobility Overal bed mobility: Needs Assistance Bed Mobility: Supine to Sit     Supine to sit: HOB elevated;Mod assist     General bed mobility  comments: mod assist to come to EOB with pt reaching for PT hands.   Transfers Overall transfer level: Needs assistance Equipment used: Rolling walker (2 wheeled) Transfers: Sit to/from [facility] Sit to Stand: Mod assist;+2 physical assistance;From elevated surface Stand pivot transfers: Mod assist;Min assist;+2 physical assistance;From elevated surface       General transfer comment: Pt stood to RW wiht mod assist to power up.  Able to stand pivot with  mod assist to min assist  with pt taking pivotal steps and just a little unsteady with pivot with posterior lean.   Ambulation/Gait                 Stairs             Wheelchair Mobility    Modified Rankin (Stroke Patients Only)       Balance Overall balance assessment: Needs assistance Sitting-balance support: Feet supported;No upper extremity supported Sitting balance-Leahy Scale: Fair Sitting balance - Comments: static sitting balance with minguard assist, intermittently looses balance posteriorly   Standing balance support: Bilateral upper extremity supported;During functional activity Standing balance-Leahy Scale: Poor Standing balance comment: reliant on UE support on RW and external support                            Cognition Arousal/Alertness: Awake/alert Behavior During Therapy: WFL for tasks assessed/performed Overall Cognitive Status: No family/caregiver present to determine baseline cognitive functioning Area of Impairment: Memory;Awareness;Problem solving;Safety/judgement;Orientation  Orientation Level: Disoriented to;Situation;Time Current Attention Level: Sustained Memory: Decreased short-term memory   Safety/Judgement: Decreased awareness of safety Awareness: Emergent Problem Solving: Difficulty sequencing;Requires verbal cues General Comments: pt required frequent and consistent cues during mobility;pt with slow processing       Exercises General Exercises - Lower Extremity Ankle Circles/Pumps: AROM;Both;10 reps;Supine Long Arc Quad: AROM;Both;10 reps;Seated Hip Flexion/Marching: AROM;Both;10 reps;Seated    General Comments General comments (skin integrity, edema, etc.): HR 68 bpm, O2 sats not reading well due to probe old and not sticking and fingers cold. once pt in chair O2 sats 100% on 3L with a new probe.       Pertinent Vitals/Pain Pain Assessment: Faces Faces Pain Scale: Hurts even more Pain Location: back and bilateral ankle pain, knees Pain Descriptors / Indicators: Guarding Pain Intervention(s): Limited activity within patient's tolerance;Monitored during session;Repositioned;Premedicated before session    Home Living                      Prior Function            PT Goals (current goals can now be found in the care plan section) Acute Rehab PT Goals Patient Stated Goal: home Progress towards PT goals: Progressing toward goals    Frequency    Min 3X/week      PT Plan Discharge plan needs to be updated    Co-evaluation              AM-PAC PT "6 Clicks" Mobility   Outcome Measure  Help needed turning from your back to your side while in a flat bed without using bedrails?: None Help needed moving from lying on your back to sitting on the side of a flat bed without using bedrails?: A Little Help needed moving to and from a bed to a chair (including a wheelchair)?: A Lot Help needed standing up from a chair using your arms (e.g., wheelchair or bedside chair)?: A Lot Help needed to walk in hospital room?: Total Help needed climbing 3-5 steps with a railing? : Total 6 Click Score: 13    End of Session Equipment Utilized During Treatment: Gait belt;Oxygen Activity Tolerance: Patient limited by fatigue Patient left: with call bell/phone within reach;in chair;with chair alarm set Nurse Communication: Mobility status PT Visit Diagnosis: Unsteadiness on feet  (R26.81);Muscle weakness (generalized) (M62.81);Difficulty in walking, not elsewhere classified (R26.2);Pain Pain - Right/Left: (bilateral) Pain - part of body: Knee     Time: 0950-1010 PT Time Calculation (min) (ACUTE ONLY): 20 min  Charges:  $Therapeutic Activity: 8-22 mins                     Lynsee Wands W,PT Acute Rehabilitation Services Pager:  [phone]  Office:  [place] 09/08/2019, 10:32 AM

## 2019-11-02 DEATH — deceased

## 2020-09-13 IMAGING — DX DG CHEST 1V PORT
1 series · 1 of 1 positions shown · non-contrast
Comparison: 08/17/2019 chest radiograph.

CLINICAL DATA: Dyspnea

EXAM:
PORTABLE CHEST 1 VIEW

[chest]
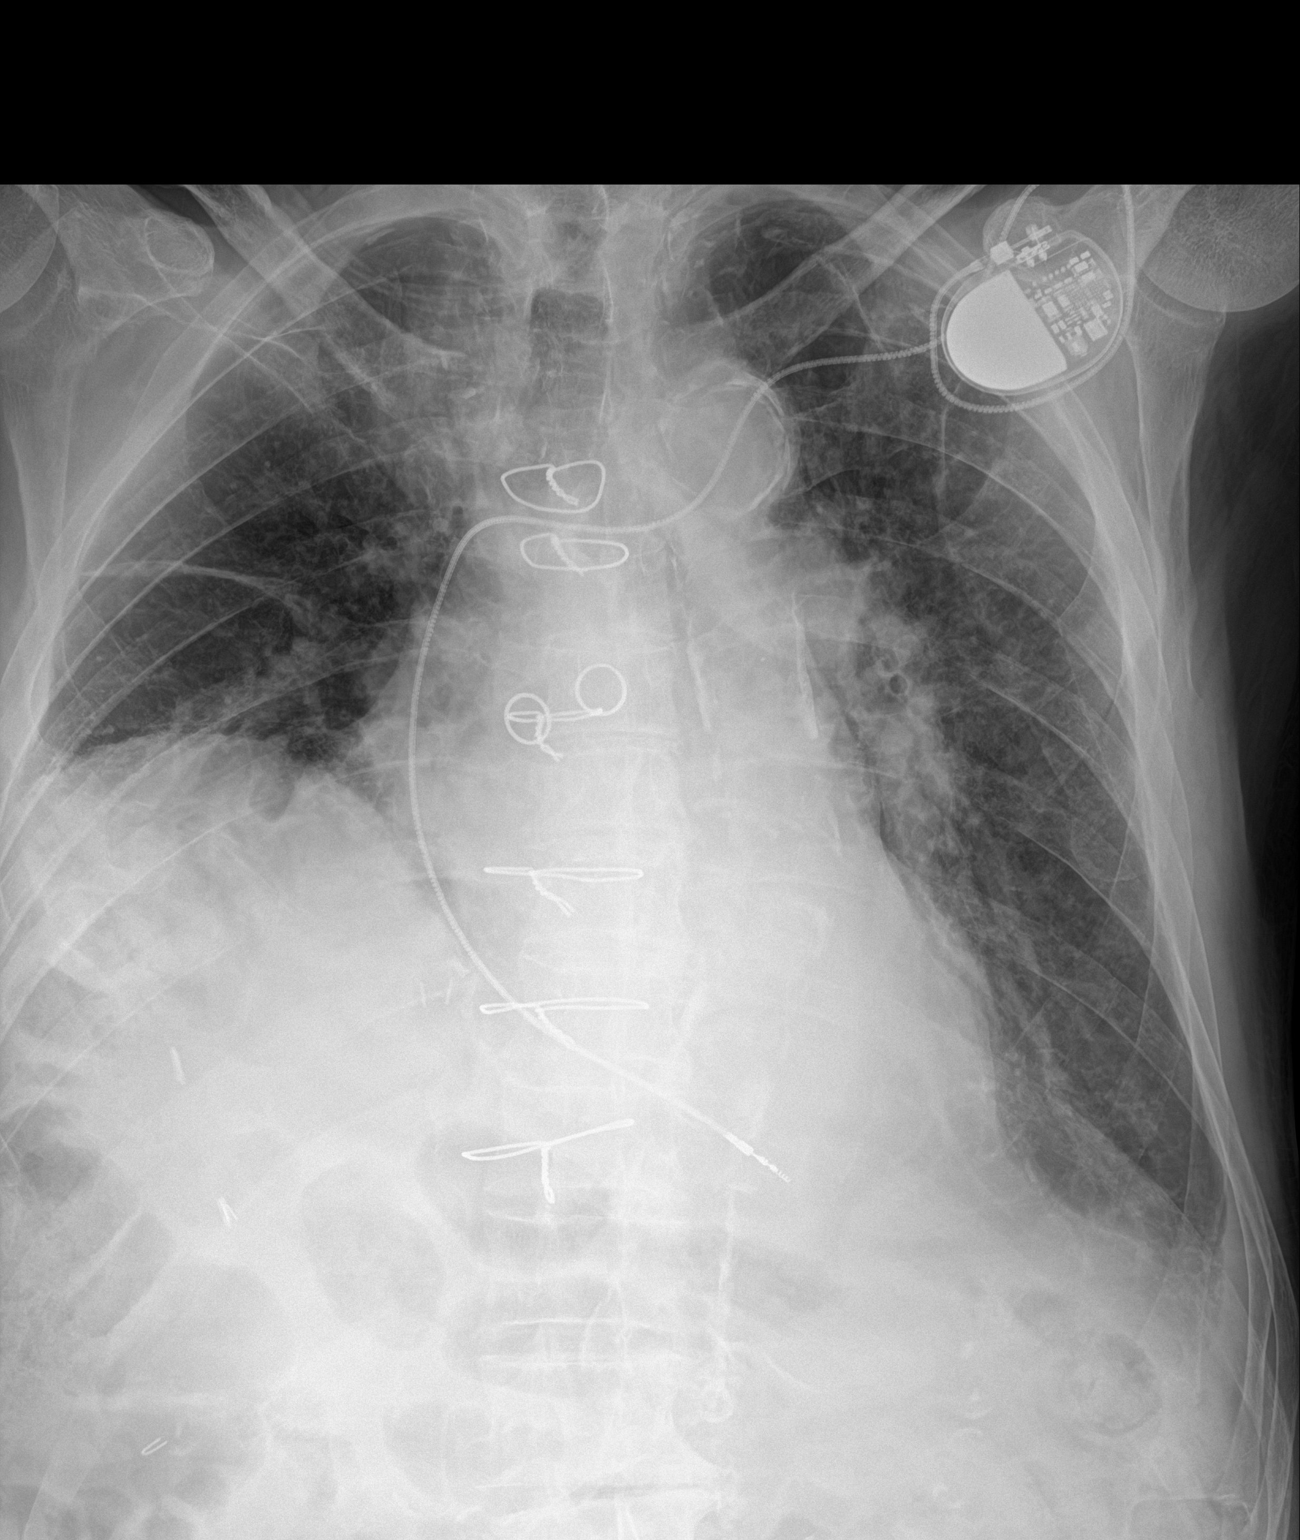

[1 of 1 positions shown; findings below may reference images not displayed]

FINDINGS: Stable configuration of single lead left subclavian pacemaker.
Intact sternotomy wires. Stable cardiomediastinal silhouette with
borderline mild cardiomegaly. No pneumothorax. Stable prominent
elevation of the right hemidiaphragm. Borderline mild pulmonary
edema. Mild blunting of the costophrenic angles bilaterally with
mild bibasilar atelectasis.
IMPRESSION: 1. Borderline mild congestive heart failure.
2. Mild bibasilar atelectasis with possible small bilateral pleural
effusions.
3. Chronic elevation of the right hemidiaphragm.
